# Patient Record
Sex: Female | Born: 1950 | Race: White | Hispanic: No | Marital: Married | Smoking: Former smoker
Health system: Southern US, Community
[De-identification: ages and names within clinical notes are randomized; demographics above are authoritative.]

## PROBLEM LIST (undated history)

## (undated) DIAGNOSIS — I10 Essential (primary) hypertension: Secondary | ICD-10-CM

## (undated) DIAGNOSIS — I456 Pre-excitation syndrome: Secondary | ICD-10-CM

## (undated) DIAGNOSIS — K649 Unspecified hemorrhoids: Secondary | ICD-10-CM

## (undated) DIAGNOSIS — K5900 Constipation, unspecified: Secondary | ICD-10-CM

## (undated) HISTORY — DX: Pre-excitation syndrome: I45.6

## (undated) HISTORY — DX: Essential (primary) hypertension: I10

## (undated) HISTORY — PX: COLONOSCOPY: SHX174

---

## 1992-08-04 HISTORY — PX: OTHER SURGICAL HISTORY: SHX169

## 2001-08-27 ENCOUNTER — Encounter: Admission: RE | Admit: 2001-08-27 | Discharge: 2001-09-09 | Payer: Self-pay | Admitting: Internal Medicine

## 2003-05-08 ENCOUNTER — Encounter: Payer: Self-pay | Admitting: Internal Medicine

## 2004-10-23 ENCOUNTER — Ambulatory Visit: Payer: Self-pay | Admitting: Internal Medicine

## 2004-11-18 ENCOUNTER — Ambulatory Visit: Payer: Self-pay | Admitting: Internal Medicine

## 2004-12-31 ENCOUNTER — Ambulatory Visit: Payer: Self-pay | Admitting: Gastroenterology

## 2005-01-21 ENCOUNTER — Ambulatory Visit: Payer: Self-pay | Admitting: Gastroenterology

## 2005-01-21 ENCOUNTER — Encounter: Payer: Self-pay | Admitting: Internal Medicine

## 2005-11-17 ENCOUNTER — Ambulatory Visit: Payer: Self-pay | Admitting: Internal Medicine

## 2005-11-24 ENCOUNTER — Ambulatory Visit: Payer: Self-pay | Admitting: Internal Medicine

## 2006-01-26 ENCOUNTER — Ambulatory Visit: Payer: Self-pay | Admitting: Internal Medicine

## 2006-08-14 ENCOUNTER — Ambulatory Visit: Payer: Self-pay | Admitting: Internal Medicine

## 2006-08-14 LAB — CONVERTED CEMR LAB
ALT: 33 units/L (ref 0–40)
AST: 28 units/L (ref 0–37)
Albumin: 4.1 g/dL (ref 3.5–5.2)
Alkaline Phosphatase: 78 units/L (ref 39–117)
BUN: 11 mg/dL (ref 6–23)
Basophils Absolute: 0 10*3/uL (ref 0.0–0.1)
Basophils Relative: 0.5 % (ref 0.0–1.0)
CO2: 30 meq/L (ref 19–32)
Calcium: 9.6 mg/dL (ref 8.4–10.5)
Chloride: 107 meq/L (ref 96–112)
Chol/HDL Ratio, serum: 4
Cholesterol: 230 mg/dL (ref 0–200)
Creatinine, Ser: 0.7 mg/dL (ref 0.4–1.2)
Eosinophil percent: 2.5 % (ref 0.0–5.0)
GFR calc non Af Amer: 92 mL/min
Glomerular Filtration Rate, Af Am: 112 mL/min/{1.73_m2}
Glucose, Bld: 96 mg/dL (ref 70–99)
HCT: 47 % — ABNORMAL HIGH (ref 36.0–46.0)
HDL: 56.9 mg/dL (ref >39.0)
Hemoglobin: 14.8 g/dL (ref 12.0–15.0)
LDL DIRECT: 143.6 mg/dL
Lymphocytes Relative: 42.8 % (ref 12.0–46.0)
MCHC: 31.4 g/dL (ref 30.0–36.0)
MCV: 89.2 fL (ref 78.0–100.0)
Monocytes Absolute: 0.3 10*3/uL (ref 0.2–0.7)
Monocytes Relative: 9 % (ref 3.0–11.0)
Neutro Abs: 1.7 10*3/uL (ref 1.4–7.7)
Neutrophils Relative %: 45.2 % (ref 43.0–77.0)
Platelets: 254 10*3/uL (ref 150–400)
Potassium: 3.8 meq/L (ref 3.5–5.1)
RBC: 5.27 M/uL — ABNORMAL HIGH (ref 3.87–5.11)
RDW: 12 % (ref 11.5–14.6)
Sodium: 143 meq/L (ref 135–145)
TSH: 1.1 microintl units/mL (ref 0.35–5.50)
Total Bilirubin: 1 mg/dL (ref 0.3–1.2)
Total Protein: 6.8 g/dL (ref 6.0–8.3)
Triglyceride fasting, serum: 68 mg/dL (ref 0–149)
VLDL: 14 mg/dL (ref 0–40)
WBC: 3.6 10*3/uL — ABNORMAL LOW (ref 4.5–10.5)

## 2006-08-21 ENCOUNTER — Ambulatory Visit: Payer: Self-pay | Admitting: Internal Medicine

## 2006-10-05 ENCOUNTER — Ambulatory Visit: Payer: Self-pay | Admitting: Internal Medicine

## 2007-01-15 ENCOUNTER — Encounter: Payer: Self-pay | Admitting: Internal Medicine

## 2007-01-22 ENCOUNTER — Ambulatory Visit: Payer: Self-pay | Admitting: Internal Medicine

## 2007-01-28 ENCOUNTER — Ambulatory Visit: Payer: Self-pay | Admitting: Internal Medicine

## 2007-01-28 LAB — CONVERTED CEMR LAB
BUN: 10 mg/dL (ref 6–23)
CO2: 31 meq/L (ref 19–32)
Calcium: 9.9 mg/dL (ref 8.4–10.5)
Chloride: 102 meq/L (ref 96–112)
Creatinine, Ser: 0.6 mg/dL (ref 0.4–1.2)
GFR calc Af Amer: 133 mL/min
GFR calc non Af Amer: 110 mL/min
Glucose, Bld: 76 mg/dL (ref 70–99)
Potassium: 3.9 meq/L (ref 3.5–5.1)
Sodium: 141 meq/L (ref 135–145)

## 2007-02-01 DIAGNOSIS — I10 Essential (primary) hypertension: Secondary | ICD-10-CM

## 2007-02-03 DIAGNOSIS — I456 Pre-excitation syndrome: Secondary | ICD-10-CM | POA: Insufficient documentation

## 2007-04-19 ENCOUNTER — Ambulatory Visit: Payer: Self-pay | Admitting: Internal Medicine

## 2007-04-26 ENCOUNTER — Ambulatory Visit: Payer: Self-pay | Admitting: Internal Medicine

## 2007-04-26 LAB — CONVERTED CEMR LAB
Bilirubin Urine: NEGATIVE
Blood in Urine, dipstick: NEGATIVE
Glucose, Urine, Semiquant: NEGATIVE
Ketones, urine, test strip: NEGATIVE
Nitrite: NEGATIVE
Protein, U semiquant: NEGATIVE
Specific Gravity, Urine: 1.015
Urobilinogen, UA: 0.2
WBC Urine, dipstick: NEGATIVE
pH: 7

## 2007-04-29 ENCOUNTER — Ambulatory Visit: Payer: Self-pay | Admitting: Cardiology

## 2007-05-05 ENCOUNTER — Telehealth: Payer: Self-pay | Admitting: Internal Medicine

## 2007-05-06 ENCOUNTER — Telehealth: Payer: Self-pay | Admitting: Internal Medicine

## 2007-05-17 ENCOUNTER — Telehealth: Payer: Self-pay | Admitting: Internal Medicine

## 2007-05-17 ENCOUNTER — Encounter: Admission: RE | Admit: 2007-05-17 | Discharge: 2007-05-17 | Payer: Self-pay | Admitting: Internal Medicine

## 2007-05-20 ENCOUNTER — Telehealth: Payer: Self-pay | Admitting: Internal Medicine

## 2007-05-24 ENCOUNTER — Telehealth: Payer: Self-pay | Admitting: Internal Medicine

## 2007-05-31 ENCOUNTER — Ambulatory Visit: Payer: Self-pay | Admitting: Internal Medicine

## 2007-06-04 ENCOUNTER — Encounter: Payer: Self-pay | Admitting: Internal Medicine

## 2007-06-07 ENCOUNTER — Encounter: Admission: RE | Admit: 2007-06-07 | Discharge: 2007-06-07 | Payer: Self-pay | Admitting: Internal Medicine

## 2007-06-09 ENCOUNTER — Telehealth: Payer: Self-pay | Admitting: Internal Medicine

## 2007-06-14 ENCOUNTER — Telehealth: Payer: Self-pay | Admitting: Internal Medicine

## 2007-10-20 ENCOUNTER — Ambulatory Visit: Payer: Self-pay | Admitting: Internal Medicine

## 2007-10-20 LAB — CONVERTED CEMR LAB
ALT: 19 units/L (ref 0–35)
AST: 22 units/L (ref 0–37)
Albumin: 4.3 g/dL (ref 3.5–5.2)
BUN: 13 mg/dL (ref 6–23)
Basophils Absolute: 0 10*3/uL (ref 0.0–0.1)
Bilirubin Urine: NEGATIVE
Chloride: 101 meq/L (ref 96–112)
Creatinine, Ser: 0.7 mg/dL (ref 0.4–1.2)
Direct LDL: 142.4 mg/dL
Eosinophils Absolute: 0.1 10*3/uL (ref 0.0–0.6)
GFR calc non Af Amer: 92 mL/min
Glucose, Bld: 91 mg/dL (ref 70–99)
HCT: 43.3 % (ref 36.0–46.0)
Hemoglobin: 14.5 g/dL (ref 12.0–15.0)
Ketones, urine, test strip: NEGATIVE
MCV: 88.3 fL (ref 78.0–100.0)
Monocytes Relative: 8.6 % (ref 3.0–11.0)
Nitrite: NEGATIVE
Potassium: 4.3 meq/L (ref 3.5–5.1)
Protein, U semiquant: NEGATIVE
RBC: 4.9 M/uL (ref 3.87–5.11)
Specific Gravity, Urine: 1.01
WBC Urine, dipstick: NEGATIVE
WBC: 3.4 10*3/uL — ABNORMAL LOW (ref 4.5–10.5)

## 2007-10-27 ENCOUNTER — Ambulatory Visit: Payer: Self-pay | Admitting: Internal Medicine

## 2008-02-10 ENCOUNTER — Encounter: Payer: Self-pay | Admitting: Internal Medicine

## 2008-04-17 ENCOUNTER — Telehealth: Payer: Self-pay | Admitting: Internal Medicine

## 2008-07-26 ENCOUNTER — Ambulatory Visit: Payer: Self-pay | Admitting: Internal Medicine

## 2008-07-26 LAB — CONVERTED CEMR LAB
Protein, U semiquant: NEGATIVE
Urobilinogen, UA: 0.2
pH: 7

## 2008-07-27 ENCOUNTER — Encounter: Payer: Self-pay | Admitting: Internal Medicine

## 2008-08-04 LAB — HM PAP SMEAR

## 2008-10-20 ENCOUNTER — Ambulatory Visit: Payer: Self-pay | Admitting: Internal Medicine

## 2008-10-20 LAB — CONVERTED CEMR LAB
Alkaline Phosphatase: 63 units/L (ref 39–117)
Basophils Absolute: 0 10*3/uL (ref 0.0–0.1)
Bilirubin, Direct: 0 mg/dL (ref 0.0–0.3)
Calcium: 9.4 mg/dL (ref 8.4–10.5)
Cholesterol: 233 mg/dL — ABNORMAL HIGH (ref 0–200)
Direct LDL: 160 mg/dL
Glucose, Bld: 90 mg/dL (ref 70–99)
Glucose, Urine, Semiquant: NEGATIVE
HCT: 42.6 % (ref 36.0–46.0)
Hemoglobin: 14.9 g/dL (ref 12.0–15.0)
Ketones, urine, test strip: NEGATIVE
Lymphocytes Relative: 45.4 % (ref 12.0–46.0)
Lymphs Abs: 1.6 10*3/uL (ref 0.7–4.0)
MCV: 86.4 fL (ref 78.0–100.0)
Monocytes Absolute: 0.3 10*3/uL (ref 0.1–1.0)
Neutro Abs: 1.6 10*3/uL (ref 1.4–7.7)
Neutrophils Relative %: 44.6 % (ref 43.0–77.0)
Nitrite: NEGATIVE
Platelets: 183 10*3/uL (ref 150.0–400.0)
Potassium: 3.6 meq/L (ref 3.5–5.1)
RBC: 4.93 M/uL (ref 3.87–5.11)
TSH: 0.94 microintl units/mL (ref 0.35–5.50)
Total Bilirubin: 1 mg/dL (ref 0.3–1.2)
Total CHOL/HDL Ratio: 4
Total Protein: 6.9 g/dL (ref 6.0–8.3)
Triglycerides: 73 mg/dL (ref 0.0–149.0)

## 2008-10-23 ENCOUNTER — Ambulatory Visit: Payer: Self-pay | Admitting: Internal Medicine

## 2009-01-11 ENCOUNTER — Telehealth: Payer: Self-pay | Admitting: Internal Medicine

## 2009-02-15 ENCOUNTER — Encounter: Payer: Self-pay | Admitting: Internal Medicine

## 2009-03-22 ENCOUNTER — Ambulatory Visit: Payer: Self-pay | Admitting: Internal Medicine

## 2009-08-04 LAB — HM MAMMOGRAPHY: HM Mammogram: NORMAL

## 2009-10-09 ENCOUNTER — Ambulatory Visit: Payer: Self-pay | Admitting: Internal Medicine

## 2009-10-09 LAB — CONVERTED CEMR LAB
AST: 25 units/L (ref 0–37)
BUN: 11 mg/dL (ref 6–23)
Basophils Relative: 1.4 % (ref 0.0–3.0)
Bilirubin Urine: NEGATIVE
Calcium: 9.8 mg/dL (ref 8.4–10.5)
Cholesterol: 232 mg/dL — ABNORMAL HIGH (ref 0–200)
Direct LDL: 157 mg/dL
Eosinophils Absolute: 0.1 10*3/uL (ref 0.0–0.7)
Eosinophils Relative: 2 % (ref 0.0–5.0)
Glucose, Bld: 89 mg/dL (ref 70–99)
HDL: 62 mg/dL (ref >39.00)
Ketones, urine, test strip: NEGATIVE
Lymphocytes Relative: 40.1 % (ref 12.0–46.0)
MCHC: 32.6 g/dL (ref 30.0–36.0)
Monocytes Absolute: 0.3 10*3/uL (ref 0.1–1.0)
Neutro Abs: 1.7 10*3/uL (ref 1.4–7.7)
Neutrophils Relative %: 49.4 % (ref 43.0–77.0)
Platelets: 198 10*3/uL (ref 150.0–400.0)
Potassium: 4.2 meq/L (ref 3.5–5.1)
Total Bilirubin: 0.7 mg/dL (ref 0.3–1.2)
Triglycerides: 110 mg/dL (ref 0.0–149.0)
VLDL: 22 mg/dL (ref 0.0–40.0)
pH: 7

## 2009-10-24 ENCOUNTER — Ambulatory Visit: Payer: Self-pay | Admitting: Internal Medicine

## 2010-01-03 ENCOUNTER — Telehealth: Payer: Self-pay | Admitting: Internal Medicine

## 2010-01-03 ENCOUNTER — Ambulatory Visit: Payer: Self-pay | Admitting: Internal Medicine

## 2010-01-04 ENCOUNTER — Telehealth: Payer: Self-pay | Admitting: Internal Medicine

## 2010-01-24 ENCOUNTER — Ambulatory Visit: Payer: Self-pay | Admitting: Internal Medicine

## 2010-02-18 ENCOUNTER — Encounter: Payer: Self-pay | Admitting: Internal Medicine

## 2010-03-06 ENCOUNTER — Encounter (INDEPENDENT_AMBULATORY_CARE_PROVIDER_SITE_OTHER): Payer: Self-pay | Admitting: *Deleted

## 2010-04-04 ENCOUNTER — Ambulatory Visit: Payer: Self-pay | Admitting: Internal Medicine

## 2010-04-04 DIAGNOSIS — M549 Dorsalgia, unspecified: Secondary | ICD-10-CM | POA: Insufficient documentation

## 2010-04-04 LAB — CONVERTED CEMR LAB
Blood in Urine, dipstick: NEGATIVE
Glucose, Urine, Semiquant: NEGATIVE
Urobilinogen, UA: 0.2
WBC Urine, dipstick: NEGATIVE
pH: 7

## 2010-04-11 ENCOUNTER — Ambulatory Visit: Payer: Self-pay | Admitting: Internal Medicine

## 2010-04-12 ENCOUNTER — Encounter: Payer: Self-pay | Admitting: Internal Medicine

## 2010-05-01 ENCOUNTER — Ambulatory Visit: Payer: Self-pay | Admitting: Internal Medicine

## 2010-05-02 ENCOUNTER — Encounter: Payer: Self-pay | Admitting: Internal Medicine

## 2010-05-14 ENCOUNTER — Encounter: Payer: Self-pay | Admitting: Internal Medicine

## 2010-05-30 ENCOUNTER — Encounter: Payer: Self-pay | Admitting: Internal Medicine

## 2010-06-18 ENCOUNTER — Ambulatory Visit: Payer: Self-pay | Admitting: Internal Medicine

## 2010-08-20 ENCOUNTER — Ambulatory Visit
Admission: RE | Admit: 2010-08-20 | Discharge: 2010-08-20 | Payer: Self-pay | Source: Home / Self Care | Attending: Family Medicine | Admitting: Family Medicine

## 2010-08-21 ENCOUNTER — Ambulatory Visit
Admission: RE | Admit: 2010-08-21 | Discharge: 2010-08-21 | Payer: Self-pay | Source: Home / Self Care | Attending: Family Medicine | Admitting: Family Medicine

## 2010-08-22 ENCOUNTER — Telehealth: Payer: Self-pay | Admitting: Internal Medicine

## 2010-09-01 LAB — CONVERTED CEMR LAB
Pap Smear: NORMAL
Pap Smear: NORMAL

## 2010-09-03 NOTE — Progress Notes (Signed)
Summary: cancel heart monitor  Phone Note Call from Patient Call back at Home Phone [phone]   Summary of Call: Has figured out that she is having panic attacks.  She wants to cancel heart monitor.  Major family issues - husband, dad, son.  Doesn't want strong med, will try to let go of it, exercise, eat right.  Your advice?    Initial call taken by: Rudy Jew, RN,  January 04, 2010 9:05 AM  Follow-up for Phone Call        if sxs are not bothering her, ok to cancel Follow-up by: Birdie Sons MD,  January 04, 2010 10:12 AM  Additional Follow-up for Phone Call Additional follow up Details #1::        She will call cardiology & cancel appointment. Additional Follow-up by: Rudy Jew, RN,  January 04, 2010 10:23 AM

## 2010-09-03 NOTE — Assessment & Plan Note (Signed)
Summary: ?uti/cjr   Vital Signs:  Patient profile:   60 year old female Temp:     98.2 degrees F oral Pulse rate:   72 / minute Pulse rhythm:   regular Resp:     12 per minute BP sitting:   148 / 94  (left arm) Cuff size:   regular  Vitals Entered By: Gladis Riffle, RN (April 04, 2010 9:16 AM) CC: c/o mid back pain that has been better last couple days, lack of energy, and urinary frequency Is Patient Diabetic? No   CC:  c/o mid back pain that has been better last couple days, lack of energy, and and urinary frequency.  History of Present Illness: concerned with uti--- started with back pain---mid low back (duration 2 weeks, intermittent) pain was mild to moderate---she thought MSK 2 days ago she felt poorly, wondered about uti. 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 Once Daily 2)  Lisinopril 5 Mg Tabs (Lisinopril) .... Once Daily 3)  Oscal 500/200 D-3 500-200 Mg-Unit Tabs (Calcium-Vitamin D) .... Two Times A Day 4)  Flax Seed Oil 1000 Mg  Caps (Flaxseed (Linseed)) .... 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   Head:  normocephalic and atraumatic.   Msk:  No deformity or scoliosis noted of thoracic or lumbar spine.   slr negative  Neurologic:  lower ext strength normal dtrs normal---bilateral LLE   Impression & Recommendations:  Problem # 1:  BACK PAIN (ICD-724.5) suspect MSk pain doubt uti but will check culture Orders: T-Culture, Urine ([id number])  Complete Medication List: 1)  Fish Oil 1000 Mg Caps (Omega-3 fatty acids) .... Once daily 2)  Lisinopril 5 Mg Tabs (Lisinopril) .... Once daily 3)  Oscal 500/200 D-3 500-200 Mg-unit Tabs (Calcium-vitamin d) .... Two times a day 4)  Flax Seed Oil 1000 Mg Caps (Flaxseed (linseed)) .... Once daily--takes 1200mg   Other Orders: UA Dipstick w/o Micro (automated)  (81003)  Laboratory Results   Urine Tests    Routine Urinalysis   Color: yellow Appearance: Clear Glucose: negative   (Normal Range: Negative) Bilirubin: negative   (Normal Range: Negative) Ketone: negative   (Normal Range: Negative) Spec. Gravity: 1.010   (Normal Range: 1.003-1.035) Blood: negative   (Normal Range: Negative) pH: 7.0   (Normal Range: 5.0-8.0) Protein: negative   (Normal Range: Negative) Urobilinogen: 0.2   (Normal Range: 0-1) Nitrite: negative   (Normal Range: Negative) Leukocyte Esterace: negative   (Normal Range: Negative)    Comments: Rita Ohara  April 04, 2010 10:13 AM

## 2010-09-03 NOTE — Assessment & Plan Note (Signed)
Summary: cpx/cjr   Vital Signs:  Patient profile:   60 year old female Height:      67.25 inches Weight:      156 pounds BMI:     24.34 Temp:     98.6 degrees F oral Pulse rate:   66 / minute BP sitting:   140 / 94  (left arm) Cuff size:   regular  Vitals Entered By: Kern Reap CMA Duncan Dull) (October 24, 2009 9:05 AM) CC: yearly physical Is Patient Diabetic? No Pain Assessment Patient in pain? no        CC:  yearly physical.  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 Take 1 Once A Day 2)  Lisinopril 5 Mg Tabs (Lisinopril) .... Once Daily 3)  Oscal 500/200 D-3 500-200 Mg-Unit Tabs (Calcium-Vitamin D) .... Two Times A Day 4)  Flax Seed Oil 1000 Mg  Caps (Flaxseed (Linseed)) .... 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   Head:  normocephalic and atraumatic.   Eyes:  pupils equal and pupils round.   Ears:  R ear normal and L ear normal.   Neck:  supple and full ROM.   Chest Wall:  No deformities, masses, or tenderness noted. Lungs:  normal respiratory effort  and no intercostal retractions.   Heart:  normal rate and regular rhythm.   Abdomen:  soft and non-tender.   Msk:  No deformity or scoliosis noted of thoracic or lumbar spine.   Pulses:  R radial normal and L radial normal.   Neurologic:  cranial nerves II-XII intact and gait normal.   Skin:  turgor normal and color normal.   Cervical Nodes:  no anterior cervical adenopathy and no posterior cervical adenopathy.   Psych:  memory intact for recent and remote and good eye contact.     Impression & Recommendations:  Problem # 1:  PREVENTIVE HEALTH CARE (ICD-V70.0) health maint UTD continue to be active  Problem # 2:  WOLFF (WOLFE)-PARKINSON-WHITE (WPW) SYNDROME (ICD-426.7) asymptomatic  Problem # 3:  HYPERTENSION (ICD-401.9) home bps 120/60s Her updated medication list for this problem includes:    Lisinopril 5 Mg Tabs (Lisinopril) ..... Once daily  Orders: EKG w/ Interpretation (93000)  BP today: 140/94 Prior BP: 142/88 (03/22/2009)  Prior 10 Yr Risk Heart Disease: Not enough information (03/22/2009)  Labs Reviewed: K+: 4.2 (10/09/2009) Creat: : 0.7 (10/09/2009)   Chol: 232 (10/09/2009)   HDL: 62.00 (10/09/2009)   LDL: DEL (10/20/2007)   TG: 110.0 (10/09/2009)  Complete Medication List: 1)  Fish Oil 500 Mg Caps (Omega-3 fatty acids) .... Take 1 once a day 2)  Lisinopril 5 Mg Tabs (Lisinopril) .... Once daily 3)  Oscal 500/200 D-3 500-200 Mg-unit Tabs (Calcium-vitamin d) .... Two times a  day 4)  Flax Seed Oil 1000 Mg Caps (Flaxseed (linseed)) .... Once daily--takes 1200mg    Immunization History:  Influenza Immunization History:    Influenza:  historical (06/04/2009)    Preventive Care Screening  Colonoscopy:    Next Due:  02/2015  Mammogram:    Date:  08/04/2009    Next Due:  08/2011    Results:  normal   Pap Smear:    Date:  08/04/2009    Next Due:  08/2012    Results:  normal

## 2010-09-03 NOTE — Assessment & Plan Note (Signed)
Summary: ? heart palpitations--ok per Ellen//ccm   Vital Signs:  Patient profile:   60 year old female Weight:      156 pounds Temp:     98.3 degrees F oral Pulse rate:   76 / minute Pulse rhythm:   regular Resp:     12 per minute BP sitting:   132 / 80  (left arm) Cuff size:   regular  Vitals Entered By: Gladis Riffle, RN (January 03, 2010 11:19 AM) CC: husband with brain tumor in April 2011, other problems with him so stressed--now feels nausea, sweaty and tachycardia on 12/29/09--tachycardic in AM x 1 min but lasting longer lately Is Patient Diabetic? No Comments today weird sensations and feeling of heart acting weird   CC:  husband with brain tumor in April 2011, other problems with him so stressed--now feels nausea, and sweaty and tachycardia on 12/29/09--tachycardic in AM x 1 min but lasting longer lately.  History of Present Illness: pt with known WPW by EKG.  Over the past few months she has noted increased frequency and duration of palpitations. No CP, SOB, PND, orhtopnea.   Preventive Screening-Counseling & Management  Alcohol-Tobacco     Smoking Status: never     Year Quit: 1980  Current Medications (verified): 1)  Fish Oil 1000 Mg Caps (Omega-3 Fatty Acids) .... Once Daily 2)  Lisinopril 5 Mg Tabs (Lisinopril) .... Once Daily 3)  Oscal 500/200 D-3 500-200 Mg-Unit Tabs (Calcium-Vitamin D) .... Two Times A Day 4)  Flax Seed Oil 1000 Mg  Caps (Flaxseed (Linseed)) .... Once Daily--Takes 1200mg   Allergies: 1)  Bactrim (Sulfamethoxazole-Trimethoprim)  Physical Exam  General:  alert and well-developed.   Head:  normocephalic and atraumatic.   Neck:  supple and full ROM.   Chest Wall:  No deformities, masses, or tenderness noted. Lungs:  normal respiratory effort and no intercostal retractions.   Heart:  normal rate and regular rhythm.   Abdomen:  soft and non-tender.     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 Once daily 2)  Lisinopril 5 Mg Tabs (Lisinopril) .... Once daily 3)  Oscal 500/200 D-3 500-200 Mg-unit Tabs (Calcium-vitamin d) .... Two times a day 4)  Flax Seed Oil 1000 Mg Caps (Flaxseed (linseed)) .... Once daily--takes 1200mg 

## 2010-09-03 NOTE — Progress Notes (Signed)
Summary: needs ov today  Phone Note Call from Patient Call back at Home Phone [phone]   Caller: Patient--live call Summary of Call: concerned about  her heart. Wants to be worked in to see Dr Cato Mulligan today. Please advise. Pt has a heart condition called WPW, which causes a rapid heartbeat. Started on last Saturday. Today, it hasn't stopped beating fast. Initial call taken by: Warnell Forester,  January 03, 2010 8:44 AM  Follow-up for Phone Call        per Alvino Chapel. can see Dr Cato Mulligan today @ 11:30 am Pt is aware. Follow-up by: Warnell Forester,  January 03, 2010 9:00 AM

## 2010-09-03 NOTE — Letter (Signed)
Summary: Colonoscopy Letter  [facility]  [address] [place], [place] [address]   Phone: [phone]  Fax: [phone]      March 06, 2010 MRN: [id number]   Christine Livingston [address] [place], [place]  [address]   Dear Ms. Christine Livingston,   According to your medical record, it is time for you to schedule a Colonoscopy. The American Cancer Society recommends this procedure as a method to detect early colon cancer. Patients with a family history of colon cancer, or a personal history of colon polyps or inflammatory bowel disease are at increased risk.  This letter has been generated based on the recommendations made at the time of your procedure. If you feel that in your particular situation this may no longer apply, please contact our office.  Please call our office at [phone] to schedule this appointment or to update your records at your earliest convenience.  Thank you for cooperating with [place] to provide you with the very best care possible.   Sincerely,   Barbette Hair. Arlyce Dice, M.D.  [facility] [phone]

## 2010-09-03 NOTE — Assessment & Plan Note (Signed)
Summary: COUGH, BACK PAIN // RS   Vital Signs:  Patient profile:   60 year old female Weight:      158 pounds BMI:     24.65 Temp:     98.5 degrees F oral Pulse rate:   76 / minute Pulse rhythm:   regular Resp:     12 per minute BP sitting:   130 / 78  (left arm) Cuff size:   regular  Vitals Entered By: Gladis Riffle, RN (January 24, 2010 9:39 AM) CC: c/o dry cough, has worsened and moved deeper in chest--also back pain with cough Is Patient Diabetic? No   CC:  c/o dry cough and has worsened and moved deeper in chest--also back pain with cough.  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 Once Daily 2)  Lisinopril 5 Mg Tabs (Lisinopril) .... Once Daily 3)  Oscal 500/200 D-3 500-200 Mg-Unit Tabs (Calcium-Vitamin D) .... Two Times A Day 4)  Flax Seed Oil 1000 Mg  Caps (Flaxseed (Linseed)) .... 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   Head:  normocephalic and atraumatic.   Eyes:  pupils equal and pupils round.   Ears:  R ear normal and L ear normal.   Nose:  no external deformity and no external erythema.   Neck:  supple and full ROM.   Breasts:  No mass, nodules, thickening, tenderness, bulging, retraction, inflamation, nipple discharge or skin changes noted.   Lungs:  Normal respiratory effort, chest expands symmetrically. Lungs are clear to auscultation, no crackles or wheezes. Heart:  normal rate and regular rhythm.   Abdomen:  Bowel sounds positive,abdomen soft and non-tender without masses, organomegaly or hernias noted. Msk:  No deformity or scoliosis noted of thoracic or lumbar spine.   Neurologic:  cranial nerves II-XII intact and gait normal.     Impression & Recommendations:  Problem # 1:  COUGH (ICD-786.2) suspect URI/allergic mucinex dm two times a day call if sxs persist note ACE-I  Complete Medication List: 1)  Fish Oil 1000 Mg Caps (Omega-3 fatty acids) .... Once daily 2)  Lisinopril 5 Mg Tabs (Lisinopril) .... Once daily 3)  Oscal 500/200 D-3 500-200 Mg-unit Tabs (Calcium-vitamin d) .... Two times a day 4)  Flax Seed Oil 1000 Mg Caps (Flaxseed (linseed)) .... Once daily--takes 1200mg  5)  Hydrocodone-homatropine 5-1.5 Mg/25ml Syrp (Hydrocodone-homatropine) .[name].. 1 tsp three times a day as needed coug Prescriptions: HYDROCODONE-HOMATROPINE 5-1.5 MG/5ML SYRP (HYDROCODONE-HOMATROPINE) 1 tsp three times a day as needed coug  #240 cc x 0  Entered and Authorized by:   Birdie Sons MD   Signed by:   Birdie Sons MD on 01/24/2010   Method used:   Print then Give to Patient   RxID:   [id number] LISINOPRIL 5 MG TABS (LISINOPRIL) once daily  #30 x 11   Entered by:   Gladis Riffle, RN   Authorized by:   Birdie Sons MD   Signed by:   Gladis Riffle, RN on 01/24/2010   Method used:   Electronically to        [facility]   [phone]* (retail)       [address]       [place], [place]  [address]       Ph: [phone] or [phone]       Fax: [phone]   RxID:   [id number]

## 2010-09-03 NOTE — Letter (Signed)
Summary: Annual Exam/[facility]  Annual Exam/[facility]   Imported By: Maryln Gottron 05/27/2010 10:05:19  _____________________________________________________________________  External Attachment:    Type:   Image     Comment:   External Document

## 2010-09-03 NOTE — Miscellaneous (Signed)
Summary: Flu Shot/[facility]  Flu Shot/[facility]   Imported By: Maryln Gottron 07/03/2010 12:58:40  _____________________________________________________________________  External Attachment:    Type:   Image     Comment:   External Document

## 2010-09-05 NOTE — Assessment & Plan Note (Signed)
Summary: periodic back pain that is severe at times/cjr   Vital Signs:  Patient profile:   60 year old female Weight:      158 pounds Temp:     98.2 degrees F oral BP sitting:   140 / 90  (left arm) Cuff size:   regular  Vitals Entered By: Duard Brady LPN (August 21, 2010 9:40 AM) CC: back pain worse  Is Patient Diabetic? No   History of Present Illness: Refer to note yesterday.  Worsening back pain L lower thoracic UA unremarkable.  Took cyclobenzaprine and did sleep better last night Tried heat again with no relief.  Pain L side and sore to touch and worse with movement. No rash  No fever.  No pleuritic pain.  Somewhat similar pain in past  with no clear trigger.  Allergies: 1)  Bactrim (Sulfamethoxazole-Trimethoprim)  Past History:  Past Medical History: Last updated: 02/03/2007 Hypertension WPW PMH reviewed for relevance  Review of Systems  The patient denies anorexia, fever, weight loss, chest pain, syncope, dyspnea on exertion, peripheral edema, prolonged cough, hemoptysis, abdominal pain, hematuria, and suspicious skin lesions.    Physical Exam  General:  Well-developed,well-nourished,in no acute distress; alert,appropriate and cooperative throughout examination Neck:  No deformities, masses, or tenderness noted. Lungs:  Normal respiratory effort, chest expands symmetrically. Lungs are clear to auscultation, no crackles or wheezes. Heart:  normal rate and regular rhythm.   Msk:  tender L lower thoracic muscles. no visible edema or ecchymoses.   Impression & Recommendations:  Problem # 1:  BACK PAIN (ICD-724.5) suspect muscular.  Add Naproxen and f/u Dr Cato Mulligan if no better 1-2 weeks. Her updated medication list for this problem includes:    Cyclobenzaprine Hcl 5 Mg Tabs (Cyclobenzaprine hcl) ..... One by mouth q 8 hours as needed back pain    Naproxen 500 Mg Tabs (Naproxen) ..... One by mouth two times a day with food as needed  Complete  Medication List: 1)  Fish Oil 1000 Mg Caps (Omega-3 fatty acids) .... Once daily 2)  Lisinopril 5 Mg Tabs (Lisinopril) .... Once daily 3)  Oscal 500/200 D-3 500-200 Mg-unit Tabs (Calcium-vitamin d) .... Two times a day 4)  Flax Seed Oil 1000 Mg Caps (Flaxseed (linseed)) .... Once daily--takes 1200mg  5)  Cyclobenzaprine Hcl 5 Mg Tabs (Cyclobenzaprine hcl) .... One by mouth q 8 hours as needed back pain 6)  Naproxen 500 Mg Tabs (Naproxen) .... One by mouth two times a day with food as needed  Patient Instructions: 1)  Follow up with Dr Cato Mulligan if pain no better next couple of weeks. Prescriptions: NAPROXEN 500 MG TABS (NAPROXEN) one by mouth two times a day with food as needed  #30 x 0   Entered and Authorized by:   Evelena Peat MD   Signed by:   Evelena Peat MD on 08/21/2010   Method used:   Electronically to        [facility]  [phone]* (retail)       [address]       [place], [place]  [address]       Ph: [phone] or [phone]       Fax: [phone]   RxID:   [id number]    Orders Added: 1)  Est. Patient Level III [36644]

## 2010-09-05 NOTE — Assessment & Plan Note (Signed)
Summary: UTI? // RS   Vital Signs:  Patient profile:   60 year old female Temp:     98.0 degrees F oral BP sitting:   160 / 100  (left arm) Cuff size:   regular  Vitals Entered By: Sid Falcon LPN (August 20, 2010 9:45 AM)  Serial Vital Signs/Assessments:  Time      Position  BP       Pulse  Resp  Temp     By                     164/90                         Evelena Peat MD   History of Present Illness: Last week felt fatigued.  L mid flank pain. Achy quality.  She though muscular.  She had been lifting some boxes.  Heat with ?mild relief.  though possible UTI and started cranberry juice and Ibuprofen and pain improved.    Pain some better today.  Not aggravated by movement.  No dysuria.  No fever.  No hx of kidney stones.  Hypertension treated with low dose lisninopril .  Compliant with therapy.  Hypertension History:      She denies headache, chest pain, palpitations, dyspnea with exertion, orthopnea, peripheral edema, visual symptoms, neurologic problems, and syncope.  She notes no problems with any antihypertensive medication side effects.        Positive major cardiovascular risk factors include female age 35 years old or older and hypertension.  Negative major cardiovascular risk factors include non-tobacco-user status.     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    Physical Exam  General:  Well-developed,well-nourished,in no acute distress; alert,appropriate and cooperative throughout examination Head:  Normocephalic and atraumatic without obvious abnormalities. No apparent alopecia or balding. Eyes:  pupils equal, pupils round, and pupils reactive to light.   Mouth:  Oral mucosa and oropharynx without lesions or exudates.  Teeth in good repair. Neck:  No deformities, masses, or tenderness noted. Lungs:  Normal respiratory effort, chest expands symmetrically. Lungs are clear to auscultation, no crackles or wheezes. Heart:  Normal rate and regular rhythm. S1 and S2 normal without gallop, murmur, click, rub or other extra sounds. Abdomen:  soft, non-tender, normal bowel sounds, no distention, and no masses.   Msk:  No deformity or scoliosis noted of thoracic or lumbar spine.  no back tenderness at this time. Extremities:  No clubbing, cyanosis, edema, or deformity noted with normal full range of motion of all joints.   Skin:  no rashes.   Cervical Nodes:  No lymphadenopathy noted   Impression & Recommendations:  Problem # 1:  BACK PAIN (ICD-724.5) suspect MSK.  UA normal.  Low dose muscle relaxer and heat and short term NSAID. Orders: UA Dipstick w/o Micro (manual) (16109)  Her updated medication list for this problem  includes:    Cyclobenzaprine Hcl 5 Mg Tabs (Cyclobenzaprine hcl) ..... One by mouth q 8 hours as needed back pain  Problem # 2:  HYPERTENSION (ICD-401.9) Assessment: Deteriorated pt will monitor at home.  Follow up wiht Dr Cato Mulligan in March and sooner if remains up above goal. Her updated medication list for this problem includes:    Lisinopril 5 Mg Tabs (Lisinopril) ..... Once daily  Her updated medication list for this problem includes:     Lisinopril 5 Mg Tabs (Lisinopril) ..... Once daily  Complete Medication List: 1)  Fish Oil 1000 Mg Caps (Omega-3 fatty acids) .... Once daily 2)  Lisinopril 5 Mg Tabs (Lisinopril) .... Once daily 3)  Oscal 500/200 D-3 500-200 Mg-unit Tabs (Calcium-vitamin d) .... Two times a day 4)  Flax Seed Oil 1000 Mg Caps (Flaxseed (linseed)) .... Once daily--takes 1200mg  5)  Cipro 250 Mg Tabs (Ciprofloxacin hcl) .... One by mouth two times a day x 5 days 6)  Cyclobenzaprine Hcl 5 Mg Tabs (Cyclobenzaprine hcl) .... One by mouth q 8 hours as needed back pain  Hypertension Assessment/Plan:      The patient's hypertensive risk group is category B: At least one risk factor (excluding diabetes) with no target organ damage.  Today's blood pressure is 160/100.    Patient Instructions: 1)  Check your  Blood Pressure regularly . If it is above: 140/90  you should make an appointment. 2)  Be sure to keep appt with Dr Cato Mulligan in March and sooner if BP is elevating before then. Prescriptions: CYCLOBENZAPRINE HCL 5 MG TABS (CYCLOBENZAPRINE HCL) one by mouth q 8 hours as needed back pain  #20 x 0   Entered and Authorized by:   Evelena Peat MD   Signed by:   Evelena Peat MD on 08/20/2010   Method used:   Electronically to        [facility]  [phone]* (retail)       [address]       [place], [place]  [address]       Ph: [phone] or [phone]       Fax: [phone]   RxID:   [id number]    Orders Added: 1)  UA Dipstick w/o Micro (manual) [81002] 2)  Est. Patient Level IV [36644]  Appended Document: UTI? // RS     Clinical Lists Changes  Observations: Added new observation of COMMENTS: Sid Falcon LPN  August 20, 2010 10:36 AM  (08/20/2010 10:35) Added new observation of UA COLOR: yellow (08/20/2010 10:35) Added new observation of APPEARANCE U: Clear (08/20/2010 10:35) Added new observation of SPEC GR URIN: <1.005 (08/20/2010 10:35) Added new  observation of PH URINE: 8.0  (08/20/2010 10:35) Added new observation of WBC DIPSTK U: negative  (08/20/2010 10:35) Added new observation of NITRITE URN: negative  (08/20/2010 10:35) Added new observation of UROBILINOGEN: 0.2  (08/20/2010 10:35) Added new observation of PROTEIN, URN: negative  (08/20/2010 10:35) Added new observation of BLOOD UR DIP: negative  (08/20/2010 10:35) Added new observation of KETONES URN: negative  (08/20/2010 10:35) Added new observation of BILIRUBIN UR: negative  (08/20/2010 10:35) Added new observation of GLUCOSE, URN: negative  (08/20/2010 10:35)      Laboratory Results   Urine Tests    Routine Urinalysis   Color: yellow Appearance: Clear Glucose: negative   (Normal Range: Negative) Bilirubin: negative   (Normal Range: Negative) Ketone: negative   (Normal Range: Negative) Spec. Gravity: <1.005   (  Normal Range: 1.003-1.035) Blood: negative   (Normal Range: Negative) pH: 8.0   (Normal Range: 5.0-8.0) Protein: negative   (Normal Range: Negative) Urobilinogen: 0.2   (Normal Range: 0-1) Nitrite: negative   (Normal Range: Negative) Leukocyte Esterace: negative   (Normal Range: Negative)    Comments: Sid Falcon LPN  August 20, 2010 10:36 AM

## 2010-09-05 NOTE — Progress Notes (Signed)
Summary: Pt still having back pain. Req phone call from Dr. Cato Mulligan  Phone Note Call from Patient Call back at Home Phone [phone]   Caller: Patient Summary of Call: Pt is still having  having back pain and has been in for ovs this week to see Dr. Caryl Never, but pt is req that Dr. Cato Mulligan call her asap re: this back pain.  Initial call taken by: Lucy Antigua,  August 22, 2010 11:21 AM  Follow-up for Phone Call        Lt flank pain seen Dr Caryl Never twice, pain med is helping, urine was clear. No pain today.  Does she need to f/u with you?  She is worried if she stops the pain med then then the pain will come back  Follow-up by: Alfred Levins, CMA,  August 23, 2010 4:59 PM  Additional Follow-up for Phone Call Additional follow up Details #1::         I think is reasonable to discontinue the pain medications. If her pain recurs she may need to see a spine specialist or have more specialized imaging. Additional Follow-up by: Birdie Sons MD,  August 26, 2010 4:35 PM    Additional Follow-up for Phone Call Additional follow up Details #2::    pt stopped taking pain med yesterday and she is not currently having any pain.  She said she thinks the Naproxen was causing the sores on her tongue and mouth.  She has quit taking it and will call if she needs referral Follow-up by: Alfred Levins, CMA,  August 27, 2010 8:01 AM

## 2010-10-22 ENCOUNTER — Other Ambulatory Visit (INDEPENDENT_AMBULATORY_CARE_PROVIDER_SITE_OTHER): Payer: BC Managed Care – PPO | Admitting: Internal Medicine

## 2010-10-22 DIAGNOSIS — E785 Hyperlipidemia, unspecified: Secondary | ICD-10-CM

## 2010-10-22 DIAGNOSIS — Z0000 Encounter for general adult medical examination without abnormal findings: Secondary | ICD-10-CM

## 2010-10-22 LAB — CBC WITH DIFFERENTIAL/PLATELET
Eosinophils Relative: 1.9 % (ref 0.0–5.0)
Hemoglobin: 14.5 g/dL (ref 12.0–15.0)
MCHC: 34.4 g/dL (ref 30.0–36.0)
MCV: 87.8 fl (ref 78.0–100.0)
Monocytes Absolute: 0.3 10*3/uL (ref 0.1–1.0)
Monocytes Relative: 8 % (ref 3.0–12.0)
RBC: 4.8 Mil/uL (ref 3.87–5.11)
RDW: 13.1 % (ref 11.5–14.6)
WBC: 3.9 10*3/uL — ABNORMAL LOW (ref 4.5–10.5)

## 2010-10-22 LAB — BASIC METABOLIC PANEL
BUN: 13 mg/dL (ref 6–23)
CO2: 31 mEq/L (ref 19–32)
Calcium: 9.3 mg/dL (ref 8.4–10.5)
Creatinine, Ser: 0.7 mg/dL (ref 0.4–1.2)
GFR: 93.94 mL/min (ref >60.00)
Potassium: 4.3 mEq/L (ref 3.5–5.1)

## 2010-10-22 LAB — POCT URINALYSIS DIPSTICK
Bilirubin, UA: NEGATIVE
Nitrite, UA: NEGATIVE
Protein, UA: NEGATIVE
Urobilinogen, UA: 0.2
pH, UA: 7

## 2010-10-22 LAB — HEPATIC FUNCTION PANEL
Albumin: 4.4 g/dL (ref 3.5–5.2)
Alkaline Phosphatase: 64 U/L (ref 39–117)
Total Bilirubin: 0.9 mg/dL (ref 0.3–1.2)
Total Protein: 6.8 g/dL (ref 6.0–8.3)

## 2010-10-22 LAB — LDL CHOLESTEROL, DIRECT: Direct LDL: 164.9 mg/dL

## 2010-10-22 LAB — LIPID PANEL
Cholesterol: 244 mg/dL — ABNORMAL HIGH (ref 0–200)
HDL: 56.1 mg/dL (ref >39.00)

## 2010-10-22 LAB — TSH: TSH: 1.37 u[IU]/mL (ref 0.35–5.50)

## 2010-10-28 ENCOUNTER — Encounter: Payer: Self-pay | Admitting: Internal Medicine

## 2010-10-29 ENCOUNTER — Encounter: Payer: Self-pay | Admitting: Internal Medicine

## 2010-10-29 ENCOUNTER — Ambulatory Visit (INDEPENDENT_AMBULATORY_CARE_PROVIDER_SITE_OTHER)
Admission: RE | Admit: 2010-10-29 | Discharge: 2010-10-29 | Disposition: A | Discharge status: Home / Self Care | Payer: BC Managed Care – PPO | Source: Ambulatory Visit | Attending: Internal Medicine | Admitting: Internal Medicine

## 2010-10-29 ENCOUNTER — Ambulatory Visit (INDEPENDENT_AMBULATORY_CARE_PROVIDER_SITE_OTHER): Payer: BC Managed Care – PPO | Admitting: Internal Medicine

## 2010-10-29 VITALS — BP 156/102 | HR 88 | Temp 97.8°F | Ht 67.0 in | Wt 157.0 lb

## 2010-10-29 DIAGNOSIS — M549 Dorsalgia, unspecified: Secondary | ICD-10-CM

## 2010-10-29 DIAGNOSIS — Z0000 Encounter for general adult medical examination without abnormal findings: Secondary | ICD-10-CM

## 2010-10-29 NOTE — Progress Notes (Signed)
  Subjective:    Patient ID: Christine Livingston, female    DOB: 12-31-1950, 60 y.o.   MRN: [id number]  HPI  cpx Home Bps are normal---120s/70s  Additional complaints: Past Medical History  Diagnosis Date  . Hypertension   . WPW (Wolff-Parkinson-White syndrome)    Past Surgical History  Procedure Date  . Laparoscopic drainage of ovarian cyst     reports that she quit smoking about 30 years ago. She does not have any smokeless tobacco history on file. She reports that she does not drink alcohol. Her drug history not on file. family history includes Breast cancer in her paternal aunt; Diabetes in her sister; Heart disease in her mother; Heart failure in her mother; and Hypertension in her father and sister. Allergies  Allergen Reactions  . Sulfamethoxazole W/Trimethoprim     REACTION: leg cramps   Review of Systems  patient denies chest pain, shortness of breath, orthopnea. Denies lower extremity edema, abdominal pain, change in appetite, change in bowel movements. Patient denies rashes, musculoskeletal complaints. No other specific complaints in a complete review of systems.      Objective:   Physical Exam  Well-developed well-nourished female in no acute distress. HEENT exam atraumatic, normocephalic, extraocular muscles are intact. Neck is supple. No jugular venous distention no thyromegaly. Chest clear to auscultation without increased work of breathing. Cardiac exam S1 and S2 are regular. Abdominal exam active bowel sounds, soft, nontender. Extremities no edema. Neurologic exam she is alert without any motor sensory deficits. Gait is normal.        Assessment & Plan:  Well visit Health maint UTD No intervention necessary---reminded to exercise regularly

## 2010-11-04 ENCOUNTER — Other Ambulatory Visit: Payer: Self-pay | Admitting: Internal Medicine

## 2010-11-04 DIAGNOSIS — M545 Low back pain: Secondary | ICD-10-CM

## 2010-11-12 ENCOUNTER — Ambulatory Visit: Payer: BC Managed Care – PPO | Attending: Internal Medicine

## 2010-11-12 DIAGNOSIS — M545 Low back pain, unspecified: Secondary | ICD-10-CM | POA: Insufficient documentation

## 2010-11-12 DIAGNOSIS — IMO0001 Reserved for inherently not codable concepts without codable children: Secondary | ICD-10-CM | POA: Insufficient documentation

## 2010-11-12 DIAGNOSIS — M2569 Stiffness of other specified joint, not elsewhere classified: Secondary | ICD-10-CM | POA: Insufficient documentation

## 2010-11-12 DIAGNOSIS — R5381 Other malaise: Secondary | ICD-10-CM | POA: Insufficient documentation

## 2010-11-13 ENCOUNTER — Ambulatory Visit: Payer: BC Managed Care – PPO | Admitting: Physical Therapy

## 2010-11-19 ENCOUNTER — Encounter: Payer: BC Managed Care – PPO | Admitting: Physical Therapy

## 2010-11-22 ENCOUNTER — Encounter: Payer: BC Managed Care – PPO | Admitting: Physical Therapy

## 2010-11-26 ENCOUNTER — Encounter: Payer: BC Managed Care – PPO | Admitting: Physical Therapy

## 2010-12-03 ENCOUNTER — Encounter: Payer: BC Managed Care – PPO | Admitting: Physical Therapy

## 2010-12-05 ENCOUNTER — Encounter: Payer: BC Managed Care – PPO | Admitting: Physical Therapy

## 2011-01-20 ENCOUNTER — Other Ambulatory Visit: Payer: Self-pay | Admitting: Internal Medicine

## 2011-01-21 ENCOUNTER — Other Ambulatory Visit: Payer: Self-pay | Admitting: *Deleted

## 2011-02-25 ENCOUNTER — Encounter: Payer: Self-pay | Admitting: Internal Medicine

## 2011-03-17 ENCOUNTER — Telehealth: Payer: Self-pay | Admitting: *Deleted

## 2011-03-17 NOTE — Telephone Encounter (Signed)
LINE BUSY X 2 

## 2011-03-24 NOTE — Telephone Encounter (Signed)
Patient will call back to schedule after she checks with her insurance company.

## 2011-08-05 LAB — HM PAP SMEAR

## 2011-11-04 ENCOUNTER — Telehealth: Payer: Self-pay | Admitting: Internal Medicine

## 2011-11-04 NOTE — Telephone Encounter (Signed)
Pt walked in about 1245 pm wanting to know some information about getting an injection. Please call her at home ASAP. She also has some questions about being around sick grandchildren that are on their way to the hospital now. [phone].

## 2011-11-04 NOTE — Telephone Encounter (Signed)
pts grandchild has whooping cough and she needs to help to take care of the other one down in [facility].  Pt was wanting to know if she needed the tdap.  Told pt she had tdap in 2010 and she should be ok

## 2011-11-12 ENCOUNTER — Other Ambulatory Visit (INDEPENDENT_AMBULATORY_CARE_PROVIDER_SITE_OTHER): Payer: BC Managed Care – PPO

## 2011-11-12 DIAGNOSIS — Z0000 Encounter for general adult medical examination without abnormal findings: Secondary | ICD-10-CM

## 2011-11-12 LAB — CBC WITH DIFFERENTIAL/PLATELET
Basophils Relative: 0.5 % (ref 0.0–3.0)
Eosinophils Relative: 1.4 % (ref 0.0–5.0)
HCT: 41.7 % (ref 36.0–46.0)
Hemoglobin: 14.1 g/dL (ref 12.0–15.0)
Lymphs Abs: 1.5 10*3/uL (ref 0.7–4.0)
MCV: 87.2 fl (ref 78.0–100.0)
Monocytes Absolute: 0.3 10*3/uL (ref 0.1–1.0)
Monocytes Relative: 6.6 % (ref 3.0–12.0)
Neutro Abs: 3.3 10*3/uL (ref 1.4–7.7)
RBC: 4.79 Mil/uL (ref 3.87–5.11)
WBC: 5.3 10*3/uL (ref 4.5–10.5)

## 2011-11-12 LAB — HEPATIC FUNCTION PANEL
ALT: 33 U/L (ref 0–35)
AST: 27 U/L (ref 0–37)
Albumin: 4.2 g/dL (ref 3.5–5.2)
Total Bilirubin: 0.6 mg/dL (ref 0.3–1.2)
Total Protein: 6.7 g/dL (ref 6.0–8.3)

## 2011-11-12 LAB — TSH: TSH: 1.07 u[IU]/mL (ref 0.35–5.50)

## 2011-11-12 LAB — POCT URINALYSIS DIPSTICK
Bilirubin, UA: NEGATIVE
Glucose, UA: NEGATIVE
Nitrite, UA: NEGATIVE
Spec Grav, UA: 1.01
Urobilinogen, UA: 0.2

## 2011-11-12 LAB — BASIC METABOLIC PANEL: GFR: 87.63 mL/min (ref >60.00)

## 2011-11-12 LAB — LDL CHOLESTEROL, DIRECT: Direct LDL: 156.5 mg/dL

## 2011-11-12 LAB — LIPID PANEL: VLDL: 17.2 mg/dL (ref 0.0–40.0)

## 2011-11-19 ENCOUNTER — Ambulatory Visit (INDEPENDENT_AMBULATORY_CARE_PROVIDER_SITE_OTHER): Payer: BC Managed Care – PPO | Admitting: Internal Medicine

## 2011-11-19 ENCOUNTER — Encounter: Payer: Self-pay | Admitting: Internal Medicine

## 2011-11-19 VITALS — BP 132/74 | HR 84 | Temp 98.6°F | Ht 68.0 in | Wt 164.0 lb

## 2011-11-19 DIAGNOSIS — Z0000 Encounter for general adult medical examination without abnormal findings: Secondary | ICD-10-CM

## 2011-11-19 DIAGNOSIS — Z2911 Encounter for prophylactic immunotherapy for respiratory syncytial virus (RSV): Secondary | ICD-10-CM

## 2011-11-19 MED ORDER — LISINOPRIL 5 MG PO TABS: 5.0000 mg | ORAL_TABLET | Freq: Every day | ORAL | Status: DC

## 2011-11-19 NOTE — Progress Notes (Signed)
Patient ID: Christine Livingston, female   DOB: 01/28/1951, 61 y.o.   MRN: [id number] cpx  Past Medical History  Diagnosis Date  . Hypertension   . WPW (Wolff-Parkinson-White syndrome)     History   Social History  . Marital Status: Married    Spouse Name: N/A    Number of Children: N/A  . Years of Education: N/A   Occupational History  . Not on file.   Social History Main Topics  . Smoking status: Former Smoker    Quit date: 10/28/1980  . Smokeless tobacco: Not on file  . Alcohol Use: No  . Drug Use: Not on file  . Sexually Active: Not on file   Other Topics Concern  . Not on file   Social History Narrative  . No narrative on file    Past Surgical History  Procedure Date  . Laparoscopic drainage of ovarian cyst     Family History  Problem Relation Age of Onset  . Heart disease Mother   . Heart failure Mother   . Hypertension Father   . Breast cancer Paternal Aunt   . Diabetes Sister   . Hypertension Sister     Allergies  Allergen Reactions  . Sulfamethoxazole W/Trimethoprim     REACTION: leg cramps    Current Outpatient Prescriptions on File Prior to Visit  Medication Sig Dispense Refill  . calcium-vitamin D (OSCAL WITH D) 500-200 MG-UNIT per tablet Take 1 tablet by mouth daily.        . fish oil-omega-3 fatty acids 1000 MG capsule Take 2 g by mouth daily.        . Flaxseed, Linseed, (FLAX SEED OIL) 1000 MG CAPS Take 1,000 mg by mouth daily.        [name] lisinopril (PRINIVIL,ZESTRIL) 5 MG tablet TAKE ONE TABLET BY MOUTH EVERY DAY  30 tablet  5  . Multiple Vitamin (MULTIVITAMIN) tablet Take 1 tablet by mouth daily.           patient denies chest pain, shortness of breath, orthopnea. Denies lower extremity edema, abdominal pain, change in appetite, change in bowel movements. Patient denies rashes, musculoskeletal complaints. No other specific complaints in a complete review of systems.   BP 132/74  Pulse 84  Temp(Src) 98.6 F (37 C) (Oral)  Ht 5\' 8"  (1.727 m)   Wt 164 lb (74.39 kg)  BMI 24.94 kg/m2  Well-developed well-nourished female in no acute distress. HEENT exam atraumatic, normocephalic, extraocular muscles are intact. Neck is supple. No jugular venous distention no thyromegaly. Chest clear to auscultation without increased work of breathing. Cardiac exam S1 and S2 are regular, 2/6 SEM (short). Abdominal exam active bowel sounds, soft, nontender. Extremities no edema. Neurologic exam she is alert without any motor sensory deficits. Gait is normal.   A/P--well visit, health maint UTD.

## 2012-01-22 ENCOUNTER — Encounter: Payer: Self-pay | Admitting: Gastroenterology

## 2012-03-04 ENCOUNTER — Encounter: Payer: Self-pay | Admitting: Internal Medicine

## 2012-03-11 ENCOUNTER — Encounter: Payer: Self-pay | Admitting: Internal Medicine

## 2012-03-22 LAB — HM COLONOSCOPY

## 2012-08-04 LAB — HM MAMMOGRAPHY: HM Mammogram: NORMAL

## 2012-10-05 ENCOUNTER — Encounter: Payer: Self-pay | Admitting: Internal Medicine

## 2013-01-18 ENCOUNTER — Other Ambulatory Visit (INDEPENDENT_AMBULATORY_CARE_PROVIDER_SITE_OTHER): Payer: BC Managed Care – PPO

## 2013-01-18 DIAGNOSIS — Z0000 Encounter for general adult medical examination without abnormal findings: Secondary | ICD-10-CM

## 2013-01-18 LAB — BASIC METABOLIC PANEL
Calcium: 9.4 mg/dL (ref 8.4–10.5)
Chloride: 103 mEq/L (ref 96–112)
Creatinine, Ser: 0.7 mg/dL (ref 0.4–1.2)
Sodium: 138 mEq/L (ref 135–145)

## 2013-01-18 LAB — CBC WITH DIFFERENTIAL/PLATELET
Basophils Relative: 1 % (ref 0.0–3.0)
Eosinophils Relative: 2.1 % (ref 0.0–5.0)
Lymphocytes Relative: 45 % (ref 12.0–46.0)
Neutrophils Relative %: 43.1 % (ref 43.0–77.0)
RBC: 4.91 Mil/uL (ref 3.87–5.11)
WBC: 4.1 10*3/uL — ABNORMAL LOW (ref 4.5–10.5)

## 2013-01-18 LAB — POCT URINALYSIS DIPSTICK
Ketones, UA: NEGATIVE
Protein, UA: NEGATIVE
Spec Grav, UA: 1.01
pH, UA: 7

## 2013-01-18 LAB — LIPID PANEL
Cholesterol: 234 mg/dL — ABNORMAL HIGH (ref 0–200)
Total CHOL/HDL Ratio: 4
VLDL: 15.8 mg/dL (ref 0.0–40.0)

## 2013-01-18 LAB — HEPATIC FUNCTION PANEL
ALT: 19 U/L (ref 0–35)
AST: 20 U/L (ref 0–37)
Alkaline Phosphatase: 60 U/L (ref 39–117)
Bilirubin, Direct: 0.1 mg/dL (ref 0.0–0.3)
Total Protein: 7.1 g/dL (ref 6.0–8.3)

## 2013-01-24 ENCOUNTER — Encounter: Payer: Self-pay | Admitting: Internal Medicine

## 2013-01-24 ENCOUNTER — Ambulatory Visit (INDEPENDENT_AMBULATORY_CARE_PROVIDER_SITE_OTHER): Payer: BC Managed Care – PPO | Admitting: Internal Medicine

## 2013-01-24 VITALS — BP 142/88 | HR 76 | Temp 98.4°F | Ht 67.5 in | Wt 154.0 lb

## 2013-01-24 DIAGNOSIS — Z0000 Encounter for general adult medical examination without abnormal findings: Secondary | ICD-10-CM

## 2013-01-24 MED ORDER — LISINOPRIL 5 MG PO TABS: 5.0000 mg | ORAL_TABLET | Freq: Every day | ORAL | Status: DC

## 2013-01-24 NOTE — Progress Notes (Signed)
Patient ID: Christine Livingston, female   DOB: 1951/03/07, 62 y.o.   MRN: [id number] cpx  Past Medical History  Diagnosis Date  . Hypertension   . WPW (Wolff-Parkinson-White syndrome)     History   Social History  . Marital Status: Married    Spouse Name: N/A    Number of Children: N/A  . Years of Education: N/A   Occupational History  . Not on file.   Social History Main Topics  . Smoking status: Former Smoker    Quit date: 10/28/1980  . Smokeless tobacco: Not on file  . Alcohol Use: No  . Drug Use: Not on file  . Sexually Active: Not on file   Other Topics Concern  . Not on file   Social History Narrative  . No narrative on file    Past Surgical History  Procedure Laterality Date  . Laparoscopic drainage of ovarian cyst      Family History  Problem Relation Age of Onset  . Heart disease Mother   . Heart failure Mother   . Hypertension Father   . Breast cancer Paternal Aunt   . Diabetes Sister   . Hypertension Sister     Allergies  Allergen Reactions  . Sulfamethoxazole W-Trimethoprim     REACTION: leg cramps    Current Outpatient Prescriptions on File Prior to Visit  Medication Sig Dispense Refill  . fish oil-omega-3 fatty acids 1000 MG capsule Take 1 g by mouth daily.       . Flaxseed, Linseed, (FLAX SEED OIL) 1000 MG CAPS Take 1,000 mg by mouth daily.        [name] lisinopril (PRINIVIL,ZESTRIL) 5 MG tablet Take 1 tablet (5 mg total) by mouth daily.  90 tablet  3  . Multiple Vitamin (MULTIVITAMIN) tablet Take 1 tablet by mouth daily.         No current facility-administered medications on file prior to visit.     patient denies chest pain, shortness of breath, orthopnea. Denies lower extremity edema, abdominal pain, change in appetite, change in bowel movements. Patient denies rashes, musculoskeletal complaints. No other specific complaints in a complete review of systems.   BP 142/88  Pulse 76  Temp(Src) 98.4 F (36.9 C) (Oral)  Ht 5' 7.5" (1.715 m)  Wt  154 lb (69.854 kg)  BMI 23.75 kg/m2   Well-developed well-nourished female in no acute distress. HEENT exam atraumatic, normocephalic, extraocular muscles are intact. Neck is supple. No jugular venous distention no thyromegaly. Chest clear to auscultation without increased work of breathing. Cardiac exam S1 and S2 are regular. Abdominal exam active bowel sounds, soft, nontender. Extremities no edema. Neurologic exam she is alert without any motor sensory deficits. Gait is normal.   Well visit- health maint UTD

## 2013-06-09 ENCOUNTER — Other Ambulatory Visit: Payer: Self-pay

## 2014-01-11 ENCOUNTER — Other Ambulatory Visit: Payer: Self-pay | Admitting: Internal Medicine

## 2014-01-17 ENCOUNTER — Other Ambulatory Visit (INDEPENDENT_AMBULATORY_CARE_PROVIDER_SITE_OTHER): Payer: BC Managed Care – PPO

## 2014-01-17 DIAGNOSIS — Z0000 Encounter for general adult medical examination without abnormal findings: Secondary | ICD-10-CM

## 2014-01-17 LAB — CBC WITH DIFFERENTIAL/PLATELET
Basophils Absolute: 0 10*3/uL (ref 0.0–0.1)
Basophils Relative: 0.8 % (ref 0.0–3.0)
EOS PCT: 2.4 % (ref 0.0–5.0)
Eosinophils Absolute: 0.1 10*3/uL (ref 0.0–0.7)
HEMATOCRIT: 42.1 % (ref 36.0–46.0)
HEMOGLOBIN: 13.9 g/dL (ref 12.0–15.0)
LYMPHS ABS: 1.8 10*3/uL (ref 0.7–4.0)
Lymphocytes Relative: 51.7 % — ABNORMAL HIGH (ref 12.0–46.0)
MCHC: 33 g/dL (ref 30.0–36.0)
MCV: 88.1 fl (ref 78.0–100.0)
MONO ABS: 0.3 10*3/uL (ref 0.1–1.0)
MONOS PCT: 8.5 % (ref 3.0–12.0)
NEUTROS ABS: 1.3 10*3/uL — AB (ref 1.4–7.7)
Neutrophils Relative %: 36.6 % — ABNORMAL LOW (ref 43.0–77.0)
Platelets: 200 10*3/uL (ref 150.0–400.0)
RBC: 4.78 Mil/uL (ref 3.87–5.11)
RDW: 13.2 % (ref 11.5–15.5)
WBC: 3.4 10*3/uL — AB (ref 4.0–10.5)

## 2014-01-17 LAB — LIPID PANEL
CHOL/HDL RATIO: 5
Cholesterol: 234 mg/dL — ABNORMAL HIGH (ref 0–200)
HDL: 51.7 mg/dL (ref >39.00)
LDL Cholesterol: 164 mg/dL — ABNORMAL HIGH (ref 0–99)
NONHDL: 182.3
Triglycerides: 92 mg/dL (ref 0.0–149.0)
VLDL: 18.4 mg/dL (ref 0.0–40.0)

## 2014-01-17 LAB — HEPATIC FUNCTION PANEL
ALBUMIN: 4.3 g/dL (ref 3.5–5.2)
ALT: 21 U/L (ref 0–35)
AST: 22 U/L (ref 0–37)
Alkaline Phosphatase: 67 U/L (ref 39–117)
Bilirubin, Direct: 0.1 mg/dL (ref 0.0–0.3)
TOTAL PROTEIN: 6.8 g/dL (ref 6.0–8.3)
Total Bilirubin: 0.6 mg/dL (ref 0.2–1.2)

## 2014-01-17 LAB — BASIC METABOLIC PANEL
BUN: 12 mg/dL (ref 6–23)
CHLORIDE: 104 meq/L (ref 96–112)
CO2: 28 mEq/L (ref 19–32)
Calcium: 9.3 mg/dL (ref 8.4–10.5)
Creatinine, Ser: 0.7 mg/dL (ref 0.4–1.2)
GFR: 85.63 mL/min (ref >60.00)
GLUCOSE: 83 mg/dL (ref 70–99)
POTASSIUM: 3.8 meq/L (ref 3.5–5.1)
SODIUM: 140 meq/L (ref 135–145)

## 2014-01-17 LAB — POCT URINALYSIS DIPSTICK
Bilirubin, UA: NEGATIVE
Glucose, UA: NEGATIVE
Ketones, UA: NEGATIVE
Leukocytes, UA: NEGATIVE
NITRITE UA: NEGATIVE
PH UA: 7.5
PROTEIN UA: NEGATIVE
RBC UA: NEGATIVE
Spec Grav, UA: 1.015
UROBILINOGEN UA: 0.2

## 2014-01-17 LAB — TSH: TSH: 1.45 u[IU]/mL (ref 0.35–4.50)

## 2014-01-18 ENCOUNTER — Ambulatory Visit: Payer: BC Managed Care – PPO | Admitting: Internal Medicine

## 2014-01-25 ENCOUNTER — Encounter: Payer: Self-pay | Admitting: Internal Medicine

## 2014-01-25 ENCOUNTER — Ambulatory Visit (INDEPENDENT_AMBULATORY_CARE_PROVIDER_SITE_OTHER): Payer: BC Managed Care – PPO | Admitting: Internal Medicine

## 2014-01-25 VITALS — BP 138/84 | HR 72 | Temp 98.9°F | Ht 67.5 in | Wt 158.0 lb

## 2014-01-25 DIAGNOSIS — I456 Pre-excitation syndrome: Secondary | ICD-10-CM

## 2014-01-25 DIAGNOSIS — Z0000 Encounter for general adult medical examination without abnormal findings: Secondary | ICD-10-CM

## 2014-01-25 NOTE — Assessment & Plan Note (Signed)
No sx's 

## 2014-01-25 NOTE — Progress Notes (Signed)
cpx Father now 63 years old  Past Medical History  Diagnosis Date  . Hypertension   . WPW (Wolff-Parkinson-White syndrome)     History   Social History  . Marital Status: Married    Spouse Name: N/A    Number of Children: N/A  . Years of Education: N/A   Occupational History  . Not on file.   Social History Main Topics  . Smoking status: Former Smoker    Quit date: 10/28/1980  . Smokeless tobacco: Not on file  . Alcohol Use: No  . Drug Use: Not on file  . Sexual Activity: Not on file   Other Topics Concern  . Not on file   Social History Narrative  . No narrative on file    Past Surgical History  Procedure Laterality Date  . Laparoscopic drainage of ovarian cyst      Family History  Problem Relation Age of Onset  . Heart disease Mother   . Heart failure Mother   . Hypertension Father   . Breast cancer Paternal Aunt   . Diabetes Sister   . Hypertension Sister     Allergies  Allergen Reactions  . Sulfamethoxazole-Trimethoprim     REACTION: leg cramps    Current Outpatient Prescriptions on File Prior to Visit  Medication Sig Dispense Refill  . cholecalciferol (VITAMIN D) 1000 UNITS tablet Take 1,000 Units by mouth daily.      . fish oil-omega-3 fatty acids 1000 MG capsule Take 1 g by mouth daily.       . Flaxseed, Linseed, (FLAX SEED OIL) 1000 MG CAPS Take 1,000 mg by mouth daily.        [name] lisinopril (PRINIVIL,ZESTRIL) 5 MG tablet TAKE ONE TABLET BY MOUTH ONCE DAILY  90 tablet  0  . Multiple Vitamin (MULTIVITAMIN) tablet Take 1 tablet by mouth daily.         No current facility-administered medications on file prior to visit.     patient denies chest pain, shortness of breath, orthopnea. Denies lower extremity edema, abdominal pain, change in appetite, change in bowel movements. Patient denies rashes, musculoskeletal complaints. No other specific complaints in a complete review of systems.   BP 152/88  Pulse 72  Temp(Src) 98.9 F (37.2 C) (Oral)   Ht 5' 7.5" (1.715 m)  Wt 158 lb (71.668 kg)  BMI 24.37 kg/m2  Well-developed well-nourished female in no acute distress. HEENT exam atraumatic, normocephalic, extraocular muscles are intact. Neck is supple. No jugular venous distention no thyromegaly. Chest clear to auscultation without increased work of breathing. Cardiac exam S1 and S2 are regular. Abdominal exam active bowel sounds, soft, nontender. Extremities no edema. Neurologic exam she is alert without any motor sensory deficits. Gait is normal.  Well Visit- health maint UTD Home bps- none

## 2014-01-25 NOTE — Progress Notes (Signed)
Pre visit review using our clinic review tool, if applicable. No additional management support is needed unless otherwise documented below in the visit note. 

## 2014-03-01 ENCOUNTER — Ambulatory Visit (INDEPENDENT_AMBULATORY_CARE_PROVIDER_SITE_OTHER): Payer: BC Managed Care – PPO | Admitting: Emergency Medicine

## 2014-03-01 ENCOUNTER — Telehealth: Payer: Self-pay | Admitting: Internal Medicine

## 2014-03-01 VITALS — BP 140/84 | HR 74 | Temp 97.6°F | Resp 20 | Ht 68.25 in | Wt 160.4 lb

## 2014-03-01 DIAGNOSIS — N3000 Acute cystitis without hematuria: Secondary | ICD-10-CM

## 2014-03-01 DIAGNOSIS — R350 Frequency of micturition: Secondary | ICD-10-CM

## 2014-03-01 DIAGNOSIS — IMO0001 Reserved for inherently not codable concepts without codable children: Secondary | ICD-10-CM

## 2014-03-01 LAB — POCT UA - MICROSCOPIC ONLY
CRYSTALS, UR, HPF, POC: NEGATIVE
Casts, Ur, LPF, POC: NEGATIVE
Mucus, UA: NEGATIVE
Yeast, UA: NEGATIVE

## 2014-03-01 LAB — POCT URINALYSIS DIPSTICK
Bilirubin, UA: NEGATIVE
GLUCOSE UA: NEGATIVE
Ketones, UA: NEGATIVE
NITRITE UA: NEGATIVE
Protein, UA: NEGATIVE
RBC UA: NEGATIVE
Spec Grav, UA: <=1.005
UROBILINOGEN UA: 0.2
pH, UA: 7

## 2014-03-01 MED ORDER — PHENAZOPYRIDINE HCL 200 MG PO TABS: 200.0000 mg | ORAL_TABLET | Freq: Three times a day (TID) | ORAL | Status: DC | PRN

## 2014-03-01 MED ORDER — CIPROFLOXACIN HCL 500 MG PO TABS: 500.0000 mg | ORAL_TABLET | Freq: Two times a day (BID) | ORAL | Status: DC

## 2014-03-01 NOTE — Progress Notes (Signed)
[facility] [address], [place] [address] [phone]  Date:  03/01/2014   Name:  Christine Livingston   DOB:  09-Sep-1950   MRN:  [id number]  PCP:  Chancy Hurter, MD    Chief Complaint: Back Pain   History of Present Illness:  Christine Livingston is a 63 y.o. very pleasant female patient who presents with the following:  No history of injury.  Has right CVA pain that is "jabbing".  Waxes and wanes.  Worse at night.  No fever or chills.  No blood in urine.  Has frequency and urgency but no dysuria.  No GYN symptoms.   No nausea or vomiting.  No stool change.  No improvement with over the counter medications or other home remedies. Denies other complaint or health concern today.   Patient Active Problem List   Diagnosis Date Noted  . WOLFF (WOLFE)-PARKINSON-WHITE (WPW) SYNDROME 02/03/2007  . HYPERTENSION 02/01/2007    Past Medical History  Diagnosis Date  . Hypertension   . WPW (Wolff-Parkinson-White syndrome)     Past Surgical History  Procedure Laterality Date  . Laparoscopic drainage of ovarian cyst      History  Substance Use Topics  . Smoking status: Former Smoker    Quit date: 10/28/1980  . Smokeless tobacco: Not on file  . Alcohol Use: No    Family History  Problem Relation Age of Onset  . Heart disease Mother   . Heart failure Mother   . Hypertension Father   . Breast cancer Paternal Aunt   . Diabetes Sister   . Hypertension Sister     Allergies  Allergen Reactions  . Sulfamethoxazole-Trimethoprim     REACTION: leg cramps    Medication list has been reviewed and updated.  Current Outpatient Prescriptions on File Prior to Visit  Medication Sig Dispense Refill  . cholecalciferol (VITAMIN D) 1000 UNITS tablet Take 1,000 Units by mouth daily.      [name] lisinopril (PRINIVIL,ZESTRIL) 5 MG tablet TAKE ONE TABLET BY MOUTH ONCE DAILY  90 tablet  0  . Multiple Vitamin (MULTIVITAMIN) tablet Take 1 tablet by mouth daily.         No current  facility-administered medications on file prior to visit.    Review of Systems:  As per HPI, otherwise negative.    Physical Examination: Filed Vitals:   03/01/14 1815  BP: 140/84  Pulse: 74  Temp: 97.6 F (36.4 C)  Resp: 20   Filed Vitals:   03/01/14 1815  Height: 5' 8.25" (1.734 m)  Weight: 160 lb 6.4 oz (72.757 kg)   Body mass index is 24.2 kg/(m^2). Ideal Body Weight: Weight in (lb) to have BMI = 25: 165.3  GEN: WDWN, NAD, Non-toxic, A & O x 3 HEENT: Atraumatic, Normocephalic. Neck supple. No masses, No LAD. Ears and Nose: No external deformity. CV: RRR, No M/G/R. No JVD. No thrill. No extra heart sounds. PULM: CTA B, no wheezes, crackles, rhonchi. No retractions. No resp. distress. No accessory muscle use. ABD: S, NT, ND, +BS. No rebound. No HSM. EXTR: No c/c/e NEURO Normal gait.  PSYCH: Normally interactive. Conversant. Not depressed or anxious appearing.  Calm demeanor.     Assessment and Plan: Cystitis Septra  Signed,  Ellison Carwin, MD   Results for orders placed in visit on 03/01/14  POCT URINALYSIS DIPSTICK      Result Value Ref Range   Color, UA light yellow     Clarity, UA clear  Glucose, UA neg     Bilirubin, UA neg     Ketones, UA neg     Spec Grav, UA <=1.005     Blood, UA neg     pH, UA 7.0     Protein, UA neg     Urobilinogen, UA 0.2     Nitrite, UA neg     Leukocytes, UA Trace    POCT UA - MICROSCOPIC ONLY      Result Value Ref Range   WBC, Ur, HPF, POC 1-3     RBC, urine, microscopic 0-1     Bacteria, U Microscopic trace     Mucus, UA neg     Epithelial cells, urine per micros 1-2     Crystals, Ur, HPF, POC neg     Casts, Ur, LPF, POC neg     Yeast, UA neg

## 2014-03-01 NOTE — Telephone Encounter (Signed)
Called and spoke with pt and advised appt could be made on tomorrow with PA.   Pt states that she has not had sleep in the last 2 nights and needs to be seen today.  Advised that pt be seen at [facility] or [facility].  Pt verbalized understanding.

## 2014-03-01 NOTE — Patient Instructions (Signed)

## 2014-03-01 NOTE — Telephone Encounter (Signed)
Patient Information:  Caller Name: Corisa  Phone: [phone]  Patient: Christine Livingston, Christine Livingston  Gender: Female  DOB: 09/16/1950  Age: 63 Years  PCP: Phoebe Sharps (Adults only, leaving end of July 2015)  Office Follow Up:  Does the office need to follow up with this patient?: Yes  Instructions For The Office: Please call. Pt lives 10 minutes from the office.  RN Note:  Pt reports an intermittent dull ache to a stabbing pain in her R flank. At present she ranks the pain a 2 on a 1-10 at present. a 6-7 when the pain hits. Voiding clear urine.  Symptoms  Reason For Call & Symptoms: Mid R back pain  Reviewed Health History In EMR: Yes  Reviewed Medications In EMR: Yes  Reviewed Allergies In EMR: Yes  Reviewed Surgeries / Procedures: Yes  Date of Onset of Symptoms: 02/22/2014  Treatments Tried: Advil 400mg   Treatments Tried Worked: No  Guideline(s) Used:  Flank Pain  Disposition Per Guideline:   See Today in Office  Reason For Disposition Reached:   Moderate pain (e.g., interferes with normal activities or awakens from sleep)  Advice Given: Increase fluid intake, Advil 400mg  q 6 hours.   Patient Will Follow Care Advice:  YES

## 2014-03-12 ENCOUNTER — Ambulatory Visit (INDEPENDENT_AMBULATORY_CARE_PROVIDER_SITE_OTHER): Payer: BC Managed Care – PPO | Admitting: Emergency Medicine

## 2014-03-12 VITALS — BP 122/78 | HR 85 | Temp 98.0°F | Resp 18 | Ht 69.0 in | Wt 161.0 lb

## 2014-03-12 DIAGNOSIS — R6884 Jaw pain: Secondary | ICD-10-CM

## 2014-03-12 DIAGNOSIS — M546 Pain in thoracic spine: Secondary | ICD-10-CM

## 2014-03-12 DIAGNOSIS — K1120 Sialoadenitis, unspecified: Secondary | ICD-10-CM

## 2014-03-12 LAB — POCT URINALYSIS DIPSTICK
BILIRUBIN UA: NEGATIVE
Glucose, UA: NEGATIVE
KETONES UA: NEGATIVE
Leukocytes, UA: NEGATIVE
Nitrite, UA: NEGATIVE
PH UA: 6.5
Protein, UA: NEGATIVE
SPEC GRAV UA: 1.01
Urobilinogen, UA: 0.2

## 2014-03-12 LAB — POCT CBC
Granulocyte percent: 65.7 %G (ref 37–80)
HEMATOCRIT: 48.8 % — AB (ref 37.7–47.9)
HEMOGLOBIN: 15.2 g/dL (ref 12.2–16.2)
LYMPH, POC: 2 (ref 0.6–3.4)
MCH: 27.8 pg (ref 27–31.2)
MCHC: 31.2 g/dL — AB (ref 31.8–35.4)
MCV: 88.9 fL (ref 80–97)
MID (cbc): 0.5 (ref 0–0.9)
MPV: 8.1 fL (ref 0–99.8)
POC Granulocyte: 4.9 (ref 2–6.9)
POC LYMPH PERCENT: 27.4 %L (ref 10–50)
POC MID %: 6.9 % (ref 0–12)
Platelet Count, POC: 211 10*3/uL (ref 142–424)
RBC: 5.49 M/uL — AB (ref 4.04–5.48)
RDW, POC: 13.1 %
WBC: 7.4 10*3/uL (ref 4.6–10.2)

## 2014-03-12 LAB — POCT UA - MICROSCOPIC ONLY
Casts, Ur, LPF, POC: NEGATIVE
Crystals, Ur, HPF, POC: NEGATIVE
Mucus, UA: NEGATIVE
Yeast, UA: NEGATIVE

## 2014-03-12 LAB — COMPLETE METABOLIC PANEL WITH GFR
ALT: 30 U/L (ref 0–35)
AST: 27 U/L (ref 0–37)
Albumin: 4.7 g/dL (ref 3.5–5.2)
Alkaline Phosphatase: 82 U/L (ref 39–117)
BILIRUBIN TOTAL: 1.1 mg/dL (ref 0.2–1.2)
BUN: 12 mg/dL (ref 6–23)
CO2: 28 meq/L (ref 19–32)
CREATININE: 0.72 mg/dL (ref 0.50–1.10)
Calcium: 9.6 mg/dL (ref 8.4–10.5)
Chloride: 100 mEq/L (ref 96–112)
GLUCOSE: 102 mg/dL — AB (ref 70–99)
Potassium: 3.7 mEq/L (ref 3.5–5.3)
Sodium: 138 mEq/L (ref 135–145)
Total Protein: 7.1 g/dL (ref 6.0–8.3)

## 2014-03-12 LAB — URIC ACID: URIC ACID, SERUM: 3.9 mg/dL (ref 2.4–7.0)

## 2014-03-12 LAB — POCT SEDIMENTATION RATE: POCT SED RATE: 10 mm/h (ref 0–22)

## 2014-03-12 MED ORDER — AMOXICILLIN-POT CLAVULANATE 875-125 MG PO TABS: 1.0000 | ORAL_TABLET | Freq: Two times a day (BID) | ORAL | Status: DC

## 2014-03-12 NOTE — Progress Notes (Signed)
Subjective:    Patient ID: Christine Livingston, female    DOB: Mar 04, 1951, 63 y.o.   MRN: [id number]  HPI  63 year old caucasian female presents to clinic with swollen right sided jaw/cheek area- since Friday morning - 2 days ago.   Pt complains of severe back pain.  Pt denies any further joint pain.  Pt states it is painful to chew and has not been hungry.     Review of Systems     Objective:   Physical Exam Right sided jaw swelling Swelling and pus discharge from right parotid gland area. The neck is otherwise supple the chest is clear heart regular rate no murmurs abdomen soft nontender neurologically the patient is alert and cooperative.[name] Results for orders placed in visit on 03/12/14  POCT CBC      Result Value Ref Range   WBC 7.4  4.6 - 10.2 K/uL   Lymph, poc 2.0  0.6 - 3.4   POC LYMPH PERCENT 27.4  10 - 50 %L   MID (cbc) 0.5  0 - 0.9   POC MID % 6.9  0 - 12 %M   POC Granulocyte 4.9  2 - 6.9   Granulocyte percent 65.7  37 - 80 %G   RBC 5.49 (*) 4.04 - 5.48 M/uL   Hemoglobin 15.2  12.2 - 16.2 g/dL   HCT, POC 48.8 (*) 37.7 - 47.9 %   MCV 88.9  80 - 97 fL   MCH, POC 27.8  27 - 31.2 pg   MCHC 31.2 (*) 31.8 - 35.4 g/dL   RDW, POC 13.1     Platelet Count, POC 211  142 - 424 K/uL   MPV 8.1  0 - 99.8 fL  POCT UA - MICROSCOPIC ONLY      Result Value Ref Range   WBC, Ur, HPF, POC 0-1     RBC, urine, microscopic 2-3     Bacteria, U Microscopic trace     Mucus, UA neg     Epithelial cells, urine per micros 0-1     Crystals, Ur, HPF, POC neg     Casts, Ur, LPF, POC neg     Yeast, UA neg    POCT URINALYSIS DIPSTICK      Result Value Ref Range   Color, UA yellow     Clarity, UA clear     Glucose, UA neg     Bilirubin, UA neg     Ketones, UA neg     Spec Grav, UA 1.010     Blood, UA trace     pH, UA 6.5     Protein, UA neg     Urobilinogen, UA 0.2     Nitrite, UA neg     Leukocytes, UA Negative     EKG shows a normal sinus rhythm. This tracing did not show signs of  WPW Meds ordered this encounter  Medications  . amoxicillin-clavulanate (AUGMENTIN) 875-125 MG per tablet    Sig: Take 1 tablet by mouth 2 (two) times daily.    Dispense:  20 tablet    Refill:  0      Assessment & Plan:  Patient presents just not feeling well over the past 10 days. She has had back pains treated for urinary tract infection and has just not felt well. She went to the dentist because of some jaw pain but x-rays at that time were normal. She has an obvious right suparative parotiditis. We'll treat with antibiotics and make  ENT referral.

## 2014-03-12 NOTE — Patient Instructions (Signed)
Salivary Gland Infection A salivary gland infection can be caused by a virus, bacteria from the mouth, or a stone. Mumps and other viruses may settle in one or more of the saliva glands. This will result in swelling, pain, and difficulty eating. Bacteria may cause a more severe infection in a salivary gland. A salivary stone blocking the flow of saliva can make this worse. These infections may be related to other medical problems. Some of these are dehydration, recent surgery, poor nutrition, and some medications. TREATMENT  Treatment of a salivary gland infection depends on the cause. Mumps and other virus infections do not require antibiotics. If bacteria cause the infection, then antibiotics are needed to get rid of the infection. If there is a salivary stone blocking the duct, minor surgery to remove the stone may be needed.  HOME CARE INSTRUCTIONS   Get plenty of rest, increase your fluids, and use warm compresses on the swollen area for 15 to 20 minutes 4 times per day or as often as feels good to you.  Suck on hard candy or chew sugarless gum to promote saliva production.  Only take over-the-counter or prescription medicines for pain, discomfort, or fever as directed by your caregiver. SEEK IMMEDIATE MEDICAL CARE IF:   You have increased swelling or pain or pain not relieved with medications.  You develop chills or a fever.  Any of your problems are getting worse rather than better. Document Released: 08/28/2004 Document Revised: 10/13/2011 Document Reviewed: 07/21/2005 [facility] Patient Information 2015 [place], [place]. This information is not intended to replace advice given to you by your health care provider. Make sure you discuss any questions you have with your health care provider.

## 2014-03-13 ENCOUNTER — Encounter: Payer: Self-pay | Admitting: Internal Medicine

## 2014-03-13 LAB — URINE CULTURE
COLONY COUNT: NO GROWTH
Organism ID, Bacteria: NO GROWTH

## 2014-03-14 LAB — WOUND CULTURE

## 2014-03-15 ENCOUNTER — Encounter: Payer: Self-pay | Admitting: Family Medicine

## 2014-03-22 ENCOUNTER — Telehealth: Payer: Self-pay | Admitting: Internal Medicine

## 2014-03-22 NOTE — Telephone Encounter (Signed)
Pt would like to know if dr. Raliegh Ip would accept her as a transfer pt from [facility].

## 2014-03-23 NOTE — Telephone Encounter (Signed)
lft msg for pt to call and schedule

## 2014-03-23 NOTE — Telephone Encounter (Signed)
ok 

## 2014-03-31 ENCOUNTER — Encounter: Payer: Self-pay | Admitting: Internal Medicine

## 2014-04-05 ENCOUNTER — Encounter: Payer: Self-pay | Admitting: Internal Medicine

## 2014-04-05 ENCOUNTER — Ambulatory Visit (INDEPENDENT_AMBULATORY_CARE_PROVIDER_SITE_OTHER): Payer: BC Managed Care – PPO | Admitting: Internal Medicine

## 2014-04-05 VITALS — BP 110/76 | HR 87 | Temp 98.2°F | Resp 18 | Ht 69.0 in | Wt 155.0 lb

## 2014-04-05 DIAGNOSIS — I10 Essential (primary) hypertension: Secondary | ICD-10-CM

## 2014-04-05 DIAGNOSIS — R197 Diarrhea, unspecified: Secondary | ICD-10-CM

## 2014-04-05 MED ORDER — METRONIDAZOLE 500 MG PO TABS: 500.0000 mg | ORAL_TABLET | Freq: Three times a day (TID) | ORAL | Status: DC

## 2014-04-05 NOTE — Progress Notes (Signed)
Subjective:    Patient ID: Christine Livingston, female    DOB: 08-06-1950, 63 y.o.   MRN: [id number]  HPI 63 year old patient who has treated her recently for parotiditis.  She was initially treated with amoxicillin and then referred to ENT where she completed 10 days of clindamycin.  5 days ago.  She had the onset of diarrhea.  No fever, but last time she felt she may have had some diaphoresis.  No bowel pain vomiting.  She has been on a clear liquid diet.  Past Medical History  Diagnosis Date  . Hypertension   . WPW (Wolff-Parkinson-White syndrome)     History   Social History  . Marital Status: Married    Spouse Name: N/A    Number of Children: N/A  . Years of Education: N/A   Occupational History  . Not on file.   Social History Main Topics  . Smoking status: Former Smoker    Quit date: 10/28/1980  . Smokeless tobacco: Not on file  . Alcohol Use: No  . Drug Use: Not on file  . Sexual Activity: Not on file   Other Topics Concern  . Not on file   Social History Narrative  . No narrative on file    Past Surgical History  Procedure Laterality Date  . Laparoscopic drainage of ovarian cyst      Family History  Problem Relation Age of Onset  . Heart disease Mother   . Heart failure Mother   . Hypertension Father   . Breast cancer Paternal Aunt   . Diabetes Sister   . Hypertension Sister     Allergies  Allergen Reactions  . Sulfamethoxazole-Trimethoprim     REACTION: leg cramps    Current Outpatient Prescriptions on File Prior to Visit  Medication Sig Dispense Refill  . cholecalciferol (VITAMIN D) 1000 UNITS tablet Take 1,000 Units by mouth daily.      [name] lisinopril (PRINIVIL,ZESTRIL) 5 MG tablet TAKE ONE TABLET BY MOUTH ONCE DAILY  90 tablet  0  . Multiple Vitamin (MULTIVITAMIN) tablet Take 1 tablet by mouth daily.         No current facility-administered medications on file prior to visit.    BP 110/76  Pulse 87  Temp(Src) 98.2 F (36.8 C) (Oral)  Resp  18  Ht 5\' 9"  (1.753 m)  Wt 155 lb (70.308 kg)  BMI 22.88 kg/m2  SpO2 98%      Review of Systems  Constitutional: Positive for fatigue.  HENT: Negative for congestion, dental problem, hearing loss, rhinorrhea, sinus pressure, sore throat and tinnitus.   Eyes: Negative for pain, discharge and visual disturbance.  Respiratory: Negative for cough and shortness of breath.   Cardiovascular: Negative for chest pain, palpitations and leg swelling.  Gastrointestinal: Positive for diarrhea. Negative for nausea, vomiting, abdominal pain, constipation, blood in stool and abdominal distention.  Genitourinary: Negative for dysuria, urgency, frequency, hematuria, flank pain, vaginal bleeding, vaginal discharge, difficulty urinating, vaginal pain and pelvic pain.  Musculoskeletal: Negative for arthralgias, gait problem and joint swelling.  Skin: Negative for rash.  Neurological: Negative for dizziness, syncope, speech difficulty, weakness, numbness and headaches.  Hematological: Negative for adenopathy.  Psychiatric/Behavioral: Negative for behavioral problems, dysphoric mood and agitation. The patient is not nervous/anxious.        Objective:   Physical Exam  Constitutional: She is oriented to person, place, and time. She appears well-developed and well-nourished.  HENT:  Head: Normocephalic.  Right Ear: External ear normal.  Left Ear:  External ear normal.  Mouth/Throat: Oropharynx is clear and moist.  Eyes: Conjunctivae and EOM are normal. Pupils are equal, round, and reactive to light.  Neck: Normal range of motion. Neck supple. No thyromegaly present.  Cardiovascular: Normal rate, regular rhythm, normal heart sounds and intact distal pulses.   Pulmonary/Chest: Effort normal and breath sounds normal.  Abdominal: Soft. Bowel sounds are normal. She exhibits no distension and no mass. There is no tenderness. There is no rebound and no guarding.  Musculoskeletal: Normal range of motion.    Lymphadenopathy:    She has no cervical adenopathy.  Neurological: She is alert and oriented to person, place, and time.  Skin: Skin is warm and dry. No rash noted.  Psychiatric: She has a normal mood and affect. Her behavior is normal.          Assessment & Plan:   Antibiotic associated diarrhea.  Rule out C. difficile.  Colitis.  We'll check a stool sample for C. difficile.  Toxin.  Empirically place on metronidazole until stool sample results known

## 2014-04-05 NOTE — Patient Instructions (Addendum)
Call or return to clinic prn if these symptoms worsen or fail to improve as anticipated.  Take your antibiotic as prescribed until ALL of it is gone, but stop if you develop a rash, swelling, or any side effects of the medication.  Contact our office as soon as possible if  there are side effects of the medication.Diarrhea Diarrhea is frequent loose and watery bowel movements. It can cause you to feel weak and dehydrated. Dehydration can cause you to become tired and thirsty, have a dry mouth, and have decreased urination that often is dark yellow. Diarrhea is a sign of another problem, most often an infection that will not last long. In most cases, diarrhea typically lasts 2-3 days. However, it can last longer if it is a sign of something more serious. It is important to treat your diarrhea as directed by your caregiver to lessen or prevent future episodes of diarrhea. CAUSES  Some common causes include:  Gastrointestinal infections caused by viruses, bacteria, or parasites.  Food poisoning or food allergies.  Certain medicines, such as antibiotics, chemotherapy, and laxatives.  Artificial sweeteners and fructose.  Digestive disorders. HOME CARE INSTRUCTIONS  Ensure adequate fluid intake (hydration): Have 1 cup (8 oz) of fluid for each diarrhea episode. Avoid fluids that contain simple sugars or sports drinks, fruit juices, whole milk products, and sodas. Your urine should be clear or pale yellow if you are drinking enough fluids. Hydrate with an oral rehydration solution that you can purchase at pharmacies, retail stores, and online. You can prepare an oral rehydration solution at home by mixing the following ingredients together:   - tsp table salt.   tsp baking soda.   tsp salt substitute containing potassium chloride.  1  tablespoons sugar.  1 L (34 oz) of water.  Certain foods and beverages may increase the speed at which food moves through the gastrointestinal (GI) tract. These  foods and beverages should be avoided and include:  Caffeinated and alcoholic beverages.  High-fiber foods, such as raw fruits and vegetables, nuts, seeds, and whole grain breads and cereals.  Foods and beverages sweetened with sugar alcohols, such as xylitol, sorbitol, and mannitol.  Some foods may be well tolerated and may help thicken stool including:  Starchy foods, such as rice, toast, pasta, low-sugar cereal, oatmeal, grits, baked potatoes, crackers, and bagels.  Bananas.  Applesauce.  Add probiotic-rich foods to help increase healthy bacteria in the GI tract, such as yogurt and fermented milk products.  Wash your hands well after each diarrhea episode.  Only take over-the-counter or prescription medicines as directed by your caregiver.  Take a warm bath to relieve any burning or pain from frequent diarrhea episodes. SEEK IMMEDIATE MEDICAL CARE IF:   You are unable to keep fluids down.  You have persistent vomiting.  You have blood in your stool, or your stools are black and tarry.  You do not urinate in 6-8 hours, or there is only a small amount of very dark urine.  You have abdominal pain that increases or localizes.  You have weakness, dizziness, confusion, or light-headedness.  You have a severe headache.  Your diarrhea gets worse or does not get better.  You have a fever or persistent symptoms for more than 2-3 days.  You have a fever and your symptoms suddenly get worse. MAKE SURE YOU:   Understand these instructions.  Will watch your condition.  Will get help right away if you are not doing well or get worse. Document Released:  07/11/2002 Document Revised: 12/05/2013 Document Reviewed: 03/28/2012 [facility] Patient Information 2015 [place], [place]. This information is not intended to replace advice given to you by your health care provider. Make sure you discuss any questions you have with your health care provider.

## 2014-04-05 NOTE — Progress Notes (Signed)
Pre visit review using our clinic review tool, if applicable. No additional management support is needed unless otherwise documented below in the visit note. 

## 2014-04-07 ENCOUNTER — Telehealth: Payer: Self-pay | Admitting: *Deleted

## 2014-04-07 LAB — CLOSTRIDIUM DIFFICILE BY PCR: Toxigenic C. Difficile by PCR: DETECTED — CR

## 2014-04-07 NOTE — Telephone Encounter (Signed)
Spoke to Dr.K, is was already aware of crital lab result and pt was treated with antibiotics.

## 2014-04-07 NOTE — Telephone Encounter (Signed)
Received call from [facility] with [facility], critical lab result on pt. Positive for C-Diff. Dr. Raliegh Ip, please advise.

## 2014-04-12 ENCOUNTER — Other Ambulatory Visit: Payer: Self-pay | Admitting: Internal Medicine

## 2014-04-12 NOTE — Telephone Encounter (Signed)
Patient Information:  Caller Name: Kylin  Phone: [phone]  Patient: Christine Livingston, Christine Livingston  Gender: Female  DOB: [birth year]  Age: 63 Years  PCP: Phoebe Sharps (Adults only, leaving end of July 2015)  Office Follow Up:  Does the office need to follow up with this patient?: Yes  Instructions For The Office: Patient asking if she should continue the Metronidazole over the weekend so symptoms will  not come back.  Planning to go out of town on Friday 9/11. Please review and contact patient at  [phone] (may leave message if phone not answered).   Symptoms  Reason For Call & Symptoms: Seen in [facility] 7/29, given antibiotic for UTI.  Seen in [facility] again 8/9 and had another antibiotic for jaw infection.  Saw ENT  and this antibitoic was changed to another one.  Started diarrhea on 8/28 and noting info on antibiotic that side effect could be diarrhea.  Had abd pain occassionally.  Seen in office, started Metronidazole to treat the diarrhea that has gone from 2-3 runny stools per day to now one soft stool in the mornings and occassionally a soft stool at HS. Feeling juch better but finishes Metronidazole on Sat 9/12.  Will be at [facility] over weekend/leaves Friday 9/11 and is asking if she should continue the medication through the weekend so diarrhea will not come back.  Reviewed Health History In EMR: Yes  Reviewed Medications In EMR: Yes  Reviewed Allergies In EMR: Yes  Reviewed Surgeries / Procedures: Yes  Date of Onset of Symptoms: 03/31/2014  Treatments Tried: antibiotic  Treatments Tried Worked: Yes  Guideline(s) Used:  Diarrhea  Disposition Per Guideline:   Callback by PCP Today  Reason For Disposition Reached:   Recent antibiotic therapy (i.e., within last 2 months)  Advice Given:  Fluids:  Drink more fluids, at least 8-10 glasses (8 oz or 240 ml) daily.  Supplement this with saltine crackers or soups to make certain that you are getting sufficient fluid and salt to meet your body's  needs.  Avoid caffeinated beverages (Reason: caffeine is mildly dehydrating).  Call Back If:  You become worse.  Patient Will Follow Care Advice:  YES

## 2014-04-12 NOTE — Telephone Encounter (Signed)
Spoke to pt, told her Dr. Raliegh Ip said she does not need anymore antibiotics, finish prescription at present. Also told pt Rx sent to pharmacy. Pt verbalized understanding.

## 2014-04-12 NOTE — Telephone Encounter (Signed)
#  [phone]  [id number] 

## 2014-08-16 ENCOUNTER — Encounter: Payer: Self-pay | Admitting: Family Medicine

## 2014-08-16 ENCOUNTER — Ambulatory Visit (INDEPENDENT_AMBULATORY_CARE_PROVIDER_SITE_OTHER): Payer: BC Managed Care – PPO | Admitting: Family Medicine

## 2014-08-16 VITALS — BP 146/88 | HR 116 | Temp 101.4°F | Ht 69.0 in | Wt 162.0 lb

## 2014-08-16 DIAGNOSIS — J209 Acute bronchitis, unspecified: Secondary | ICD-10-CM

## 2014-08-16 MED ORDER — AZITHROMYCIN 250 MG PO TABS: ORAL_TABLET | ORAL | Status: DC

## 2014-08-16 MED ORDER — HYDROCODONE-HOMATROPINE 5-1.5 MG/5ML PO SYRP: 5.0000 mL | ORAL_SOLUTION | ORAL | Status: DC | PRN

## 2014-08-16 NOTE — Progress Notes (Signed)
   Subjective:    Patient ID: Christine Livingston, female    DOB: 11-Aug-1950, 64 y.o.   MRN: [id number]  HPI Here for 4 days of body aches, fever, stuffy head, and coughing up green sputum. Drinking fluids and taking Mucinex DM.    Review of Systems  Constitutional: Positive for fever.  HENT: Positive for congestion and postnasal drip.   Eyes: Negative.   Respiratory: Positive for cough and chest tightness.        Objective:   Physical Exam  Constitutional: She appears well-developed and well-nourished.  HENT:  Right Ear: External ear normal.  Left Ear: External ear normal.  Nose: Nose normal.  Mouth/Throat: Oropharynx is clear and moist.  Eyes: Conjunctivae are normal.  Pulmonary/Chest: No respiratory distress. She has no wheezes. She has no rales.  Scattered rhonchi   Lymphadenopathy:    She has no cervical adenopathy.          Assessment & Plan:  Recheck prn

## 2014-08-16 NOTE — Progress Notes (Signed)
Pre visit review using our clinic review tool, if applicable. No additional management support is needed unless otherwise documented below in the visit note. 

## 2014-08-23 ENCOUNTER — Encounter: Payer: Self-pay | Admitting: Family Medicine

## 2014-08-23 ENCOUNTER — Ambulatory Visit (INDEPENDENT_AMBULATORY_CARE_PROVIDER_SITE_OTHER)
Admission: RE | Admit: 2014-08-23 | Discharge: 2014-08-23 | Disposition: A | Discharge status: Home / Self Care | Payer: BC Managed Care – PPO | Source: Ambulatory Visit | Attending: Family Medicine | Admitting: Family Medicine

## 2014-08-23 ENCOUNTER — Ambulatory Visit (INDEPENDENT_AMBULATORY_CARE_PROVIDER_SITE_OTHER): Payer: BC Managed Care – PPO | Admitting: Family Medicine

## 2014-08-23 VITALS — BP 148/87 | HR 93 | Temp 99.9°F | Ht 69.0 in | Wt 160.0 lb

## 2014-08-23 DIAGNOSIS — J209 Acute bronchitis, unspecified: Secondary | ICD-10-CM

## 2014-08-23 MED ORDER — LEVOFLOXACIN 500 MG PO TABS: 500.0000 mg | ORAL_TABLET | Freq: Every day | ORAL | Status: AC

## 2014-08-23 NOTE — Progress Notes (Signed)
Pre visit review using our clinic review tool, if applicable. No additional management support is needed unless otherwise documented below in the visit note. 

## 2014-08-23 NOTE — Progress Notes (Signed)
   Subjective:    Patient ID: Christine Livingston, female    DOB: [birth year], 64 y.o.   MRN: [id number]  HPI Here for continued cough and fever. She was seen here one week ago and was given a Zpack. She seemed to get better for a few days but now she is coughing again and the fever is back.    Review of Systems  Constitutional: Positive for fever and fatigue.  HENT: Positive for congestion. Negative for postnasal drip and sinus pressure.   Eyes: Negative.   Respiratory: Positive for cough and chest tightness. Negative for shortness of breath and wheezing.   Cardiovascular: Negative.        Objective:   Physical Exam  Constitutional: She appears well-developed and well-nourished.  HENT:  Right Ear: External ear normal.  Left Ear: External ear normal.  Nose: Nose normal.  Mouth/Throat: Oropharynx is clear and moist.  Eyes: Conjunctivae are normal.  Pulmonary/Chest: Effort normal and breath sounds normal. No respiratory distress. She has no wheezes. She has no rales.  Lymphadenopathy:    She has no cervical adenopathy.          Assessment & Plan:  With the recurrence of fever and coughing I am concerned about the possibility of pneumonia. Treat with Levaquin and get a CXR today.

## 2014-09-25 LAB — HM MAMMOGRAPHY

## 2014-09-28 ENCOUNTER — Encounter: Payer: Self-pay | Admitting: Internal Medicine

## 2014-10-04 ENCOUNTER — Encounter: Payer: Self-pay | Admitting: Internal Medicine

## 2014-10-11 ENCOUNTER — Telehealth: Payer: Self-pay | Admitting: Internal Medicine

## 2014-10-11 ENCOUNTER — Other Ambulatory Visit: Payer: Self-pay | Admitting: Internal Medicine

## 2014-10-11 NOTE — Telephone Encounter (Signed)
Pt request refill lisinopril (PRINIVIL,ZESTRIL) 5 MG tablet  [facility]  Going out of town in the am and needs in the morning (thurs) if you can.

## 2014-10-12 MED ORDER — LISINOPRIL 5 MG PO TABS: 5.0000 mg | ORAL_TABLET | Freq: Every day | ORAL | Status: DC

## 2014-10-12 NOTE — Telephone Encounter (Signed)
Left message Rx was sent to pharmacy.

## 2014-10-13 ENCOUNTER — Encounter: Payer: Self-pay | Admitting: Internal Medicine

## 2014-10-19 ENCOUNTER — Encounter: Payer: Self-pay | Admitting: Internal Medicine

## 2015-01-23 ENCOUNTER — Other Ambulatory Visit: Payer: BC Managed Care – PPO

## 2015-01-24 ENCOUNTER — Other Ambulatory Visit (INDEPENDENT_AMBULATORY_CARE_PROVIDER_SITE_OTHER): Payer: BC Managed Care – PPO

## 2015-01-24 DIAGNOSIS — Z0000 Encounter for general adult medical examination without abnormal findings: Secondary | ICD-10-CM

## 2015-01-24 LAB — POCT URINALYSIS DIPSTICK
BILIRUBIN UA: NEGATIVE
GLUCOSE UA: NEGATIVE
KETONES UA: NEGATIVE
Leukocytes, UA: NEGATIVE
Nitrite, UA: NEGATIVE
PROTEIN UA: NEGATIVE
Spec Grav, UA: <=1.005
Urobilinogen, UA: 0.2
pH, UA: 6

## 2015-01-24 LAB — HEPATIC FUNCTION PANEL
ALK PHOS: 69 U/L (ref 39–117)
ALT: 20 U/L (ref 0–35)
AST: 21 U/L (ref 0–37)
Albumin: 4.2 g/dL (ref 3.5–5.2)
BILIRUBIN TOTAL: 0.6 mg/dL (ref 0.2–1.2)
Bilirubin, Direct: 0.1 mg/dL (ref 0.0–0.3)
Total Protein: 6.9 g/dL (ref 6.0–8.3)

## 2015-01-24 LAB — CBC WITH DIFFERENTIAL/PLATELET
BASOS ABS: 0 10*3/uL (ref 0.0–0.1)
Basophils Relative: 0.7 % (ref 0.0–3.0)
EOS PCT: 1.6 % (ref 0.0–5.0)
Eosinophils Absolute: 0.1 10*3/uL (ref 0.0–0.7)
HEMATOCRIT: 42.9 % (ref 36.0–46.0)
Hemoglobin: 14.4 g/dL (ref 12.0–15.0)
Lymphocytes Relative: 46.1 % — ABNORMAL HIGH (ref 12.0–46.0)
Lymphs Abs: 2 10*3/uL (ref 0.7–4.0)
MCHC: 33.6 g/dL (ref 30.0–36.0)
MCV: 87 fl (ref 78.0–100.0)
Monocytes Absolute: 0.3 10*3/uL (ref 0.1–1.0)
Monocytes Relative: 7.6 % (ref 3.0–12.0)
Neutro Abs: 1.9 10*3/uL (ref 1.4–7.7)
Neutrophils Relative %: 44 % (ref 43.0–77.0)
Platelets: 213 10*3/uL (ref 150.0–400.0)
RBC: 4.93 Mil/uL (ref 3.87–5.11)
RDW: 13 % (ref 11.5–15.5)
WBC: 4.3 10*3/uL (ref 4.0–10.5)

## 2015-01-24 LAB — LIPID PANEL
Cholesterol: 221 mg/dL — ABNORMAL HIGH (ref 0–200)
HDL: 50.3 mg/dL (ref >39.00)
LDL CALC: 149 mg/dL — AB (ref 0–99)
NONHDL: 170.7
Total CHOL/HDL Ratio: 4
Triglycerides: 108 mg/dL (ref 0.0–149.0)
VLDL: 21.6 mg/dL (ref 0.0–40.0)

## 2015-01-24 LAB — BASIC METABOLIC PANEL
BUN: 17 mg/dL (ref 6–23)
CO2: 28 meq/L (ref 19–32)
Calcium: 9.3 mg/dL (ref 8.4–10.5)
Chloride: 101 mEq/L (ref 96–112)
Creatinine, Ser: 0.75 mg/dL (ref 0.40–1.20)
GFR: 82.73 mL/min (ref >60.00)
Glucose, Bld: 90 mg/dL (ref 70–99)
POTASSIUM: 4.2 meq/L (ref 3.5–5.1)
SODIUM: 135 meq/L (ref 135–145)

## 2015-01-24 LAB — TSH: TSH: 1.51 u[IU]/mL (ref 0.35–4.50)

## 2015-01-29 LAB — HM MAMMOGRAPHY

## 2015-01-30 ENCOUNTER — Ambulatory Visit (INDEPENDENT_AMBULATORY_CARE_PROVIDER_SITE_OTHER): Payer: BC Managed Care – PPO | Admitting: Internal Medicine

## 2015-01-30 ENCOUNTER — Encounter: Payer: Self-pay | Admitting: Internal Medicine

## 2015-01-30 VITALS — BP 140/90 | HR 82 | Temp 98.0°F | Resp 20 | Ht 67.0 in | Wt 165.0 lb

## 2015-01-30 DIAGNOSIS — I456 Pre-excitation syndrome: Secondary | ICD-10-CM

## 2015-01-30 DIAGNOSIS — Z860100 Personal history of colon polyps, unspecified: Secondary | ICD-10-CM

## 2015-01-30 DIAGNOSIS — Z0000 Encounter for general adult medical examination without abnormal findings: Secondary | ICD-10-CM

## 2015-01-30 DIAGNOSIS — I10 Essential (primary) hypertension: Secondary | ICD-10-CM

## 2015-01-30 MED ORDER — LISINOPRIL 10 MG PO TABS: 10.0000 mg | ORAL_TABLET | Freq: Every day | ORAL | Status: DC

## 2015-01-30 NOTE — Patient Instructions (Signed)
Limit your sodium (Salt) intake    It is important that you exercise regularly, at least 20 minutes 3 to 4 times per week.  If you develop chest pain or shortness of breath seek  medical attention.  Please check your blood pressure on a regular basis.  If it is consistently greater than 150/90, please make an office appointment.  Health Maintenance Adopting a healthy lifestyle and getting preventive care can go a long way to promote health and wellness. Talk with your health care provider about what schedule of regular examinations is right for you. This is a good chance for you to check in with your provider about disease prevention and staying healthy. In between checkups, there are plenty of things you can do on your own. Experts have done a lot of research about which lifestyle changes and preventive measures are most likely to keep you healthy. Ask your health care provider for more information. WEIGHT AND DIET  Eat a healthy diet  Be sure to include plenty of vegetables, fruits, low-fat dairy products, and lean protein.  Do not eat a lot of foods high in solid fats, added sugars, or salt.  Get regular exercise. This is one of the most important things you can do for your health.  Most adults should exercise for at least 150 minutes each week. The exercise should increase your heart rate and make you sweat (moderate-intensity exercise).  Most adults should also do strengthening exercises at least twice a week. This is in addition to the moderate-intensity exercise.  Maintain a healthy weight  Body mass index (BMI) is a measurement that can be used to identify possible weight problems. It estimates body fat based on height and weight. Your health care provider can help determine your BMI and help you achieve or maintain a healthy weight.  For females 20 years of age and older:   A BMI below 18.5 is considered underweight.  A BMI of 18.5 to 24.9 is normal.  A BMI of 25 to 29.9 is  considered overweight.  A BMI of 30 and above is considered obese.  Watch levels of cholesterol and blood lipids  You should start having your blood tested for lipids and cholesterol at 64 years of age, then have this test every 5 years.  You may need to have your cholesterol levels checked more often if:  Your lipid or cholesterol levels are high.  You are older than 64 years of age.  You are at high risk for heart disease.  CANCER SCREENING   Lung Cancer  Lung cancer screening is recommended for adults 55-80 years old who are at high risk for lung cancer because of a history of smoking.  A yearly low-dose CT scan of the lungs is recommended for people who:  Currently smoke.  Have quit within the past 15 years.  Have at least a 30-pack-year history of smoking. A pack year is smoking an average of one pack of cigarettes a day for 1 year.  Yearly screening should continue until it has been 15 years since you quit.  Yearly screening should stop if you develop a health problem that would prevent you from having lung cancer treatment.  Breast Cancer  Practice breast self-awareness. This means understanding how your breasts normally appear and feel.  It also means doing regular breast self-exams. Let your health care provider know about any changes, no matter how small.  If you are in your 20s or 30s, you should have a   clinical breast exam (CBE) by a health care provider every 1-3 years as part of a regular health exam.  If you are 40 or older, have a CBE every year. Also consider having a breast X-ray (mammogram) every year.  If you have a family history of breast cancer, talk to your health care provider about genetic screening.  If you are at high risk for breast cancer, talk to your health care provider about having an MRI and a mammogram every year.  Breast cancer gene (BRCA) assessment is recommended for women who have family members with BRCA-related cancers.  BRCA-related cancers include:  Breast.  Ovarian.  Tubal.  Peritoneal cancers.  Results of the assessment will determine the need for genetic counseling and BRCA1 and BRCA2 testing. Cervical Cancer Routine pelvic examinations to screen for cervical cancer are no longer recommended for nonpregnant women who are considered low risk for cancer of the pelvic organs (ovaries, uterus, and vagina) and who do not have symptoms. A pelvic examination may be necessary if you have symptoms including those associated with pelvic infections. Ask your health care provider if a screening pelvic exam is right for you.   The Pap test is the screening test for cervical cancer for women who are considered at risk.  If you had a hysterectomy for a problem that was not cancer or a condition that could lead to cancer, then you no longer need Pap tests.  If you are older than 65 years, and you have had normal Pap tests for the past 10 years, you no longer need to have Pap tests.  If you have had past treatment for cervical cancer or a condition that could lead to cancer, you need Pap tests and screening for cancer for at least 20 years after your treatment.  If you no longer get a Pap test, assess your risk factors if they change (such as having a new sexual partner). This can affect whether you should start being screened again.  Some women have medical problems that increase their chance of getting cervical cancer. If this is the case for you, your health care provider may recommend more frequent screening and Pap tests.  The human papillomavirus (HPV) test is another test that may be used for cervical cancer screening. The HPV test looks for the virus that can cause cell changes in the cervix. The cells collected during the Pap test can be tested for HPV.  The HPV test can be used to screen women 30 years of age and older. Getting tested for HPV can extend the interval between normal Pap tests from three to  five years.  An HPV test also should be used to screen women of any age who have unclear Pap test results.  After 64 years of age, women should have HPV testing as often as Pap tests.  Colorectal Cancer  This type of cancer can be detected and often prevented.  Routine colorectal cancer screening usually begins at 64 years of age and continues through 64 years of age.  Your health care provider may recommend screening at an earlier age if you have risk factors for colon cancer.  Your health care provider may also recommend using home test kits to check for hidden blood in the stool.  A small camera at the end of a tube can be used to examine your colon directly (sigmoidoscopy or colonoscopy). This is done to check for the earliest forms of colorectal cancer.  Routine screening usually begins at   age 5.  Direct examination of the colon should be repeated every 5-10 years through 64 years of age. However, you may need to be screened more often if early forms of precancerous polyps or small growths are found. Skin Cancer  Check your skin from head to toe regularly.  Tell your health care provider about any new moles or changes in moles, especially if there is a change in a mole's shape or color.  Also tell your health care provider if you have a mole that is larger than the size of a pencil eraser.  Always use sunscreen. Apply sunscreen liberally and repeatedly throughout the day.  Protect yourself by wearing long sleeves, pants, a wide-brimmed hat, and sunglasses whenever you are outside. HEART DISEASE, DIABETES, AND HIGH BLOOD PRESSURE   Have your blood pressure checked at least every 1-2 years. High blood pressure causes heart disease and increases the risk of stroke.  If you are between 70 years and 61 years old, ask your health care provider if you should take aspirin to prevent strokes.  Have regular diabetes screenings. This involves taking a blood sample to check your  fasting blood sugar level.  If you are at a normal weight and have a low risk for diabetes, have this test once every three years after 64 years of age.  If you are overweight and have a high risk for diabetes, consider being tested at a younger age or more often. PREVENTING INFECTION  Hepatitis B  If you have a higher risk for hepatitis B, you should be screened for this virus. You are considered at high risk for hepatitis B if:  You were born in a country where hepatitis B is common. Ask your health care provider which countries are considered high risk.  Your parents were born in a high-risk country, and you have not been immunized against hepatitis B (hepatitis B vaccine).  You have HIV or AIDS.  You use needles to inject street drugs.  You live with someone who has hepatitis B.  You have had sex with someone who has hepatitis B.  You get hemodialysis treatment.  You take certain medicines for conditions, including cancer, organ transplantation, and autoimmune conditions. Hepatitis C  Blood testing is recommended for:  Everyone born from [birth year] through 1965.  Anyone with known risk factors for hepatitis C. Sexually transmitted infections (STIs)  You should be screened for sexually transmitted infections (STIs) including gonorrhea and chlamydia if:  You are sexually active and are younger than 64 years of age.  You are older than 64 years of age and your health care provider tells you that you are at risk for this type of infection.  Your sexual activity has changed since you were last screened and you are at an increased risk for chlamydia or gonorrhea. Ask your health care provider if you are at risk.  If you do not have HIV, but are at risk, it may be recommended that you take a prescription medicine daily to prevent HIV infection. This is called pre-exposure prophylaxis (PrEP). You are considered at risk if:  You are sexually active and do not regularly use condoms or  know the HIV status of your partner(s).  You take drugs by injection.  You are sexually active with a partner who has HIV. Talk with your health care provider about whether you are at high risk of being infected with HIV. If you choose to begin PrEP, you should first be tested for HIV. You  should then be tested every 3 months for as long as you are taking PrEP.  PREGNANCY   If you are premenopausal and you may become pregnant, ask your health care provider about preconception counseling.  If you may become pregnant, take 400 to 800 micrograms (mcg) of folic acid every day.  If you want to prevent pregnancy, talk to your health care provider about birth control (contraception). OSTEOPOROSIS AND MENOPAUSE   Osteoporosis is a disease in which the bones lose minerals and strength with aging. This can result in serious bone fractures. Your risk for osteoporosis can be identified using a bone density scan.  If you are 73 years of age or older, or if you are at risk for osteoporosis and fractures, ask your health care provider if you should be screened.  Ask your health care provider whether you should take a calcium or vitamin D supplement to lower your risk for osteoporosis.  Menopause may have certain physical symptoms and risks.  Hormone replacement therapy may reduce some of these symptoms and risks. Talk to your health care provider about whether hormone replacement therapy is right for you.  HOME CARE INSTRUCTIONS   Schedule regular health, dental, and eye exams.  Stay current with your immunizations.   Do not use any tobacco products including cigarettes, chewing tobacco, or electronic cigarettes.  If you are pregnant, do not drink alcohol.  If you are breastfeeding, limit how much and how often you drink alcohol.  Limit alcohol intake to no more than 1 drink per day for nonpregnant women. One drink equals 12 ounces of beer, 5 ounces of wine, or 1 ounces of hard liquor.  Do  not use street drugs.  Do not share needles.  Ask your health care provider for help if you need support or information about quitting drugs.  Tell your health care provider if you often feel depressed.  Tell your health care provider if you have ever been abused or do not feel safe at home. Document Released: 02/03/2011 Document Revised: 12/05/2013 Document Reviewed: 06/22/2013 [facility] Patient Information 2015 [place], [place]. This information is not intended to replace advice given to you by your health care provider. Make sure you discuss any questions you have with your health care provider.

## 2015-01-30 NOTE — Progress Notes (Signed)
Pre visit review using our clinic review tool, if applicable. No additional management support is needed unless otherwise documented below in the visit note. 

## 2015-01-30 NOTE — Progress Notes (Signed)
Subjective:    Patient ID: Christine Livingston, female    DOB: 10-04-1950, 64 y.o.   MRN: [id number]  HPI 64 year old patient who is seen today for a preventive health examination  Medical problems include essential hypertension, history of WPW.  She had colonoscopy in 2013, which revealed colonic polyps.  She is scheduled for follow-up sigmoidoscopy later this year Doing well without concerns or complaints  Past Medical History  Diagnosis Date  . Hypertension   . WPW (Wolff-Parkinson-White syndrome)     History   Social History  . Marital Status: Married    Spouse Name: N/A  . Number of Children: N/A  . Years of Education: N/A   Occupational History  . Not on file.   Social History Main Topics  . Smoking status: Former Smoker    Quit date: 10/28/1980  . Smokeless tobacco: Never Used  . Alcohol Use: 0.0 oz/week    0 Standard drinks or equivalent per week     Comment: occ  . Drug Use: Not on file  . Sexual Activity: Not on file   Other Topics Concern  . Not on file   Social History Narrative    Past Surgical History  Procedure Laterality Date  . Laparoscopic drainage of ovarian cyst      Family History  Problem Relation Age of Onset  . Heart disease Mother   . Heart failure Mother   . Hypertension Father   . Breast cancer Paternal Aunt   . Diabetes Sister   . Hypertension Sister     Allergies  Allergen Reactions  . Sulfamethoxazole-Trimethoprim     REACTION: leg cramps    Current Outpatient Prescriptions on File Prior to Visit  Medication Sig Dispense Refill  . cholecalciferol (VITAMIN D) 1000 UNITS tablet Take 1,000 Units by mouth daily.    [name] lisinopril (PRINIVIL,ZESTRIL) 5 MG tablet Take 1 tablet (5 mg total) by mouth daily. 90 tablet 1  . Multiple Vitamin (MULTIVITAMIN) tablet Take 1 tablet by mouth daily.       No current facility-administered medications on file prior to visit.    BP 140/90 mmHg  Pulse 82  Temp(Src) 98 F (36.7 C) (Oral)   Resp 20  Ht 5\' 7"  (1.702 m)  Wt 165 lb (74.844 kg)  BMI 25.84 kg/m2  SpO2 99%      Review of Systems  Constitutional: Negative for fever, appetite change, fatigue and unexpected weight change.  HENT: Negative for congestion, dental problem, ear pain, hearing loss, mouth sores, nosebleeds, sinus pressure, sore throat, tinnitus, trouble swallowing and voice change.   Eyes: Negative for photophobia, pain, redness and visual disturbance.  Respiratory: Negative for cough, chest tightness and shortness of breath.   Cardiovascular: Negative for chest pain, palpitations and leg swelling.  Gastrointestinal: Negative for nausea, vomiting, abdominal pain, diarrhea, constipation, blood in stool, abdominal distention and rectal pain.  Genitourinary: Negative for dysuria, urgency, frequency, hematuria, flank pain, vaginal bleeding, vaginal discharge, difficulty urinating, genital sores, vaginal pain, menstrual problem and pelvic pain.  Musculoskeletal: Negative for back pain, arthralgias and neck stiffness.  Skin: Negative for rash.  Neurological: Negative for dizziness, syncope, speech difficulty, weakness, light-headedness, numbness and headaches.  Hematological: Negative for adenopathy. Does not bruise/bleed easily.  Psychiatric/Behavioral: Negative for suicidal ideas, behavioral problems, self-injury, dysphoric mood and agitation. The patient is not nervous/anxious.        Objective:   Physical Exam  Constitutional: She is oriented to person, place, and time. She appears well-developed and  well-nourished.  Blood pressure 140/86  HENT:  Head: Normocephalic and atraumatic.  Right Ear: External ear normal.  Left Ear: External ear normal.  Mouth/Throat: Oropharynx is clear and moist.  Eyes: Conjunctivae and EOM are normal.  Neck: Normal range of motion. Neck supple. No JVD present. No thyromegaly present.  Cardiovascular: Normal rate, regular rhythm, normal heart sounds and intact distal  pulses.   No murmur heard. Pulmonary/Chest: Effort normal and breath sounds normal. She has no wheezes. She has no rales.  Abdominal: Soft. Bowel sounds are normal. She exhibits no distension and no mass. There is no tenderness. There is no rebound and no guarding.  Genitourinary: Vagina normal.  Musculoskeletal: Normal range of motion. She exhibits no edema or tenderness.  Neurological: She is alert and oriented to person, place, and time. She has normal reflexes. No cranial nerve deficit. She exhibits normal muscle tone. Coordination normal.  Skin: Skin is warm and dry. No rash noted.  Psychiatric: She has a normal mood and affect. Her behavior is normal.          Assessment & Plan:   Preventive health examination Hypertension.  Blood pressure borderline high today.  Home blood pressure monitoring.  Encouraged.  She will call if blood pressure readings are consistently greater than 140 over 90 History colonic polyps.  Follow-up sigmoidoscopy.  Encouraged  Patient has had a recent OB/GYN with Pap earlier this year Recheck one year

## 2015-02-01 ENCOUNTER — Encounter: Payer: Self-pay | Admitting: Internal Medicine

## 2015-02-12 ENCOUNTER — Encounter: Payer: Self-pay | Admitting: Internal Medicine

## 2015-03-07 ENCOUNTER — Telehealth: Payer: Self-pay | Admitting: Internal Medicine

## 2015-03-07 MED ORDER — LISINOPRIL 10 MG PO TABS: 10.0000 mg | ORAL_TABLET | Freq: Every day | ORAL | Status: DC

## 2015-03-07 NOTE — Telephone Encounter (Signed)
Pt needs refill on lisinopril 10 mg # 90 sent to [facility]  [address]

## 2015-03-07 NOTE — Telephone Encounter (Signed)
Spoke to pt, told her Rx was sent on 6/28 when she was in. Pt said pharmacy does not have it. Told pt okay will send again. Pt verbalized understanding. Rxsent.

## 2015-03-20 ENCOUNTER — Ambulatory Visit (INDEPENDENT_AMBULATORY_CARE_PROVIDER_SITE_OTHER): Payer: BC Managed Care – PPO | Admitting: Internal Medicine

## 2015-03-20 ENCOUNTER — Encounter: Payer: Self-pay | Admitting: Internal Medicine

## 2015-03-20 VITALS — BP 130/90 | HR 77 | Temp 98.6°F | Resp 20 | Ht 67.0 in | Wt 169.0 lb

## 2015-03-20 DIAGNOSIS — R350 Frequency of micturition: Secondary | ICD-10-CM

## 2015-03-20 DIAGNOSIS — I10 Essential (primary) hypertension: Secondary | ICD-10-CM | POA: Diagnosis not present

## 2015-03-20 LAB — POCT URINALYSIS DIPSTICK
Bilirubin, UA: NEGATIVE
Blood, UA: NEGATIVE
GLUCOSE UA: NEGATIVE
KETONES UA: NEGATIVE
Leukocytes, UA: NEGATIVE
Nitrite, UA: NEGATIVE
PROTEIN UA: NEGATIVE
SPEC GRAV UA: 1.01
Urobilinogen, UA: 0.2
pH, UA: 7

## 2015-03-20 NOTE — Patient Instructions (Signed)
Report any new or worsening symptoms  Limit your sodium (Salt) intake  Please check your blood pressure on a regular basis.  If it is consistently greater than 150/90, please make an office appointment.  Return in 6 months for follow-up

## 2015-03-20 NOTE — Progress Notes (Signed)
Pre visit review using our clinic review tool, if applicable. No additional management support is needed unless otherwise documented below in the visit note. 

## 2015-03-20 NOTE — Progress Notes (Signed)
Subjective:    Patient ID: Christine Livingston, female    DOB: [birth year], 64 y.o.   MRN: [id number]  HPI 64 year old patient who presents with a chief complaint of urinary frequency without dysuria.  She was a bit concerned about a bladder infection.  For the past 2 days she has had some fatigue and last night she felt feverish.  She has treated hypertension which has been stable.  No other concerns or complaints.  No excessive caffeine load.  Past Medical History  Diagnosis Date  . Hypertension   . WPW (Wolff-Parkinson-White syndrome)     Social History   Social History  . Marital Status: Married    Spouse Name: N/A  . Number of Children: N/A  . Years of Education: N/A   Occupational History  . Not on file.   Social History Main Topics  . Smoking status: Former Smoker    Quit date: 10/28/1980  . Smokeless tobacco: Never Used  . Alcohol Use: 0.0 oz/week    0 Standard drinks or equivalent per week     Comment: occ  . Drug Use: Not on file  . Sexual Activity: Not on file   Other Topics Concern  . Not on file   Social History Narrative    Past Surgical History  Procedure Laterality Date  . Laparoscopic drainage of ovarian cyst      Family History  Problem Relation Age of Onset  . Heart disease Mother   . Heart failure Mother   . Hypertension Father   . Breast cancer Paternal Aunt   . Diabetes Sister   . Hypertension Sister     Allergies  Allergen Reactions  . Sulfamethoxazole-Trimethoprim     REACTION: leg cramps    Current Outpatient Prescriptions on File Prior to Visit  Medication Sig Dispense Refill  . cholecalciferol (VITAMIN D) 1000 UNITS tablet Take 1,000 Units by mouth daily.    . Lactobacillus Rhamnosus, GG, (CULTURELLE) CAPS Take 1 capsule by mouth daily.    [name] lisinopril (PRINIVIL,ZESTRIL) 10 MG tablet Take 1 tablet (10 mg total) by mouth daily. 90 tablet 3  . Multiple Vitamin (MULTIVITAMIN) tablet Take 1 tablet by mouth daily.       No current  facility-administered medications on file prior to visit.    BP 130/90 mmHg  Pulse 77  Temp(Src) 98.6 F (37 C) (Oral)  Resp 20  Ht 5\' 7"  (1.702 m)  Wt 169 lb (76.658 kg)  BMI 26.46 kg/m2  SpO2 98%      Review of Systems  Constitutional: Negative.   HENT: Negative for congestion, dental problem, hearing loss, rhinorrhea, sinus pressure, sore throat and tinnitus.   Eyes: Negative for pain, discharge and visual disturbance.  Respiratory: Negative for cough and shortness of breath.   Cardiovascular: Negative for chest pain, palpitations and leg swelling.  Gastrointestinal: Negative for nausea, vomiting, abdominal pain, diarrhea, constipation, blood in stool and abdominal distention.  Endocrine: Positive for polyuria.  Genitourinary: Negative for dysuria, urgency, frequency, hematuria, flank pain, vaginal bleeding, vaginal discharge, difficulty urinating, vaginal pain and pelvic pain.  Musculoskeletal: Negative for joint swelling, arthralgias and gait problem.  Skin: Negative for rash.  Neurological: Negative for dizziness, syncope, speech difficulty, weakness, numbness and headaches.  Hematological: Negative for adenopathy.  Psychiatric/Behavioral: Negative for behavioral problems, dysphoric mood and agitation. The patient is not nervous/anxious.        Objective:   Physical Exam  Constitutional: She is oriented to person, place, and time. She appears  well-developed and well-nourished.  Blood pressure 122/78  HENT:  Head: Normocephalic.  Right Ear: External ear normal.  Left Ear: External ear normal.  Mouth/Throat: Oropharynx is clear and moist.  Eyes: Conjunctivae and EOM are normal. Pupils are equal, round, and reactive to light.  Neck: Normal range of motion. Neck supple. No thyromegaly present.  Cardiovascular: Normal rate, regular rhythm, normal heart sounds and intact distal pulses.   Pulmonary/Chest: Effort normal and breath sounds normal.  Abdominal: Soft. Bowel  sounds are normal. She exhibits no distension and no mass. There is no tenderness. There is no rebound and no guarding.  Musculoskeletal: Normal range of motion.  Lymphadenopathy:    She has no cervical adenopathy.  Neurological: She is alert and oriented to person, place, and time.  Skin: Skin is warm and dry. No rash noted.  Psychiatric: She has a normal mood and affect. Her behavior is normal.          Assessment & Plan:   Urinary frequency.  Urinalysis normal Fatigue.  Probable viral syndrome  Will treat symptomatically. Patient will report any new or worsening symptoms  Hypertension, well-controlled.  Repeat blood pressure 122/78

## 2015-03-30 LAB — HM COLONOSCOPY

## 2015-03-31 ENCOUNTER — Ambulatory Visit (INDEPENDENT_AMBULATORY_CARE_PROVIDER_SITE_OTHER): Payer: BC Managed Care – PPO | Admitting: Physician Assistant

## 2015-03-31 VITALS — BP 116/80 | HR 87 | Temp 98.4°F | Ht 67.25 in | Wt 165.0 lb

## 2015-03-31 DIAGNOSIS — R350 Frequency of micturition: Secondary | ICD-10-CM | POA: Diagnosis not present

## 2015-03-31 DIAGNOSIS — M545 Low back pain, unspecified: Secondary | ICD-10-CM

## 2015-03-31 LAB — POCT CBC
Granulocyte percent: 59.5 %G (ref 37–80)
HCT, POC: 46.5 % (ref 37.7–47.9)
Hemoglobin: 14.4 g/dL (ref 12.2–16.2)
Lymph, poc: 1.7 (ref 0.6–3.4)
MCH: 26.5 pg — AB (ref 27–31.2)
MCHC: 31.1 g/dL — AB (ref 31.8–35.4)
MCV: 85.1 fL (ref 80–97)
MID (cbc): 0.4 (ref 0–0.9)
MPV: 7.7 fL (ref 0–99.8)
PLATELET COUNT, POC: 233 10*3/uL (ref 142–424)
POC GRANULOCYTE: 3 (ref 2–6.9)
POC LYMPH PERCENT: 33.1 %L (ref 10–50)
POC MID %: 7.4 %M (ref 0–12)
RBC: 5.46 M/uL (ref 4.04–5.48)
RDW, POC: 13.1 %
WBC: 5 10*3/uL (ref 4.6–10.2)

## 2015-03-31 LAB — POCT URINALYSIS DIPSTICK
Bilirubin, UA: NEGATIVE
Glucose, UA: NEGATIVE
Ketones, UA: NEGATIVE
Leukocytes, UA: NEGATIVE
Nitrite, UA: NEGATIVE
PROTEIN UA: NEGATIVE
UROBILINOGEN UA: 0.2
pH, UA: 5.5

## 2015-03-31 LAB — POCT UA - MICROSCOPIC ONLY
CASTS, UR, LPF, POC: NEGATIVE
Crystals, Ur, HPF, POC: NEGATIVE
Mucus, UA: NEGATIVE
YEAST UA: NEGATIVE

## 2015-03-31 MED ORDER — CYCLOBENZAPRINE HCL 5 MG PO TABS: 5.0000 mg | ORAL_TABLET | Freq: Three times a day (TID) | ORAL | Status: DC | PRN

## 2015-03-31 MED ORDER — MELOXICAM 7.5 MG PO TABS: 7.5000 mg | ORAL_TABLET | Freq: Every day | ORAL | Status: DC

## 2015-03-31 NOTE — Progress Notes (Signed)
Subjective:    Patient ID: Christine Livingston, female    DOB: 30-Jan-1951, 64 y.o.   MRN: [id number]  Chief Complaint  Patient presents with  . Back Pain    right sided pulsating back pain x's 1 day   Medications, allergies, past medical history, surgical history, family history, social history and problem list reviewed and updated.  HPI  64 yof presents with right sided low back pain.   Sx started yest morning upon waking. Intermittent right sided low back pain, sharp, rated 6/10 when comes on. Resolves on own after 1 min. Episodes approx every hour. No aggravating factors. The day prior she had been doing bowel prep for colonoscopy she had late yest morning. Colonoscopy went well per pt.   No new activities to make her think she strained it. Denies dysuria. Maybe some mild increased freq. Denies hematuria, fevers, chills, abd pain.   Had normal ua 11 days ago for dysuria.   Review of Systems See HPI     Objective:   Physical Exam  Constitutional: She is oriented to person, place, and time. She appears well-developed and well-nourished.  Non-toxic appearance. She does not have a sickly appearance. She does not appear ill. No distress.  BP 116/80 mmHg  Pulse 87  Temp(Src) 98.4 F (36.9 C) (Oral)  Ht 5' 7.25" (1.708 m)  Wt 165 lb (74.844 kg)  BMI 25.66 kg/m2  SpO2 98%   Abdominal: Soft. Normal appearance and bowel sounds are normal. There is no tenderness. There is no CVA tenderness.  Musculoskeletal:       Lumbar back: She exhibits tenderness. She exhibits no bony tenderness.       Back:  Mild right low back ttp.   Neurological: She is alert and oriented to person, place, and time.  Negative SLR bilaterally.    Results for orders placed or performed in visit on 03/31/15  POCT CBC  Result Value Ref Range   WBC 5.0 4.6 - 10.2 K/uL   Lymph, poc 1.7 0.6 - 3.4   POC LYMPH PERCENT 33.1 10 - 50 %L   MID (cbc) 0.4 0 - 0.9   POC MID % 7.4 0 - 12 %M   POC Granulocyte 3.0 2 - 6.9     Granulocyte percent 59.5 37 - 80 %G   RBC 5.46 4.04 - 5.48 M/uL   Hemoglobin 14.4 12.2 - 16.2 g/dL   HCT, POC 46.5 37.7 - 47.9 %   MCV 85.1 80 - 97 fL   MCH, POC 26.5 (A) 27 - 31.2 pg   MCHC 31.1 (A) 31.8 - 35.4 g/dL   RDW, POC 13.1 %   Platelet Count, POC 233 142 - 424 K/uL   MPV 7.7 0 - 99.8 fL  POCT urinalysis dipstick  Result Value Ref Range   Color, UA yellow    Clarity, UA clear    Glucose, UA neg    Bilirubin, UA neg    Ketones, UA neg    Spec Grav, UA <=1.005    Blood, UA trace    pH, UA 5.5    Protein, UA neg    Urobilinogen, UA 0.2    Nitrite, UA neg    Leukocytes, UA Negative Negative  POCT UA - Microscopic Only  Result Value Ref Range   WBC, Ur, HPF, POC 0-1    RBC, urine, microscopic 0-2    Bacteria, U Microscopic trace    Mucus, UA neg    Epithelial cells, urine per micros  1-3    Crystals, Ur, HPF, POC neg    Casts, Ur, LPF, POC neg    Yeast, UA neg       Assessment & Plan:   Right-sided low back pain without sciatica - Plan: POCT CBC, POCT urinalysis dipstick, POCT UA - Microscopic Only  Frequency of urination - Plan: POCT CBC, POCT urinalysis dipstick, POCT UA - Microscopic Only --cbc, ua normal reassuring, doubtful for pyelo, nephrolithiasis, gi process --suspect muscular strain  --mobic (don't start till 5 days post colonoscopy), flexeril prn, heat, rest --rtc if no improvement one wk or with fevers, chills, hematuria, or worsening pain  Julieta Gutting, PA-C Physician Assistant-Certified [facility]  03/31/2015 9:00 AM

## 2015-03-31 NOTE — Patient Instructions (Signed)
Your urine sample and white blood cell count were normal which is reassuring. Your pain is most likely from a muscle strain in the area. Continuing to apply heat should help. Taking the flexeril every 8 hours as needed for possible spasm in the area could help. Keep in mind this can make you drowsy. Taking the mobic once daily for the next 2-3 weeks may help with inflammation in the area. If you're not better in one week please come back to see [place], sooner if the pain worsens or with fevers, chills, or blood in the urine.

## 2015-04-15 ENCOUNTER — Encounter (HOSPITAL_COMMUNITY): Payer: Self-pay | Admitting: Emergency Medicine

## 2015-04-15 ENCOUNTER — Emergency Department (HOSPITAL_COMMUNITY)
Admission: EM | Admit: 2015-04-15 | Discharge: 2015-04-15 | Disposition: A | Discharge status: Home / Self Care | Payer: BC Managed Care – PPO | Attending: Emergency Medicine | Admitting: Emergency Medicine

## 2015-04-15 DIAGNOSIS — Z791 Long term (current) use of non-steroidal anti-inflammatories (NSAID): Secondary | ICD-10-CM | POA: Insufficient documentation

## 2015-04-15 DIAGNOSIS — R002 Palpitations: Secondary | ICD-10-CM

## 2015-04-15 DIAGNOSIS — I471 Supraventricular tachycardia: Secondary | ICD-10-CM

## 2015-04-15 DIAGNOSIS — Z87891 Personal history of nicotine dependence: Secondary | ICD-10-CM | POA: Diagnosis not present

## 2015-04-15 DIAGNOSIS — I10 Essential (primary) hypertension: Secondary | ICD-10-CM | POA: Insufficient documentation

## 2015-04-15 DIAGNOSIS — Z8679 Personal history of other diseases of the circulatory system: Secondary | ICD-10-CM

## 2015-04-15 DIAGNOSIS — R000 Tachycardia, unspecified: Secondary | ICD-10-CM | POA: Diagnosis present

## 2015-04-15 DIAGNOSIS — Z79899 Other long term (current) drug therapy: Secondary | ICD-10-CM | POA: Diagnosis not present

## 2015-04-15 LAB — BASIC METABOLIC PANEL
Anion gap: 8 (ref 5–15)
BUN: 11 mg/dL (ref 6–20)
CHLORIDE: 106 mmol/L (ref 101–111)
CO2: 27 mmol/L (ref 22–32)
CREATININE: 0.76 mg/dL (ref 0.44–1.00)
Calcium: 9.4 mg/dL (ref 8.9–10.3)
GFR calc non Af Amer: >60 mL/min (ref >60)
Glucose, Bld: 105 mg/dL — ABNORMAL HIGH (ref 65–99)
POTASSIUM: 3.6 mmol/L (ref 3.5–5.1)
Sodium: 141 mmol/L (ref 135–145)

## 2015-04-15 LAB — CBC WITH DIFFERENTIAL/PLATELET
Basophils Absolute: 0 10*3/uL (ref 0.0–0.1)
Basophils Relative: 1 % (ref 0–1)
EOS ABS: 0.1 10*3/uL (ref 0.0–0.7)
Eosinophils Relative: 2 % (ref 0–5)
HEMATOCRIT: 40.8 % (ref 36.0–46.0)
HEMOGLOBIN: 13.6 g/dL (ref 12.0–15.0)
LYMPHS ABS: 1.4 10*3/uL (ref 0.7–4.0)
LYMPHS PCT: 41 % (ref 12–46)
MCH: 28.8 pg (ref 26.0–34.0)
MCHC: 33.3 g/dL (ref 30.0–36.0)
MCV: 86.4 fL (ref 78.0–100.0)
MONOS PCT: 7 % (ref 3–12)
Monocytes Absolute: 0.2 10*3/uL (ref 0.1–1.0)
NEUTROS ABS: 1.7 10*3/uL (ref 1.7–7.7)
NEUTROS PCT: 49 % (ref 43–77)
Platelets: 195 10*3/uL (ref 150–400)
RBC: 4.72 MIL/uL (ref 3.87–5.11)
RDW: 12.3 % (ref 11.5–15.5)
WBC: 3.3 10*3/uL — AB (ref 4.0–10.5)

## 2015-04-15 LAB — I-STAT TROPONIN, ED: Troponin i, poc: 0 ng/mL (ref 0.00–0.08)

## 2015-04-15 LAB — MAGNESIUM: MAGNESIUM: 1.8 mg/dL (ref 1.7–2.4)

## 2015-04-15 NOTE — Discharge Instructions (Signed)
Supraventricular Tachycardia °Supraventricular tachycardia (SVT) is an abnormal heart rhythm (arrhythmia) that causes the heart to beat very fast (tachycardia). This kind of fast heartbeat originates in the upper chambers of the heart (atria). SVT can cause the heart to beat greater than 100 beats per minute. SVT can have a rapid burst of heartbeats. This can start and stop suddenly without warning and is called nonsustained. SVT can also be sustained, in which the heart beats at a continuous fast rate.  °CAUSES  °There can be different causes of SVT. Some of these include: °· Heart valve problems such as mitral valve prolapse. °· An enlarged heart (hypertrophic cardiomyopathy). °· Congenital heart problems. °· Heart inflammation (pericarditis). °· Hyperthyroidism. °· Low potassium or magnesium levels. °· Caffeine. °· Drug use such as cocaine, methamphetamines, or stimulants. °· Some over-the-counter medicines such as: °¨ Decongestants. °¨ Diet medicines. °¨ Herbal medicines. °SYMPTOMS  °Symptoms of SVT can vary. Symptoms depend on whether the SVT is sustained or nonsustained. You may experience: °· No symptoms (asymptomatic). °· An awareness of your heart beating rapidly (palpitations). °· Shortness of breath. °· Chest pain or pressure. °If your blood pressure drops because of the SVT, you may experience: °· Fainting or near fainting. °· Weakness. °· Dizziness. °DIAGNOSIS  °Different tests can be performed to diagnose SVT, such as: °· An electrocardiogram (EKG). This is a painless test that records the electrical activity of your heart. °· Holter monitor. This is a 24 hour recording of your heart rhythm. You will be given a diary. Write down all symptoms that you have and what you were doing at the time you experienced symptoms. °· Arrhythmia monitor. This is a small device that your wear for several weeks. It records the heart rhythm when you have symptoms. °· Echocardiogram. This is an imaging test to help detect  abnormal heart structure such as congenital abnormalities, heart valve problems, or heart enlargement. °· Stress test. This test can help determine if the SVT is related to exercise. °· Electrophysiology study (EPS). This is a procedure that evaluates your heart's electrical system and can help your caregiver find the cause of your SVT. °TREATMENT  °Treatment of SVT depends on the symptoms, how often it recurs, and whether there are any underlying heart problems.  °· If symptoms are rare and no other cardiac disease is present, no treatment may be needed. °· Blood work may be done to check potassium, magnesium, and thyroid hormone levels to see if they are abnormal. If these levels are abnormal, treatment to correct the problems will occur. °Medicines °Your caregiver may use oral medicines to treat SVT. These medicines are given for long-term control of SVT. Medicines may be used alone or in combination with other treatments. These medicines work to slow nerve impulses in the heart muscle. These medicines can also be used to treat high blood pressure. Some of these medicines may include: °· Calcium channel blockers. °· Beta blockers. °· Digoxin. °Nonsurgical procedures °Nonsurgical techniques may be used if oral medicines do not work. Some examples include: °· Cardioversion. This technique uses either drugs or an electrical shock to restore a normal heart rhythm. °¨ Cardioversion drugs may be given through an intravenous (IV) line to help "reset" the heart rhythm. °¨ In electrical cardioversion, the caregiver shocks your heart to stop its beat for a split second. This helps to reset the heart to a normal rhythm. °· Ablation. This procedure is done under mild sedation. High frequency radio wave energy is used to   destroy the area of heart tissue responsible for the SVT. °HOME CARE INSTRUCTIONS  °· Do not smoke. °· Only take medicines prescribed by your caregiver. Check with your caregiver before using over-the-counter  medicines. °· Check with your caregiver about how much alcohol and caffeine (coffee, tea, colas, or chocolate) you may have. °· It is very important to keep all follow-up referrals and appointments in order to properly manage this problem. °SEEK IMMEDIATE MEDICAL CARE IF: °· You have dizziness. °· You faint or nearly faint. °· You have shortness of breath. °· You have chest pain or pressure. °· You have sudden nausea or vomiting. °· You have profuse sweating. °· You are concerned about how long your symptoms last. °· You are concerned about the frequency of your SVT episodes. °If you have the above symptoms, call your local emergency services (911 in [place] immediately. Do not drive yourself to the hospital. °MAKE SURE YOU:  °· Understand these instructions. °· Will watch your condition. °· Will get help right away if you are not doing well or get worse. °Document Released: 07/21/2005 Document Revised: 10/13/2011 Document Reviewed: 11/02/2008 °ExitCare® Patient Information ©2015 [employer]. This information is not intended to replace advice given to you by your health care provider. Make sure you discuss any questions you have with your health care provider. ° °

## 2015-04-15 NOTE — ED Notes (Signed)
Pt. Stated, I have [place] white [facility]

## 2015-04-15 NOTE — ED Notes (Signed)
Pt is in stable condition upon d/c and ambulates from ED with staff. 

## 2015-04-15 NOTE — ED Notes (Signed)
Pt. Stated, I got up this morning and my heart was racing.

## 2015-04-15 NOTE — ED Provider Notes (Signed)
CSN: [id number]     Arrival date & time 04/15/15  0708 History   First MD Initiated Contact with Patient 04/15/15 [phone]     Chief Complaint  Patient presents with  . Tachycardia     (Consider location/radiation/quality/duration/timing/severity/associated sxs/prior Treatment) Patient is a 64 y.o. female presenting with palpitations. The history is provided by the patient.  Palpitations Palpitations quality:  Regular Onset quality:  Sudden Duration:  1 hour Timing:  Sporadic Progression:  Resolved Chronicity:  Recurrent Context: not appetite suppressants, not caffeine and not stimulant use   Relieved by:  Nothing Worsened by:  Nothing Ineffective treatments:  None tried Associated symptoms: no chest pain, no cough, no near-syncope, no shortness of breath, no syncope, no vomiting and no weakness   Risk factors comment:  H/o WPW   Past Medical History  Diagnosis Date  . Hypertension   . WPW (Wolff-Parkinson-White syndrome)    Past Surgical History  Procedure Laterality Date  . Laparoscopic drainage of ovarian cyst     Family History  Problem Relation Age of Onset  . Heart disease Mother   . Heart failure Mother   . Hypertension Father   . Breast cancer Paternal Aunt   . Diabetes Sister   . Hypertension Sister    Social History  Substance Use Topics  . Smoking status: Former Smoker    Quit date: 10/28/1980  . Smokeless tobacco: Never Used  . Alcohol Use: 0.0 oz/week    0 Standard drinks or equivalent per week     Comment: occ   OB History    No data available     Review of Systems  Respiratory: Negative for cough and shortness of breath.   Cardiovascular: Positive for palpitations. Negative for chest pain, syncope and near-syncope.  Gastrointestinal: Negative for vomiting.  Neurological: Negative for weakness.  All other systems reviewed and are negative.     Allergies  Sulfamethoxazole-trimethoprim  Home Medications   Prior to Admission medications    Medication Sig Start Date End Date Taking? Authorizing Provider  cholecalciferol (VITAMIN D) 1000 UNITS tablet Take 1,000 Units by mouth daily.    Historical Provider, MD  cyclobenzaprine (FLEXERIL) 5 MG tablet Take 1 tablet (5 mg total) by mouth 3 (three) times daily as needed for muscle spasms. 03/31/15   Todd McVeigh, PA  Lactobacillus Rhamnosus, GG, (CULTURELLE) CAPS Take 1 capsule by mouth daily.    Historical Provider, MD  lisinopril (PRINIVIL,ZESTRIL) 10 MG tablet Take 1 tablet (10 mg total) by mouth daily. 03/07/15   Marletta Lor, MD  meloxicam (MOBIC) 7.5 MG tablet Take 1 tablet (7.5 mg total) by mouth daily. 03/31/15   Araceli Bouche, PA  Multiple Vitamin (MULTIVITAMIN) tablet Take 1 tablet by mouth daily.      Historical Provider, MD   BP 155/89 mmHg  Pulse 84  Temp(Src) 98.5 F (36.9 C) (Oral)  Resp 16  Ht 5' 7.5" (1.715 m)  Wt 164 lb (74.39 kg)  BMI 25.29 kg/m2  SpO2 97% Physical Exam  Constitutional: She is oriented to person, place, and time. She appears well-developed and well-nourished. No distress.  HENT:  Head: Normocephalic.  Eyes: Conjunctivae are normal.  Neck: Neck supple. No tracheal deviation present.  Cardiovascular: Normal rate, regular rhythm and normal heart sounds.   No murmur heard. Pulmonary/Chest: Effort normal. No respiratory distress.  Abdominal: Soft. She exhibits no distension.  Neurological: She is alert and oriented to person, place, and time.  Skin: Skin is warm and dry.  Psychiatric: She has a normal mood and affect.    ED Course  Procedures (including critical care time) Labs Review Labs Reviewed  BASIC METABOLIC PANEL - Abnormal; Notable for the following:    Glucose, Bld 105 (*)    All other components within normal limits  CBC WITH DIFFERENTIAL/PLATELET - Abnormal; Notable for the following:    WBC 3.3 (*)    All other components within normal limits  MAGNESIUM  I-STAT TROPOININ, ED    Imaging Review No results found. I  have personally reviewed and evaluated these images and lab results as part of my medical decision-making.   EKG Interpretation   Date/Time:  Sunday April 15 2015 07:11:31 EDT Ventricular Rate:  84 PR Interval:  148 QRS Duration: 90 QT Interval:  380 QTC Calculation: 449 R Axis:   91 Text Interpretation:  Normal sinus rhythm Normal ECG Confirmed by Marco Raper  MD, Jotham Ahn ([id number]) on 04/15/2015 7:31:50 AM      MDM   Final diagnoses:  History of Wolff-Parkinson-White (WPW) syndrome  Heart palpitations  Paroxysmal SVT (supraventricular tachycardia)    64 year old female presents with intermittent palpitations increasing over the last few weeks. She has a history of WPW. This morning she had a longer than normal episode where she felt her heart palpitations but they did not subside for over an hour causing her to present to the emergency department. By the time she checked in triage her symptoms are completely resolved. She is in sinus rhythm here without signs of preexcitation on her resting EKG. No signs of ischemic or metabolic etiologies for arrhythmia. No Brugada or prolonged QT. Symptoms are likely supraventricular in nature and have responded to vagal stimulation previously. Patient has not had cardiology follow-up for many years, I recommended that she follow up with a cardiologist to discuss abortive medications or other interventions on an outpatient basis. Return precautions and instructions to call 911 discussed with presyncope, syncope, chest pain, shortness of breath or other new or concerning symptoms.  Leo Grosser, MD 04/15/15 [phone]

## 2015-05-08 ENCOUNTER — Ambulatory Visit (INDEPENDENT_AMBULATORY_CARE_PROVIDER_SITE_OTHER): Payer: BC Managed Care – PPO | Admitting: Internal Medicine

## 2015-05-08 ENCOUNTER — Encounter: Payer: Self-pay | Admitting: Internal Medicine

## 2015-05-08 VITALS — BP 124/82 | HR 75 | Ht 68.0 in | Wt 165.8 lb

## 2015-05-08 DIAGNOSIS — I456 Pre-excitation syndrome: Secondary | ICD-10-CM | POA: Diagnosis not present

## 2015-05-08 DIAGNOSIS — I10 Essential (primary) hypertension: Secondary | ICD-10-CM

## 2015-05-08 NOTE — Assessment & Plan Note (Signed)
She no longer has pre-excitation. I discussed the treatment options. She is symptomatic. She is having symptomatic SVT. I have recommended she wear a cardiac monitor. If she has more SVT, then catheter ablation would be strongly considered. She will call us if she would like to proceed with cardiac monitoring.

## 2015-05-08 NOTE — Progress Notes (Signed)
      HPI Christine Livingston is referred today by Dr. Burnice Logan for evaluation of WPW syndrome. She is a pleasant 64 yo woman who I saw 10 years ago. At that time she had ventricular pre-excitation but minimal symptoms. She has developed worsening palpitations. She went to the ED several weeks ago with palpitations and the sensation of heart racing but these stopped just prior to her arrival. She is not on any medical therapy. She has not had syncope. Her ECG demonstrates no ventricular pre-excitation at this point.  Allergies  Allergen Reactions  . Bactrim [Sulfamethoxazole-Trimethoprim]     REACTION: leg cramps & rash     Current Outpatient Prescriptions  Medication Sig Dispense Refill  . cholecalciferol (VITAMIN D) 1000 UNITS tablet Take 1,000 Units by mouth daily.    [name] lisinopril (PRINIVIL,ZESTRIL) 10 MG tablet Take 1 tablet (10 mg total) by mouth daily. 90 tablet 3  . Multiple Vitamin (MULTIVITAMIN) tablet Take 1 tablet by mouth daily.       No current facility-administered medications for this visit.     Past Medical History  Diagnosis Date  . Hypertension   . WPW (Wolff-Parkinson-White syndrome)     ROS:   All systems reviewed and negative except as noted in the HPI.   Past Surgical History  Procedure Laterality Date  . Laparoscopic drainage of ovarian cyst       Family History  Problem Relation Age of Onset  . Heart disease Mother   . Heart failure Mother   . Hypertension Father   . Breast cancer Paternal Aunt   . Diabetes Sister   . Hypertension Sister      Social History   Social History  . Marital Status: Married    Spouse Name: N/A  . Number of Children: N/A  . Years of Education: N/A   Occupational History  . Not on file.   Social History Main Topics  . Smoking status: Former Smoker    Quit date: 10/28/1980  . Smokeless tobacco: Never Used  . Alcohol Use: 0.0 oz/week    0 Standard drinks or equivalent per week     Comment: occ  . Drug Use:  Not on file  . Sexual Activity: Not on file   Other Topics Concern  . Not on file   Social History Narrative     BP 124/82 mmHg  Pulse 75  Ht 5\' 8"  (1.727 m)  Wt 165 lb 12.8 oz (75.206 kg)  BMI 25.22 kg/m2  Physical Exam:  Well appearing 64 yo woman, NAD HEENT: Unremarkable Neck:  6 cm JVD, no thyromegally Lymphatics:  No adenopathy Back:  No CVA tenderness Lungs:  Clear with no wheezes HEART:  Regular rate rhythm, no murmurs, no rubs, no clicks Abd:  soft, positive bowel sounds, no organomegally, no rebound, no guarding Ext:  2 plus pulses, no edema, no cyanosis, no clubbing Skin:  No rashes no nodules Neuro:  CN II through XII intact, motor grossly intact  EKG - NSR  Assess/Plan:

## 2015-05-08 NOTE — Assessment & Plan Note (Signed)
Her blood pressure is well controlled on lisinopril. Will follow.

## 2015-05-08 NOTE — Patient Instructions (Signed)
Medication Instructions:  Your physician recommends that you continue on your current medications as directed. Please refer to the Current Medication list given to you today.   Labwork: None ordered   Testing/Procedures: None ordered   Follow-Up: Your physician recommends that you schedule a follow-up appointment as needed   Any Other Special Instructions Will Be Listed Below (If Applicable).  Call back if you decide to wear a event monitor for your SVT

## 2015-05-26 ENCOUNTER — Encounter: Payer: Self-pay | Admitting: Internal Medicine

## 2015-08-10 ENCOUNTER — Encounter: Payer: Self-pay | Admitting: Family Medicine

## 2015-08-10 ENCOUNTER — Ambulatory Visit (INDEPENDENT_AMBULATORY_CARE_PROVIDER_SITE_OTHER): Payer: BC Managed Care – PPO | Admitting: Family Medicine

## 2015-08-10 VITALS — BP 136/80 | HR 82 | Temp 98.7°F | Ht 68.0 in | Wt 169.4 lb

## 2015-08-10 DIAGNOSIS — M545 Low back pain, unspecified: Secondary | ICD-10-CM

## 2015-08-10 DIAGNOSIS — R350 Frequency of micturition: Secondary | ICD-10-CM | POA: Diagnosis not present

## 2015-08-10 LAB — POCT URINALYSIS DIPSTICK
Bilirubin, UA: NEGATIVE
Glucose, UA: NEGATIVE
KETONES UA: NEGATIVE
LEUKOCYTES UA: NEGATIVE
NITRITE UA: NEGATIVE
PH UA: 6.5
PROTEIN UA: NEGATIVE
Spec Grav, UA: 1.02
Urobilinogen, UA: 0.2

## 2015-08-10 LAB — URINALYSIS, MICROSCOPIC ONLY: RBC / HPF: NONE SEEN (ref 0–?)

## 2015-08-10 MED ORDER — CYCLOBENZAPRINE HCL 5 MG PO TABS: 5.0000 mg | ORAL_TABLET | Freq: Three times a day (TID) | ORAL | Status: DC | PRN

## 2015-08-10 NOTE — Patient Instructions (Addendum)
Schedule follow up with your doctor in 1 month. Low back exercises.  Can use heat, muscle relaxer and aleve or tylenol per instructions as needed for back issues. Do the exercises provided 3 days per week.  We are doing some further tests on the urine. If any worsening urinary symptoms, vomiting, fevers or concerns or persistent symptoms seek care immediately.

## 2015-08-10 NOTE — Progress Notes (Signed)
Pre visit review using our clinic review tool, if applicable. No additional management support is needed unless otherwise documented below in the visit note. 

## 2015-08-10 NOTE — Progress Notes (Signed)
HPI:  Acute visit for:  Dysuria: -started yesterday -reports has had this many times the past and has hx of UTI so wanted to check before the weekend -symptoms: frequency, R low back pain (see below) -denies:fevers, chills, malaise, vag symptoms, NV, hematuria, hx stones  R low back pain: -started several days ago -reports long hx of intermittent flares of R low back pain that has at times turned out to be a UTI -back issues feel like muscle spasm and respond to warm heat and aleve  -no radiation, weakness, numbness, bowel or bladder incontinenece  ROS: See pertinent positives and negatives per HPI.  Past Medical History  Diagnosis Date  . Hypertension   . WPW (Wolff-Parkinson-White syndrome)     Past Surgical History  Procedure Laterality Date  . Laparoscopic drainage of ovarian cyst      Family History  Problem Relation Age of Onset  . Heart disease Mother   . Heart failure Mother   . Hypertension Father   . Breast cancer Paternal Aunt   . Diabetes Sister   . Hypertension Sister     Social History   Social History  . Marital Status: Married    Spouse Name: N/A  . Number of Children: N/A  . Years of Education: N/A   Social History Main Topics  . Smoking status: Former Smoker    Quit date: 10/28/1980  . Smokeless tobacco: Never Used  . Alcohol Use: 0.0 oz/week    0 Standard drinks or equivalent per week     Comment: occ  . Drug Use: None  . Sexual Activity: Not Asked   Other Topics Concern  . None   Social History Narrative     Current outpatient prescriptions:  .  lisinopril (PRINIVIL,ZESTRIL) 10 MG tablet, Take 1 tablet (10 mg total) by mouth daily., Disp: 90 tablet, Rfl: 3 .  Multiple Vitamin (MULTIVITAMIN) tablet, Take 1 tablet by mouth daily.  , Disp: , Rfl:  .  cyclobenzaprine (FLEXERIL) 5 MG tablet, Take 1 tablet (5 mg total) by mouth 3 (three) times daily as needed for muscle spasms., Disp: 30 tablet, Rfl: 1  EXAM:  Filed Vitals:   08/10/15 1356  BP: 136/80  Pulse: 82  Temp: 98.7 F (37.1 C)    Body mass index is 25.76 kg/(m^2).  GENERAL: vitals reviewed and listed above, alert, oriented, appears well hydrated and in no acute distress  HEENT: atraumatic, conjunttiva clear, no obvious abnormalities on inspection of external nose and ears  NECK: no obvious masses on inspection  LUNGS: clear to auscultation bilaterally, no wheezes, rales or rhonchi, good air movement  ABD: soft, NTTP, no CVA TTP  CV: HRRR, no peripheral edema  MS: moves all extremities without noticeable abnormality, normal gait, appears comfortable, some TTP in R upper lumbar paraspinal muscles, normal LE strength, sensation to light touch neg SLRT/neg CLRT  PSYCH: pleasant and cooperative, no obvious depression or anxiety  ASSESSMENT AND PLAN:  Discussed the following assessment and plan:  Urinary frequency - Plan: POC Urinalysis Dipstick -udip with min hgb and o/w normal, given the excessive frequency of this finding on our dips - opted after discussion with pt to get umicro and culture -discussed options over the weekend if symptoms were to worsen suggesting UTI, nephrolithiasis vs other -if blood on micro and no infection recheck in 1 month and consider CT stone study if persistent or worsening symptoms  Right-sided low back pain without sciatica -this seems more likely muscular, in setting of  above discussed other etiologies and prompt follow up further eval if worsening or persistent and pending further studies -in interim opted for flexeril and OTC analgesic and heat which have helped -follow up in 1 month or sooner as needed  -Patient advised to return or notify a doctor immediately if symptoms worsen or persist or new concerns arise.  Patient Instructions  Can use heat, muscle relaxer and aleve or tylenol per instructions as needed for back issues  We are doing some further tests on the urine. If any worsening urinary  symptoms, vomiting, fevers or concerns or persistent symptoms seek care immediately.  Follow up if any persistent symptoms     KIM, HANNAH R.

## 2015-08-12 LAB — URINE CULTURE
COLONY COUNT: NO GROWTH
ORGANISM ID, BACTERIA: NO GROWTH

## 2015-09-27 LAB — HM MAMMOGRAPHY

## 2015-10-04 ENCOUNTER — Encounter: Payer: Self-pay | Admitting: Internal Medicine

## 2015-12-20 ENCOUNTER — Ambulatory Visit (INDEPENDENT_AMBULATORY_CARE_PROVIDER_SITE_OTHER): Payer: BC Managed Care – PPO | Admitting: Family Medicine

## 2015-12-20 ENCOUNTER — Emergency Department (HOSPITAL_COMMUNITY)
Admission: EM | Admit: 2015-12-20 | Discharge: 2015-12-20 | Disposition: A | Discharge status: Home / Self Care | Payer: BC Managed Care – PPO | Attending: Emergency Medicine | Admitting: Emergency Medicine

## 2015-12-20 ENCOUNTER — Encounter: Payer: Self-pay | Admitting: Family Medicine

## 2015-12-20 ENCOUNTER — Emergency Department (HOSPITAL_COMMUNITY): Payer: BC Managed Care – PPO

## 2015-12-20 ENCOUNTER — Encounter (HOSPITAL_COMMUNITY): Payer: Self-pay

## 2015-12-20 VITALS — BP 160/100 | HR 76 | Temp 98.2°F | Resp 20 | Ht 68.0 in | Wt 168.5 lb

## 2015-12-20 DIAGNOSIS — R109 Unspecified abdominal pain: Secondary | ICD-10-CM | POA: Diagnosis not present

## 2015-12-20 DIAGNOSIS — M545 Low back pain, unspecified: Secondary | ICD-10-CM

## 2015-12-20 DIAGNOSIS — Z87891 Personal history of nicotine dependence: Secondary | ICD-10-CM | POA: Insufficient documentation

## 2015-12-20 DIAGNOSIS — Z79899 Other long term (current) drug therapy: Secondary | ICD-10-CM | POA: Diagnosis not present

## 2015-12-20 DIAGNOSIS — R350 Frequency of micturition: Secondary | ICD-10-CM | POA: Diagnosis not present

## 2015-12-20 DIAGNOSIS — I10 Essential (primary) hypertension: Secondary | ICD-10-CM | POA: Insufficient documentation

## 2015-12-20 LAB — URINALYSIS, ROUTINE W REFLEX MICROSCOPIC
BILIRUBIN URINE: NEGATIVE
Glucose, UA: NEGATIVE mg/dL
Ketones, ur: NEGATIVE mg/dL
NITRITE: NEGATIVE
PH: 7 (ref 5.0–8.0)
Protein, ur: NEGATIVE mg/dL
SPECIFIC GRAVITY, URINE: 1.008 (ref 1.005–1.030)

## 2015-12-20 LAB — COMPREHENSIVE METABOLIC PANEL
ALK PHOS: 69 U/L (ref 38–126)
ALT: 23 U/L (ref 14–54)
ANION GAP: 9 (ref 5–15)
AST: 27 U/L (ref 15–41)
Albumin: 4.1 g/dL (ref 3.5–5.0)
BILIRUBIN TOTAL: 0.8 mg/dL (ref 0.3–1.2)
BUN: 11 mg/dL (ref 6–20)
CALCIUM: 9.1 mg/dL (ref 8.9–10.3)
CO2: 26 mmol/L (ref 22–32)
Chloride: 102 mmol/L (ref 101–111)
Creatinine, Ser: 0.69 mg/dL (ref 0.44–1.00)
GFR calc non Af Amer: >60 mL/min (ref >60)
Glucose, Bld: 98 mg/dL (ref 65–99)
Potassium: 3.6 mmol/L (ref 3.5–5.1)
Sodium: 137 mmol/L (ref 135–145)
TOTAL PROTEIN: 6.6 g/dL (ref 6.5–8.1)

## 2015-12-20 LAB — CBC WITH DIFFERENTIAL/PLATELET
Basophils Absolute: 0 10*3/uL (ref 0.0–0.1)
Basophils Relative: 1 %
Eosinophils Absolute: 0 10*3/uL (ref 0.0–0.7)
Eosinophils Relative: 1 %
HEMATOCRIT: 39.6 % (ref 36.0–46.0)
HEMOGLOBIN: 13.7 g/dL (ref 12.0–15.0)
LYMPHS ABS: 1.5 10*3/uL (ref 0.7–4.0)
Lymphocytes Relative: 22 %
MCH: 29.5 pg (ref 26.0–34.0)
MCHC: 34.6 g/dL (ref 30.0–36.0)
MCV: 85.3 fL (ref 78.0–100.0)
MONOS PCT: 6 %
Monocytes Absolute: 0.4 10*3/uL (ref 0.1–1.0)
NEUTROS ABS: 4.7 10*3/uL (ref 1.7–7.7)
NEUTROS PCT: 70 %
Platelets: 200 10*3/uL (ref 150–400)
RBC: 4.64 MIL/uL (ref 3.87–5.11)
RDW: 12.7 % (ref 11.5–15.5)
WBC: 6.6 10*3/uL (ref 4.0–10.5)

## 2015-12-20 LAB — LIPASE, BLOOD: Lipase: 37 U/L (ref 11–51)

## 2015-12-20 LAB — URINE MICROSCOPIC-ADD ON

## 2015-12-20 MED ORDER — IBUPROFEN 800 MG PO TABS
800.0000 mg | ORAL_TABLET | Freq: Once | ORAL | Status: AC
  Administered 2015-12-20: 800 mg via ORAL
  Filled 2015-12-20: qty 1

## 2015-12-20 MED ORDER — ONDANSETRON HCL 4 MG/2ML IJ SOLN
4.0000 mg | Freq: Once | INTRAMUSCULAR | Status: AC
  Administered 2015-12-20: 4 mg via INTRAVENOUS
  Filled 2015-12-20: qty 2

## 2015-12-20 MED ORDER — CEPHALEXIN 500 MG PO CAPS: 500.0000 mg | ORAL_CAPSULE | Freq: Four times a day (QID) | ORAL | Status: DC

## 2015-12-20 MED ORDER — DIAZEPAM 2 MG PO TABS
2.0000 mg | ORAL_TABLET | Freq: Once | ORAL | Status: AC
  Administered 2015-12-20: 2 mg via ORAL
  Filled 2015-12-20: qty 1

## 2015-12-20 MED ORDER — SODIUM CHLORIDE 0.9 % IV BOLUS (SEPSIS)
1000.0000 mL | Freq: Once | INTRAVENOUS | Status: AC
  Administered 2015-12-20: 1000 mL via INTRAVENOUS

## 2015-12-20 MED ORDER — MORPHINE SULFATE (PF) 4 MG/ML IV SOLN
4.0000 mg | Freq: Once | INTRAVENOUS | Status: AC
  Administered 2015-12-20: 4 mg via INTRAVENOUS
  Filled 2015-12-20: qty 1

## 2015-12-20 NOTE — Patient Instructions (Signed)
Please go to ER for further evaluation of back pain and urinary frequency.

## 2015-12-20 NOTE — Progress Notes (Signed)
Pre visit review using our clinic review tool, if applicable. No additional management support is needed unless otherwise documented below in the visit note. 

## 2015-12-20 NOTE — ED Notes (Signed)
Patient here with right flank pain x 2 days. Reports history of same that usually resolves. Denies dysuria. Slight nausea with same.

## 2015-12-20 NOTE — ED Provider Notes (Signed)
CSN: [id number]     Arrival date & time 12/20/15  [id number] History   First MD Initiated Contact with Patient 12/20/15 [phone]     Chief Complaint  Patient presents with  . Flank Pain     (Consider location/radiation/quality/duration/timing/severity/associated sxs/prior Treatment) Patient is a 65 y.o. female presenting with flank pain.  Flank Pain This is a new problem. The current episode started less than 1 hour ago. The problem occurs constantly. The problem has not changed since onset.Pertinent negatives include no chest pain, no abdominal pain, no headaches and no shortness of breath. Nothing aggravates the symptoms. Nothing relieves the symptoms. She has tried nothing for the symptoms. The treatment provided no relief.    65 yo F With a chief complaint of right flank pain. This pain is sharp and stabbing comes and goes. Denies radiation. Denies any worsening or alleviating factors. Denies injury. Denies fevers or chills or urinary symptoms. Has had pain like this in the past was attributed to a possible back spasm. Resolved on its own. Denies any nausea or vomiting.  Past Medical History  Diagnosis Date  . Hypertension   . WPW (Wolff-Parkinson-White syndrome)    Past Surgical History  Procedure Laterality Date  . Laparoscopic drainage of ovarian cyst     Family History  Problem Relation Age of Onset  . Heart disease Mother   . Heart failure Mother   . Hypertension Father   . Breast cancer Paternal Aunt   . Diabetes Sister   . Hypertension Sister    Social History  Substance Use Topics  . Smoking status: Former Smoker    Quit date: 10/28/1980  . Smokeless tobacco: Never Used  . Alcohol Use: 0.0 oz/week    0 Standard drinks or equivalent per week     Comment: occ   OB History    No data available     Review of Systems  Constitutional: Negative for fever and chills.  HENT: Negative for congestion and rhinorrhea.   Eyes: Negative for redness and visual disturbance.   Respiratory: Negative for shortness of breath and wheezing.   Cardiovascular: Negative for chest pain and palpitations.  Gastrointestinal: Negative for nausea, vomiting and abdominal pain.  Genitourinary: Positive for flank pain. Negative for dysuria and urgency.  Musculoskeletal: Negative for myalgias and arthralgias.  Skin: Negative for pallor and wound.  Neurological: Negative for dizziness and headaches.      Allergies  Bactrim  Home Medications   Prior to Admission medications   Medication Sig Start Date End Date Taking? Authorizing Provider  cyclobenzaprine (FLEXERIL) 5 MG tablet Take 1 tablet (5 mg total) by mouth 3 (three) times daily as needed for muscle spasms. 08/10/15  Yes Lucretia Kern, DO  lisinopril (PRINIVIL,ZESTRIL) 10 MG tablet Take 1 tablet (10 mg total) by mouth daily. 03/07/15  Yes Marletta Lor, MD  Multiple Vitamin (MULTIVITAMIN) tablet Take 1 tablet by mouth daily.     Yes Historical Provider, MD  Probiotic Product (PRO-BIOTIC BLEND PO) Take 1 capsule by mouth daily.   Yes Historical Provider, MD  cephALEXin (KEFLEX) 500 MG capsule Take 1 capsule (500 mg total) by mouth 4 (four) times daily. 12/20/15   Deno Etienne, DO   BP 133/76 mmHg  Pulse 57  Temp(Src) 98 F (36.7 C) (Oral)  Resp 18  Ht 5\' 7"  (1.702 m)  Wt 168 lb (76.204 kg)  BMI 26.31 kg/m2  SpO2 98% Physical Exam  Constitutional: She is oriented to person, place, and time.  She appears well-developed and well-nourished. No distress.  HENT:  Head: Normocephalic and atraumatic.  Eyes: EOM are normal. Pupils are equal, round, and reactive to light.  Neck: Normal range of motion. Neck supple.  Cardiovascular: Normal rate and regular rhythm.  Exam reveals no gallop and no friction rub.   No murmur heard. Pulmonary/Chest: Effort normal. She has no wheezes. She has no rales.  Abdominal: Soft. She exhibits no distension. There is no tenderness. There is no rebound and no guarding.  Musculoskeletal: She  exhibits tenderness (mild left flank). She exhibits no edema.  Neurological: She is alert and oriented to person, place, and time.  Skin: Skin is warm and dry. She is not diaphoretic. There is erythema (over the right flank).  Psychiatric: She has a normal mood and affect. Her behavior is normal.  Nursing note and vitals reviewed.   ED Course  Procedures (including critical care time) Labs Review Labs Reviewed  URINALYSIS, ROUTINE W REFLEX MICROSCOPIC (NOT AT [facility]) - Abnormal; Notable for the following:    Hgb urine dipstick SMALL (*)    Leukocytes, UA LARGE (*)    All other components within normal limits  URINE MICROSCOPIC-ADD ON - Abnormal; Notable for the following:    Squamous Epithelial / LPF 0-5 (*)    Bacteria, UA RARE (*)    All other components within normal limits  CBC WITH DIFFERENTIAL/PLATELET  COMPREHENSIVE METABOLIC PANEL  LIPASE, BLOOD    Imaging Review Ct Renal Stone Study  12/20/2015  CLINICAL DATA:  Three-day history of right flank pain EXAM: CT ABDOMEN AND PELVIS WITHOUT CONTRAST TECHNIQUE: Multidetector CT imaging of the abdomen and pelvis was performed following the standard protocol without oral or intravenous contrast material administration. COMPARISON:  April 29, 2007 CT abdomen and pelvis and pelvic ultrasound May 17, 2007 FINDINGS: Lower chest: There is slight posterior bibasilar atelectasis. Lung bases are otherwise clear. Hepatobiliary: No focal liver lesions are identified on this noncontrast enhanced study. Gallbladder wall is not thickened. There is no biliary duct dilatation. Pancreas: No pancreatic mass or inflammatory focus. Spleen: No splenic lesions are evident. A small accessory spleen is noted medial to the spleen. Adrenals/Urinary Tract: Adrenals appear normal bilaterally. Kidneys bilaterally show no mass or hydronephrosis on either side. There is no renal or ureteral calculus on either side. Urinary bladder is midline with wall thickness  within normal limits. Stomach/Bowel: There is no bowel wall or mesenteric thickening. No bowel obstruction. No free air or portal venous air. Vascular/Lymphatic: There are foci of atherosclerotic calcification in the aorta and proximal common iliac arteries. There is no abdominal aortic aneurysm. Major mesenteric vessels show no appreciable calcification. No adenopathy is evident in the abdomen or pelvis. Reproductive: There is again noted a mass in the right adnexal region which appears to arise eccentrically from the rightward aspect of the uterus. This mass measures 4.9 x 4.2 x 4.2 cm and appears slightly smaller compared to the previous study from 2008. 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 No other pelvic mass is appreciable. Neither ovary is convincingly seen on CT. There is no pelvic fluid collection. Other: The appendix appears normal. There is a small ventral hernia containing only fat. There is no abscess or ascites in the abdomen or pelvis. Musculoskeletal: There is degenerative change at L4-5 with disc space narrowing at this level. There are no blastic or lytic bone lesions. There is no intramuscular or abdominal wall lesion. 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 No new pelvic mass. Prominent liver without focal lesion on this noncontrast enhanced study. No renal or ureteral calculus.  No hydronephrosis. Appendix appears normal. No bowel obstruction. No abscess. Proximal sigmoid diverticula present without apparent diverticulitis. Electronically Signed   By: Lowella Grip III M.D.   On: 12/20/2015 11:00   I have personally reviewed and evaluated these images and lab results as part of my medical decision-making.   EKG Interpretation None      MDM   Final diagnoses:  Right flank pain    65 yo F with a chief complaint of right flank pain. History  concerning for possible nephrolithiasis. Will obtain a CT stone study she's never been diagnosed with this before. No abdominal tenderness. No urinary complaints. Pain and nausea control.  Symptoms improved, ct scan negative for stone.  UA with possible UTI, no urinary symptoms, treat with keflex with R flank pain.  PCP follow up.   2:14 PM:  I have discussed the diagnosis/risks/treatment options with the patient and family and believe the pt to be eligible for discharge home to follow-up with PCP. We also discussed returning to the ED immediately if new or worsening sx occur. We discussed the sx which are most concerning (e.g., sudden worsening pain, fever, inability to tolerate by mouth) that necessitate immediate return. Medications administered to the patient during their visit and any new prescriptions provided to the patient are listed below.  Medications given during this visit Medications  sodium chloride 0.9 % bolus 1,000 mL (0 mLs Intravenous Stopped 12/20/15 1211)  morphine 4 MG/ML injection 4 mg (4 mg Intravenous Given 12/20/15 1030)  ondansetron (ZOFRAN) injection 4 mg (4 mg Intravenous Given 12/20/15 1029)  diazepam (VALIUM) tablet 2 mg (2 mg Oral Given 12/20/15 1028)  ibuprofen (ADVIL,MOTRIN) tablet 800 mg (800 mg Oral Given 12/20/15 1213)    Discharge Medication List as of 12/20/2015 11:41 AM    START taking these medications   Details  cephALEXin (KEFLEX) 500 MG capsule Take 1 capsule (500 mg total) by mouth 4 (four) times daily., Starting 12/20/2015, Until Discontinued, Print        The patient appears reasonably screen and/or stabilized for discharge and I doubt any other medical condition or other [facility] requiring further screening, evaluation, or treatment in the ED at this time prior to discharge.     Deno Etienne, DO 12/20/15 1414

## 2015-12-20 NOTE — Discharge Instructions (Signed)
Follow up with your family doctor.  Flank Pain Flank pain refers to pain that is located on the side of the body between the upper abdomen and the back. The pain may occur over a short period of time (acute) or may be long-term or reoccurring (chronic). It may be mild or severe. Flank pain can be caused by many things. CAUSES  Some of the more common causes of flank pain include:  Muscle strains.   Muscle spasms.   A disease of your spine (vertebral disk disease).   A lung infection (pneumonia).   Fluid around your lungs (pulmonary edema).   A kidney infection.   Kidney stones.   A very painful skin rash caused by the chickenpox virus (shingles).   Gallbladder disease.  Brady care will depend on the cause of your pain. In general,  Rest as directed by your caregiver.  Drink enough fluids to keep your urine clear or pale yellow.  Only take over-the-counter or prescription medicines as directed by your caregiver. Some medicines may help relieve the pain.  Tell your caregiver about any changes in your pain.  Follow up with your caregiver as directed. SEEK IMMEDIATE MEDICAL CARE IF:   Your pain is not controlled with medicine.   You have new or worsening symptoms.  Your pain increases.   You have abdominal pain.   You have shortness of breath.   You have persistent nausea or vomiting.   You have swelling in your abdomen.   You feel faint or pass out.   You have blood in your urine.  You have a fever or persistent symptoms for more than 2-3 days.  You have a fever and your symptoms suddenly get worse. MAKE SURE YOU:   Understand these instructions.  Will watch your condition.  Will get help right away if you are not doing well or get worse.   This information is not intended to replace advice given to you by your health care provider. Make sure you discuss any questions you have with your health care provider.     Document Released: 09/11/2005 Document Revised: 04/14/2012 Document Reviewed: 03/04/2012 Elsevier Interactive Patient Education [employer].

## 2015-12-20 NOTE — Progress Notes (Signed)
Subjective:    Patient ID: Christine Livingston, female    DOB: 07-05-1951, 65 y.o.   MRN: [id number]  HPI  Patient presents with acute right sided back pain that is rated as a 10 and stabbing. She is lying on the exam table stating "this feels like I am having a baby." The pain is noted as sharp constantly with periods of stabbing pain that does not radiate. She is unable to remain still on the exam table from the pain. Associated symptom of urinary frequency has been noted. She states that pain in this area occurred in April but not to this degree and she did not seek care because it resolved.  The pain has been increasing and she has noted severe pain for 1 day with increasing severity during the night. She denies N/V, fever, chills or sweats. Pertinent history of UTIs and she was also seen in 08/2015 for similar symptoms and a CT stone study was suggested if she had persistent or worsening symptoms. Treatment at home includes heat and use of advil which has not provided benefit.   Review of Systems  Constitutional: Negative for fever and chills.  Respiratory: Negative for cough and shortness of breath.   Cardiovascular: Negative for chest pain, palpitations and leg swelling.  Gastrointestinal: Negative for nausea, vomiting, abdominal pain and diarrhea.  Genitourinary: Positive for urgency and frequency.  Musculoskeletal: Positive for back pain.  Skin: Negative for rash.  Neurological: Negative for dizziness.   Past Medical History  Diagnosis Date  . Hypertension   . WPW (Wolff-Parkinson-White syndrome)      Social History   Social History  . Marital Status: Married    Spouse Name: N/A  . Number of Children: N/A  . Years of Education: N/A   Occupational History  . Not on file.   Social History Main Topics  . Smoking status: Former Smoker    Quit date: 10/28/1980  . Smokeless tobacco: Never Used  . Alcohol Use: 0.0 oz/week    0 Standard drinks or equivalent per week     Comment:  occ  . Drug Use: Not on file  . Sexual Activity: Not on file   Other Topics Concern  . Not on file   Social History Narrative    Past Surgical History  Procedure Laterality Date  . Laparoscopic drainage of ovarian cyst      Family History  Problem Relation Age of Onset  . Heart disease Mother   . Heart failure Mother   . Hypertension Father   . Breast cancer Paternal Aunt   . Diabetes Sister   . Hypertension Sister     Allergies  Allergen Reactions  . Bactrim [Sulfamethoxazole-Trimethoprim]     REACTION: leg cramps & rash    Current Outpatient Prescriptions on File Prior to Visit  Medication Sig Dispense Refill  . cyclobenzaprine (FLEXERIL) 5 MG tablet Take 1 tablet (5 mg total) by mouth 3 (three) times daily as needed for muscle spasms. 30 tablet 1  . lisinopril (PRINIVIL,ZESTRIL) 10 MG tablet Take 1 tablet (10 mg total) by mouth daily. 90 tablet 3  . Multiple Vitamin (MULTIVITAMIN) tablet Take 1 tablet by mouth daily.       No current facility-administered medications on file prior to visit.    BP 160/100 mmHg  Pulse 76  Temp(Src) 98.2 F (36.8 C) (Oral)  Resp 20  Ht 5\' 8"  (1.727 m)  Wt 168 lb 8 oz (76.431 kg)  BMI 25.63 kg/m2  SpO2  99%       Objective:   Physical Exam  Constitutional: She is oriented to person, place, and time. She appears well-developed and well-nourished. She appears distressed.  She is using a heating pad on her right lower back  Cardiovascular: Normal rate and regular rhythm.   Pulmonary/Chest: Effort normal and breath sounds normal. She has no wheezes. She has no rales.  Abdominal: Soft. Normal appearance and bowel sounds are normal. There is no tenderness. There is CVA tenderness.  No suprapubic tenderness  Musculoskeletal:       Back:  Negative SLR  Neurological: She is alert and oriented to person, place, and time.  Skin: Skin is warm and dry. No rash noted.  Psychiatric: She has a normal mood and affect. Her behavior is  normal. Judgment and thought content normal.        Assessment & Plan:  1. Right-sided low back pain without sciatica Exam and history present concern for nephrolithiasis due to patient visibly restless and description of pain noted. She is accompanied by her husband and they have agreed to go to the ED for further evaluation and pain management.  Since she will be evaluated at the ED, labs will be deferred for evaluation at this time.  2. Urinary frequency  Delano Metz, FNP-C

## 2015-12-20 NOTE — ED Notes (Signed)
Pt refused wheelchair, pt ambulated to room from waiting room.

## 2016-01-03 ENCOUNTER — Ambulatory Visit (INDEPENDENT_AMBULATORY_CARE_PROVIDER_SITE_OTHER): Payer: BC Managed Care – PPO | Admitting: Family Medicine

## 2016-01-03 VITALS — BP 120/90 | HR 76 | Temp 97.7°F | Ht 67.0 in | Wt 167.0 lb

## 2016-01-03 DIAGNOSIS — R350 Frequency of micturition: Secondary | ICD-10-CM

## 2016-01-03 DIAGNOSIS — J309 Allergic rhinitis, unspecified: Secondary | ICD-10-CM

## 2016-01-03 LAB — POCT URINALYSIS DIPSTICK
Bilirubin, UA: NEGATIVE
GLUCOSE UA: NEGATIVE
Ketones, UA: NEGATIVE
LEUKOCYTES UA: NEGATIVE
NITRITE UA: NEGATIVE
PROTEIN UA: NEGATIVE
SPEC GRAV UA: 1.01
Urobilinogen, UA: 0.2
pH, UA: 7

## 2016-01-03 NOTE — Progress Notes (Signed)
Subjective:    Patient ID: Christine Livingston, female    DOB: 04/10/1951, 65 y.o.   MRN: [id number]  HPI  Christine Livingston is a 65 year old female who presents today for an ED follow up due to flank pain and urinary symptoms. She also presents today with symptoms of sore throat for one day that has resolved, minor nasal congestion, post nasal drip and headache for 2 days which improved with advil. Associated symptoms of watery eyes is noted.   She denies fever, chills, sweats, ear pain, tooth pain, cough, or recent sick contact exposure.  No aggravating or alleviating factors noted.  Urinary frequency and urgency is noted today and is still present after being seen in the ED on 12/20/2015 for flank pain. She has completed keflex 4 days ago which was prescribed by ED provider. Review of the CT imaging from this ED visit and documentation provided  noted no evidence of renal or ureteral calculus or hydronephrosis. Treatment with Keflex initiated at that time due to possible UTI. Today she  is concerned that  an infection is still present.  She denies urinary burning, flank pain, or blood in her urine.  She reports drinking a "lot" of water according to patient however cannot quantify amount with the exception of stating that she "keeps a glass of water with her at all times".  She also denies chest pain, abdominal pain or SOB.    Review of Systems  Constitutional: Negative for fever and chills.  HENT: Positive for congestion and postnasal drip. Negative for ear discharge, rhinorrhea, sneezing and sore throat.   Eyes: Negative for redness, itching and visual disturbance.  Respiratory: Negative for cough and shortness of breath.   Cardiovascular: Negative for chest pain and palpitations.  Gastrointestinal: Negative for nausea, vomiting, abdominal pain, diarrhea and constipation.  Genitourinary: Positive for urgency and frequency. Negative for dysuria.  Musculoskeletal: Negative for myalgias and arthralgias.    Skin: Negative for rash.  Neurological: Negative for dizziness, light-headedness and headaches.  Psychiatric/Behavioral:       Denies depressed or anxious mood   Past Medical History  Diagnosis Date  . Hypertension   . WPW (Wolff-Parkinson-White syndrome)      Social History   Social History  . Marital Status: Married    Spouse Name: N/A  . Number of Children: N/A  . Years of Education: N/A   Occupational History  . Not on file.   Social History Main Topics  . Smoking status: Former Smoker    Quit date: 10/28/1980  . Smokeless tobacco: Never Used  . Alcohol Use: 0.0 oz/week    0 Standard drinks or equivalent per week     Comment: occ  . Drug Use: Not on file  . Sexual Activity: Not on file   Other Topics Concern  . Not on file   Social History Narrative    Past Surgical History  Procedure Laterality Date  . Laparoscopic drainage of ovarian cyst      Family History  Problem Relation Age of Onset  . Heart disease Mother   . Heart failure Mother   . Hypertension Father   . Breast cancer Paternal Aunt   . Diabetes Sister   . Hypertension Sister     Allergies  Allergen Reactions  . Bactrim [Sulfamethoxazole-Trimethoprim]     REACTION: leg cramps & rash    Current Outpatient Prescriptions on File Prior to Visit  Medication Sig Dispense Refill  . cyclobenzaprine (FLEXERIL) 5 MG tablet  Take 1 tablet (5 mg total) by mouth 3 (three) times daily as needed for muscle spasms. 30 tablet 1  . lisinopril (PRINIVIL,ZESTRIL) 10 MG tablet Take 1 tablet (10 mg total) by mouth daily. 90 tablet 3  . Multiple Vitamin (MULTIVITAMIN) tablet Take 1 tablet by mouth daily.      . Probiotic Product (PRO-BIOTIC BLEND PO) Take 1 capsule by mouth daily.     No current facility-administered medications on file prior to visit.    BP 120/90 mmHg  Pulse 76  Temp(Src) 97.7 F (36.5 C) (Oral)  Ht 5\' 7"  (1.702 m)  Wt 167 lb (75.751 kg)  BMI 26.15 kg/m2  SpO2 99%        Objective:   Physical Exam  Constitutional: She is oriented to person, place, and time. She appears well-developed and well-nourished.  HENT:  Right Ear: Tympanic membrane normal.  Left Ear: Tympanic membrane normal.  Nose: Rhinorrhea present. Right sinus exhibits no maxillary sinus tenderness and no frontal sinus tenderness. Left sinus exhibits no maxillary sinus tenderness and no frontal sinus tenderness.  Mouth/Throat: Mucous membranes are normal. No oropharyngeal exudate or posterior oropharyngeal erythema.  Erythematous nasal membranes noted  Eyes: Pupils are equal, round, and reactive to light. No scleral icterus.  Neck: Neck supple.  Cardiovascular: Normal rate and regular rhythm.   Pulmonary/Chest: Effort normal and breath sounds normal. She has no wheezes.  Abdominal: Soft. Bowel sounds are normal. There is no tenderness.  Musculoskeletal:  No CVA tenderness or flank pain present  Lymphadenopathy:    She has no cervical adenopathy.  Neurological: She is alert and oriented to person, place, and time.  Skin: Skin is warm and dry. No rash noted.  Psychiatric: She has a normal mood and affect. Her behavior is normal. Judgment and thought content normal.       Assessment & Plan:  1. Urinary frequency UA is negative for leukocytes however a large amount of leukocytes was present 2 weeks ago. A trace of microscopic blood is present in the UA today. Patient is highly concerned about a potential UTI. We discussed the results of her UA and also the signs and symptoms to observe for follow up such as urinary burning, blood in urine, flank pain, fever, chills, or sweats. Exam and history support a resolving UTI that was treated with Keflex. Urine culture will be completed today due to continuation of symptoms and patient concern. Further advised patient regarding indications for antibiotics. Advised patient to monitor water intake and avoid caffeine intake which will stimulate urinary  frequency.  Advised her to follow up if symptoms do not improve in 3-4 days, worsen, or she develops a fever >100.  Urine culture results will be called to patient. She voiced understanding and agreed with plan. - POCT urinalysis dipstick  2. Allergic rhinitis, unspecified allergic rhinitis type Allegra, Claritin, or Zyrtec can be used with flonase for symptoms of allergic rhinitis. Advised patient to follow up iIf symptoms do not improve in 3-4 days, worsen, or she develops sinus pressure/pain with a fever >100.  She voiced understanding and agreed with plan.   Delano Metz, FNP-C

## 2016-01-03 NOTE — Patient Instructions (Signed)
Allegra, Claritin, or Zyrtec can be used with flonase for symptoms of allergic rhinitis. If symptoms do not improve in 3-4 days, worsen, or you develop a fever >100, please follow up for further evaluation.   We have ordered labs or studies at this visit. It can take up to 1-2 weeks for results and processing. IF results require follow up or explanation, we will call you with instructions. Clinically stable results will be released to your [facility]. If you have not heard from [place] or cannot find your results in [facility] in 2 weeks please contact our office at [phone].  If you are not yet signed up for [facility], please consider signing up   Please keep upcoming appointment with your provider as scheduled.

## 2016-01-03 NOTE — Progress Notes (Signed)
Pre visit review using our clinic review tool, if applicable. No additional management support is needed unless otherwise documented below in the visit note. 

## 2016-01-04 LAB — URINALYSIS, MICROSCOPIC ONLY: WBC UA: NONE SEEN — AB (ref 0–?)

## 2016-01-05 LAB — URINE CULTURE: Colony Count: 50000

## 2016-03-03 ENCOUNTER — Other Ambulatory Visit: Payer: Self-pay | Admitting: Internal Medicine

## 2016-03-05 ENCOUNTER — Other Ambulatory Visit: Payer: Self-pay | Admitting: Internal Medicine

## 2016-03-06 ENCOUNTER — Other Ambulatory Visit: Payer: Self-pay | Admitting: Internal Medicine

## 2016-03-06 NOTE — Telephone Encounter (Signed)
Refill

## 2016-03-15 ENCOUNTER — Ambulatory Visit (INDEPENDENT_AMBULATORY_CARE_PROVIDER_SITE_OTHER): Payer: Medicare Other | Admitting: Family Medicine

## 2016-03-15 ENCOUNTER — Encounter: Payer: Self-pay | Admitting: Family Medicine

## 2016-03-15 VITALS — BP 132/72 | HR 81 | Temp 98.3°F | Resp 17 | Ht 68.5 in | Wt 167.0 lb

## 2016-03-15 DIAGNOSIS — R350 Frequency of micturition: Secondary | ICD-10-CM

## 2016-03-15 DIAGNOSIS — N3001 Acute cystitis with hematuria: Secondary | ICD-10-CM | POA: Diagnosis not present

## 2016-03-15 DIAGNOSIS — R300 Dysuria: Secondary | ICD-10-CM

## 2016-03-15 DIAGNOSIS — R011 Cardiac murmur, unspecified: Secondary | ICD-10-CM | POA: Diagnosis not present

## 2016-03-15 LAB — POCT URINALYSIS DIP (MANUAL ENTRY)
BILIRUBIN UA: NEGATIVE
Bilirubin, UA: NEGATIVE
Glucose, UA: NEGATIVE
Nitrite, UA: NEGATIVE
PH UA: 5.5
SPEC GRAV UA: 1.01
UROBILINOGEN UA: 0.2

## 2016-03-15 LAB — POC MICROSCOPIC URINALYSIS (UMFC): MUCUS RE: ABSENT

## 2016-03-15 MED ORDER — CEPHALEXIN 500 MG PO CAPS: 500.0000 mg | ORAL_CAPSULE | Freq: Two times a day (BID) | ORAL | 0 refills | Status: DC

## 2016-03-15 NOTE — Patient Instructions (Addendum)
Thank you for coming in today. Take keflex twice daily for UTI.  You should hear from the ultrasound people about the ECHO soon.  Let me know if you get worse.  If your belly pain worsens, or you have high fever, bad vomiting, blood in your stool or black tarry stool go to the Emergency Room.  Call or go to the emergency room if you get worse, have trouble breathing, have chest pains, or palpitations.    Urinary Tract Infection Urinary tract infections (UTIs) can develop anywhere along your urinary tract. Your urinary tract is your body's drainage system for removing wastes and extra water. Your urinary tract includes two kidneys, two ureters, a bladder, and a urethra. Your kidneys are a pair of bean-shaped organs. Each kidney is about the size of your fist. They are located below your ribs, one on each side of your spine. CAUSES Infections are caused by microbes, which are microscopic organisms, including fungi, viruses, and bacteria. These organisms are so small that they can only be seen through a microscope. Bacteria are the microbes that most commonly cause UTIs. SYMPTOMS  Symptoms of UTIs may vary by age and gender of the patient and by the location of the infection. Symptoms in young women typically include a frequent and intense urge to urinate and a painful, burning feeling in the bladder or urethra during urination. Older women and men are more likely to be tired, shaky, and weak and have muscle aches and abdominal pain. A fever may mean the infection is in your kidneys. Other symptoms of a kidney infection include pain in your back or sides below the ribs, nausea, and vomiting. DIAGNOSIS To diagnose a UTI, your caregiver will ask you about your symptoms. Your caregiver will also ask you to provide a urine sample. The urine sample will be tested for bacteria and white blood cells. White blood cells are made by your body to help fight infection. TREATMENT  Typically, UTIs can be treated  with medication. Because most UTIs are caused by a bacterial infection, they usually can be treated with the use of antibiotics. The choice of antibiotic and length of treatment depend on your symptoms and the type of bacteria causing your infection. HOME CARE INSTRUCTIONS  If you were prescribed antibiotics, take them exactly as your caregiver instructs you. Finish the medication even if you feel better after you have only taken some of the medication.  Drink enough water and fluids to keep your urine clear or pale yellow.  Avoid caffeine, tea, and carbonated beverages. They tend to irritate your bladder.  Empty your bladder often. Avoid holding urine for long periods of time.  Empty your bladder before and after sexual intercourse.  After a bowel movement, women should cleanse from front to back. Use each tissue only once. SEEK MEDICAL CARE IF:   You have back pain.  You develop a fever.  Your symptoms do not begin to resolve within 3 days. SEEK IMMEDIATE MEDICAL CARE IF:   You have severe back pain or lower abdominal pain.  You develop chills.  You have nausea or vomiting.  You have continued burning or discomfort with urination. MAKE SURE YOU:   Understand these instructions.  Will watch your condition.  Will get help right away if you are not doing well or get worse.   This information is not intended to replace advice given to you by your health care provider. Make sure you discuss any questions you have with your health  care provider.   Document Released: 04/30/2005 Document Revised: 04/11/2015 Document Reviewed: 08/29/2011 Elsevier Interactive Patient Education 2016 [place] A heart murmur is an extra sound heard by your health care provider when listening to your heart with a device called a stethoscope. The sound comes from turbulence when blood flows through the heart and may be a "hum" or "whoosh" sound heard when the heart beats. There  are two types of heart murmurs:  Innocent murmurs. Most people with this type of heart murmur do not have a heart problem. Many children have innocent heart murmurs. Your health care provider may suggest some basic testing to know whether your murmur is an innocent murmur. If an innocent heart murmur is found, there is no need for further tests or treatment and no need to restrict activities or stop playing sports.  Abnormal murmurs. These types of murmurs can occur in children and adults. In children, abnormal heart murmurs are typically caused from heart defects that are present at birth (congenital). In adults, abnormal murmurs are usually from heart valve problems caused by disease, infection, or aging. CAUSES  Normally, these valves open to let blood flow through or out of your heart and then shut to keep it from flowing backward. If they do not work properly, you could have:  Regurgitation--When blood leaks back through the valve in the wrong direction.  Mitral valve prolapse--When the mitral valve of the heart has a loose flap and does not close tightly.  Stenosis--When the valve does not open enough and blocks blood flow. SIGNS AND SYMPTOMS  Innocent murmurs do not cause symptoms, and many people with abnormal murmurs may or may not have symptoms. If symptoms do develop, they may include:  Shortness of breath.  Blue coloring of the skin, especially on the fingertips.  Chest pain.  Palpitations, or feeling a fluttering or skipped heartbeat.  Fainting.  Persistent cough.  Getting tired much faster than expected. DIAGNOSIS  A heart murmur might be heard during a sports physical or during any type of examination. When a murmur is heard, it may suggest a possible problem. When this happens, your health care provider may ask you to see a heart specialist (cardiologist). You may also be asked to have one or more heart tests. In these cases, testing may vary depending on what your  health care provider heard. Tests for a heart murmur may include:  Electrocardiogram.  Echocardiogram.  MRI. For children and adults who have an abnormal heart murmur and want to play sports, it is important to complete testing, review test results, and receive recommendations from your health care provider. If heart disease is present, it may not be safe to play. TREATMENT  Innocent murmurs require no treatment or activity restriction. If an abnormal murmur represents a problem with the heart, treatment will depend on the exact nature of the problem. In these cases, medicine or surgery may be needed to treat the problem. HOME CARE INSTRUCTIONS If you want to participate in sports or other types of strenuous physical activity, it is important to discuss this first with your health care provider. If the murmur represents a problem with the heart and you choose to participate in sports, there is a small chance that a serious problem (including sudden death) could result.  SEEK MEDICAL CARE IF:   You feel that your symptoms are slowly worsening.  You develop any new symptoms that cause concern.  You feel that you are having side  effects from any medicines prescribed. SEEK IMMEDIATE MEDICAL CARE IF:   You develop chest pain.  You have shortness of breath.  You notice that your heart beats irregularly often enough to cause you to worry.  You have fainting spells.  Your symptoms suddenly get worse.   This information is not intended to replace advice given to you by your health care provider. Make sure you discuss any questions you have with your health care provider.   Document Released: 08/28/2004 Document Revised: 08/11/2014 Document Reviewed: 03/28/2013 Elsevier Interactive Patient Education 2016 [employer].   IF you received an x-ray today, you will receive an invoice from [facility]. Please contact [facility] at [phone] with questions or concerns  regarding your invoice.   IF you received labwork today, you will receive an invoice from [employer]. Please contact [facility] at [phone] with questions or concerns regarding your invoice.   Our billing staff will not be able to assist you with questions regarding bills from these companies.  You will be contacted with the lab results as soon as they are available. The fastest way to get your results is to activate your My Chart account. Instructions are located on the last page of this paperwork. If you have not heard from [place] regarding the results in 2 weeks, please contact this office.

## 2016-03-15 NOTE — Progress Notes (Signed)
Christine Livingston is a 65 y.o. female who presents to [facility] today for urinary tract infection. Patient has a one-day history of urinary frequency urgency dysuria. No fevers chills nausea vomiting or diarrhea. Symptoms are somewhat consistent with previous episodes of UTI. She has not tried any medications yet. She feels well otherwise. She notes that she denies a personal history of heart murmurs but does have a history of Wolff-Parkinson-White syndrome.   Past Medical History:  Diagnosis Date  . Hypertension   . WPW (Wolff-Parkinson-White syndrome)    Past Surgical History:  Procedure Laterality Date  . laparoscopic drainage of ovarian cyst     Social History  Substance Use Topics  . Smoking status: Former Smoker    Quit date: 10/28/1980  . Smokeless tobacco: Never Used  . Alcohol use 0.0 oz/week     Comment: occ   ROS as above Medications: Current Outpatient Prescriptions  Medication Sig Dispense Refill  . lisinopril (PRINIVIL,ZESTRIL) 10 MG tablet TAKE ONE TABLET BY MOUTH ONCE DAILY 90 tablet 0  . lisinopril (PRINIVIL,ZESTRIL) 10 MG tablet TAKE ONE TABLET BY MOUTH ONCE DAILY 90 tablet 0  . lisinopril (PRINIVIL,ZESTRIL) 10 MG tablet TAKE ONE TABLET BY MOUTH ONCE DAILY 90 tablet 0  . Multiple Vitamin (MULTIVITAMIN) tablet Take 1 tablet by mouth daily.      . Probiotic Product (PRO-BIOTIC BLEND PO) Take 1 capsule by mouth daily.    . cephALEXin (KEFLEX) 500 MG capsule Take 1 capsule (500 mg total) by mouth 2 (two) times daily. 14 capsule 0  . cyclobenzaprine (FLEXERIL) 5 MG tablet Take 1 tablet (5 mg total) by mouth 3 (three) times daily as needed for muscle spasms. (Patient not taking: Reported on 03/15/2016) 30 tablet 1   No current facility-administered medications for this visit.    Allergies  Allergen Reactions  . Bactrim [Sulfamethoxazole-Trimethoprim]     REACTION: leg cramps & rash     Exam:  BP 132/72 (BP Location: Right Arm, Patient Position: Sitting, Cuff Size:  Normal)   Pulse 81   Temp 98.3 F (36.8 C) (Oral)   Resp 17   Ht 5' 8.5" (1.74 m)   Wt 167 lb (75.8 kg)   SpO2 96%   BMI 25.02 kg/m  Gen: Well NAD HEENT: EOMI,  MMM Lungs: Normal work of breathing. CTABL Heart: RRR no Soft systolic murmur at the right upper sternal border radiating to the left chest not to the neck. Murmur is 2/6. Pulses are equal extremity. Abd: NABS, Soft. Nondistended, Nontender Exts: Brisk capillary refill, warm and well perfused.   Results for orders placed or performed in visit on 03/15/16 (from the past 24 hour(s))  POCT urinalysis dipstick     Status: Abnormal   Collection Time: 03/15/16  8:42 AM  Result Value Ref Range   Color, UA yellow yellow   Clarity, UA cloudy (A) clear   Glucose, UA negative negative   Bilirubin, UA negative negative   Ketones, POC UA negative negative   Spec Grav, UA 1.010    Blood, UA large (A) negative   pH, UA 5.5    Protein Ur, POC =30 (A) negative   Urobilinogen, UA 0.2    Nitrite, UA Negative Negative   Leukocytes, UA large (3+) (A) Negative  POCT Microscopic Urinalysis ([facility])     Status: Abnormal   Collection Time: 03/15/16  8:43 AM  Result Value Ref Range   WBC,UR,HPF,POC Too numerous to count  (A) None WBC/hpf   RBC,UR,HPF,POC Few (  A) None RBC/hpf   Bacteria Moderate (A) None, Too numerous to count   Mucus Absent Absent   Epithelial Cells, UR Per Microscopy None None, Too numerous to count cells/hpf   No results found.  Assessment and Plan: 65 y.o. female with  1) urinary tract infection. Culture pending. Empiric treatment with Keflex.  2) undiagnosed heart murmur: Patient denies any personal history of murmur and looking back in her notes it has not been documented previously. She has no echocardiogram that I can see. This appears to be a new murmur. Suspect aortic stenosis. Plan for echocardiogram and follow-up with PCP.  Discussed warning signs or symptoms. Please see discharge instructions. Patient  expresses understanding.

## 2016-03-17 ENCOUNTER — Other Ambulatory Visit (INDEPENDENT_AMBULATORY_CARE_PROVIDER_SITE_OTHER): Payer: Medicare Other

## 2016-03-17 DIAGNOSIS — Z0000 Encounter for general adult medical examination without abnormal findings: Secondary | ICD-10-CM | POA: Diagnosis not present

## 2016-03-17 LAB — CBC WITH DIFFERENTIAL/PLATELET
Basophils Absolute: 0 10*3/uL (ref 0.0–0.1)
Basophils Relative: 0.7 % (ref 0.0–3.0)
EOS PCT: 1.6 % (ref 0.0–5.0)
Eosinophils Absolute: 0.1 10*3/uL (ref 0.0–0.7)
HEMATOCRIT: 43.3 % (ref 36.0–46.0)
Hemoglobin: 14.5 g/dL (ref 12.0–15.0)
LYMPHS ABS: 1.7 10*3/uL (ref 0.7–4.0)
Lymphocytes Relative: 41 % (ref 12.0–46.0)
MCHC: 33.4 g/dL (ref 30.0–36.0)
MCV: 85.6 fl (ref 78.0–100.0)
MONOS PCT: 7 % (ref 3.0–12.0)
Monocytes Absolute: 0.3 10*3/uL (ref 0.1–1.0)
NEUTROS ABS: 2.1 10*3/uL (ref 1.4–7.7)
NEUTROS PCT: 49.7 % (ref 43.0–77.0)
PLATELETS: 230 10*3/uL (ref 150.0–400.0)
RBC: 5.06 Mil/uL (ref 3.87–5.11)
RDW: 13.5 % (ref 11.5–15.5)
WBC: 4.3 10*3/uL (ref 4.0–10.5)

## 2016-03-17 LAB — LIPID PANEL
CHOLESTEROL: 249 mg/dL — AB (ref 0–200)
HDL: 54.2 mg/dL (ref >39.00)
LDL Cholesterol: 169 mg/dL — ABNORMAL HIGH (ref 0–99)
NonHDL: 194.54
Total CHOL/HDL Ratio: 5
Triglycerides: 129 mg/dL (ref 0.0–149.0)
VLDL: 25.8 mg/dL (ref 0.0–40.0)

## 2016-03-17 LAB — POC URINALSYSI DIPSTICK (AUTOMATED)
Bilirubin, UA: NEGATIVE
GLUCOSE UA: NEGATIVE
Ketones, UA: NEGATIVE
Leukocytes, UA: NEGATIVE
NITRITE UA: NEGATIVE
Protein, UA: NEGATIVE
Spec Grav, UA: 1.015
UROBILINOGEN UA: 0.2
pH, UA: 7

## 2016-03-17 LAB — HEPATIC FUNCTION PANEL
ALBUMIN: 4.5 g/dL (ref 3.5–5.2)
ALT: 25 U/L (ref 0–35)
AST: 20 U/L (ref 0–37)
Alkaline Phosphatase: 84 U/L (ref 39–117)
Bilirubin, Direct: 0.1 mg/dL (ref 0.0–0.3)
TOTAL PROTEIN: 6.9 g/dL (ref 6.0–8.3)
Total Bilirubin: 0.8 mg/dL (ref 0.2–1.2)

## 2016-03-17 LAB — URINE CULTURE

## 2016-03-17 LAB — BASIC METABOLIC PANEL
BUN: 12 mg/dL (ref 6–23)
CHLORIDE: 102 meq/L (ref 96–112)
CO2: 30 meq/L (ref 19–32)
Calcium: 9.6 mg/dL (ref 8.4–10.5)
Creatinine, Ser: 0.73 mg/dL (ref 0.40–1.20)
GFR: 85.04 mL/min (ref >60.00)
GLUCOSE: 97 mg/dL (ref 70–99)
POTASSIUM: 4.1 meq/L (ref 3.5–5.1)
SODIUM: 140 meq/L (ref 135–145)

## 2016-03-17 LAB — TSH: TSH: 1.74 u[IU]/mL (ref 0.35–4.50)

## 2016-03-25 ENCOUNTER — Encounter: Payer: BC Managed Care – PPO | Admitting: Internal Medicine

## 2016-03-25 ENCOUNTER — Ambulatory Visit (INDEPENDENT_AMBULATORY_CARE_PROVIDER_SITE_OTHER): Payer: Medicare Other | Admitting: Family Medicine

## 2016-03-25 VITALS — BP 150/90 | HR 92 | Temp 98.6°F | Ht 68.5 in | Wt 167.0 lb

## 2016-03-25 DIAGNOSIS — R350 Frequency of micturition: Secondary | ICD-10-CM | POA: Diagnosis not present

## 2016-03-25 LAB — POCT URINALYSIS DIPSTICK
BILIRUBIN UA: NEGATIVE
Glucose, UA: NEGATIVE
KETONES UA: NEGATIVE
Nitrite, UA: NEGATIVE
PH UA: 7
Protein, UA: NEGATIVE
Spec Grav, UA: 1.015
Urobilinogen, UA: 0.2

## 2016-03-25 NOTE — Progress Notes (Signed)
Pre visit review using our clinic review tool, if applicable. No additional management support is needed unless otherwise documented below in the visit note. 

## 2016-03-25 NOTE — Patient Instructions (Signed)
Drink lots of fluids We will call you with urine culture results Follow up sooner for any fever or burning with urination.

## 2016-03-25 NOTE — Progress Notes (Signed)
Subjective:     Patient ID: Christine Livingston, female   DOB: Aug 07, 1950, 65 y.o.   MRN: [id number]  HPI Patient seen with recurrent urine frequency. No burning with urination. No fevers or chills. Recent history is that she went to urgent care about 10 days ago and had UTI with positive culture for Proteus. She was treated with Keflex and symptoms did improve. On day 5 of antibiotic she developed some mild diarrhea and she stopped taking this after 6 days and diarrhea symptoms have resolved. She now has some urine frequency and urgency and some vague low back pains. No burning with urination. No gross hematuria. No cloudy urine.  Past Medical History:  Diagnosis Date  . Hypertension   . WPW (Wolff-Parkinson-White syndrome)    Past Surgical History:  Procedure Laterality Date  . laparoscopic drainage of ovarian cyst      reports that she quit smoking about 35 years ago. She has never used smokeless tobacco. She reports that she drinks alcohol. Her drug history is not on file. family history includes Breast cancer in her paternal aunt; Diabetes in her sister; Heart disease in her mother; Heart failure in her mother; Hypertension in her father and sister. Allergies  Allergen Reactions  . Bactrim [Sulfamethoxazole-Trimethoprim]     REACTION: leg cramps & rash  . Cephalexin Diarrhea     Review of Systems  Constitutional: Negative for chills and fever.  Genitourinary: Positive for frequency and urgency. Negative for difficulty urinating and hematuria.       Objective:   Physical Exam  Constitutional: She appears well-developed and well-nourished. No distress.  Cardiovascular: Normal rate and regular rhythm.   Pulmonary/Chest: Effort normal and breath sounds normal. No respiratory distress. She has no wheezes. She has no rales.       Assessment:     Urinary frequency and urgency in a patient with recent UTI. She currently does not have any burning with urination or fever. Urine dipstick  reveals moderate leukocytes and trace blood otherwise negative    Plan:     -Urine culture sent -Stay well-hydrated. -We elected not to start antibiotics at this point unless her culture comes back positive- unless she develops any fever or progressive urinary symptoms in the meantime  Eulas Post MD [facility]

## 2016-03-28 LAB — URINE CULTURE

## 2016-03-31 ENCOUNTER — Other Ambulatory Visit: Payer: Self-pay

## 2016-03-31 ENCOUNTER — Ambulatory Visit (HOSPITAL_COMMUNITY): Payer: Medicare Other | Attending: Family Medicine

## 2016-03-31 DIAGNOSIS — I358 Other nonrheumatic aortic valve disorders: Secondary | ICD-10-CM | POA: Diagnosis not present

## 2016-03-31 DIAGNOSIS — Z87891 Personal history of nicotine dependence: Secondary | ICD-10-CM | POA: Insufficient documentation

## 2016-03-31 DIAGNOSIS — R011 Cardiac murmur, unspecified: Secondary | ICD-10-CM | POA: Diagnosis not present

## 2016-03-31 DIAGNOSIS — I10 Essential (primary) hypertension: Secondary | ICD-10-CM | POA: Insufficient documentation

## 2016-03-31 DIAGNOSIS — I071 Rheumatic tricuspid insufficiency: Secondary | ICD-10-CM | POA: Diagnosis not present

## 2016-03-31 LAB — ECHOCARDIOGRAM COMPLETE
AVLVOTPG: 6 mmHg
Ao-asc: 35 cm
CHL CUP MV DEC (S): 264
CHL CUP STROKE VOLUME: 38 mL
CHL CUP TV REG PEAK VELOCITY: 265 cm/s
E decel time: 264 msec
E/e' ratio: 9.4
FS: 30 % (ref 28–44)
IV/PV OW: 1.38
LA diam index: 1.53 cm/m2
LASIZE: 29 mm
LAVOL: 47 mL
LAVOLA4C: 44 mL
LAVOLIN: 24.7 mL/m2
LDCA: 2.27 cm2
LEFT ATRIUM END SYS DIAM: 29 mm
LV E/e'average: 9.4
LV PW d: 8.86 mm — AB (ref 0.6–1.1)
LV dias vol: 53 mL (ref 46–106)
LV e' LATERAL: 8.87 cm/s
LV sys vol index: 8 mL/m2
LV sys vol: 15 mL (ref 14–42)
LVDIAVOLIN: 28 mL/m2
LVEEMED: 9.4
LVOT SV: 55 mL
LVOT VTI: 24.4 cm
LVOT diameter: 17 mm
LVOT peak vel: 123 cm/s
Lateral S' vel: 10.1 cm/s
MV pk E vel: 83.4 m/s
MVPG: 3 mmHg
MVPKAVEL: 95.3 m/s
RV sys press: 31 mmHg
Simpson's disk: 72
TDI e' lateral: 8.87
TDI e' medial: 5.46
TRMAXVEL: 265 cm/s

## 2016-04-02 ENCOUNTER — Encounter: Payer: Self-pay | Admitting: Internal Medicine

## 2016-04-02 ENCOUNTER — Ambulatory Visit (INDEPENDENT_AMBULATORY_CARE_PROVIDER_SITE_OTHER): Payer: Medicare Other | Admitting: Internal Medicine

## 2016-04-02 VITALS — BP 120/82 | HR 90 | Temp 98.0°F | Resp 20 | Ht 66.5 in | Wt 166.2 lb

## 2016-04-02 DIAGNOSIS — Z860100 Personal history of colon polyps, unspecified: Secondary | ICD-10-CM

## 2016-04-02 DIAGNOSIS — Z0000 Encounter for general adult medical examination without abnormal findings: Secondary | ICD-10-CM

## 2016-04-02 DIAGNOSIS — I10 Essential (primary) hypertension: Secondary | ICD-10-CM | POA: Diagnosis not present

## 2016-04-02 NOTE — Progress Notes (Signed)
Pre visit review using our clinic review tool, if applicable. No additional management support is needed unless otherwise documented below in the visit note. 

## 2016-04-02 NOTE — Progress Notes (Signed)
Subjective:    Patient ID: Christine Livingston, female    DOB: July 19, 1951, 65 y.o.   MRN: [id number]  HPI  65 year old patient who is seen today for a preventive health examination. She is followed by gynecology and has had recent mammograms. She has had a difficult year with recurrent urinary tract infections.  She is also evaluated for possible renal colic and had a negative abdominal CT scan.  Doing well today except for some left lumbar pain with movement She has a history of colonic polyps and is up-to-date on her colonoscopy She was seen at the urgent care recently for UTI.  A 2-D echocardiogram was ordered due to a heart murmur that was unremarkable She has treated hypertension which has been well-controlled on lisinopril  Past Medical History:  Diagnosis Date  . Hypertension   . WPW (Wolff-Parkinson-White syndrome)      Social History   Social History  . Marital status: Married    Spouse name: N/A  . Number of children: N/A  . Years of education: N/A   Occupational History  . Not on file.   Social History Main Topics  . Smoking status: Former Smoker    Quit date: 10/28/1980  . Smokeless tobacco: Never Used  . Alcohol use 0.0 oz/week     Comment: occ  . Drug use: Unknown  . Sexual activity: Not on file   Other Topics Concern  . Not on file   Social History Narrative  . No narrative on file    Past Surgical History:  Procedure Laterality Date  . laparoscopic drainage of ovarian cyst      Family History  Problem Relation Age of Onset  . Heart disease Mother   . Heart failure Mother   . Hypertension Father   . Breast cancer Paternal Aunt   . Diabetes Sister   . Hypertension Sister     Allergies  Allergen Reactions  . Bactrim [Sulfamethoxazole-Trimethoprim]     REACTION: leg cramps & rash  . Cephalexin Diarrhea    Current Outpatient Prescriptions on File Prior to Visit  Medication Sig Dispense Refill  . lisinopril (PRINIVIL,ZESTRIL) 10 MG tablet TAKE  ONE TABLET BY MOUTH ONCE DAILY 90 tablet 0  . Multiple Vitamin (MULTIVITAMIN) tablet Take 1 tablet by mouth daily.      . Probiotic Product (PRO-BIOTIC BLEND PO) Take 1 capsule by mouth daily.     No current facility-administered medications on file prior to visit.     BP 120/82 (BP Location: Left Arm, Patient Position: Sitting, Cuff Size: Normal)   Pulse 90   Temp 98 F (36.7 C) (Oral)   Resp 20   Ht 5' 6.5" (1.689 m)   Wt 166 lb 4 oz (75.4 kg)   SpO2 98%   BMI 26.43 kg/m   Medicare wellness exam  1. Risk factors, based on past  M,S,F history.  Current vascular risk factors include hypertension.  She has mild dyslipidemia, controlled with diet  2.  Physical activities: No restrictions.  [facility]  3.  Depression/mood: No history of major depression or mood disorder  4.  Hearing: Note no deficits  5.  ADL's: Independent  6.  Fall risk: Low  7.  Home safety: No problems identified  8.  Height weight, and visual acuity; height and weight stable no change in visual acuity  9.  Counseling: Continue heart healthy diet regular exercise  10. Lab orders based on risk factors:  Laboratory profile including lipid profile reviewed and discussed  11. Referral : Not appropriate at this time.  Does see GYN and GI on a scheduled basis  12. Care plan: Continue efforts at aggressive risk factor modification  13. Cognitive assessment: Alert in order with normal affect no cognitive dysfunction 14. Screening: Patient provided with a written and personalized 5-10 year screening schedule in the AVS.    15. Provider List Update: Includes primary care OB/GYN and GI as well as ophthalmology   Review of Systems  Constitutional: Negative for appetite change, fatigue, fever and unexpected weight change.  HENT: Negative for congestion, dental problem, ear pain, hearing loss, mouth sores, nosebleeds, sinus pressure, sore throat, tinnitus, trouble swallowing and voice  change.   Eyes: Negative for photophobia, pain, redness and visual disturbance.  Respiratory: Negative for cough, chest tightness and shortness of breath.   Cardiovascular: Negative for chest pain, palpitations and leg swelling.  Gastrointestinal: Negative for abdominal distention, abdominal pain, blood in stool, constipation, diarrhea, nausea, rectal pain and vomiting.  Genitourinary: Negative for difficulty urinating, dysuria, flank pain, frequency, genital sores, hematuria, menstrual problem, pelvic pain, urgency, vaginal bleeding, vaginal discharge and vaginal pain.  Musculoskeletal: Positive for back pain. Negative for arthralgias and neck stiffness.  Skin: Negative for rash.  Neurological: Negative for dizziness, syncope, speech difficulty, weakness, light-headedness, numbness and headaches.  Hematological: Negative for adenopathy. Does not bruise/bleed easily.  Psychiatric/Behavioral: Negative for agitation, behavioral problems, dysphoric mood, self-injury and suicidal ideas. The patient is not nervous/anxious.        Objective:   Physical Exam  Constitutional: She is oriented to person, place, and time. She appears well-developed and well-nourished.  HENT:  Head: Normocephalic and atraumatic.  Right Ear: External ear normal.  Left Ear: External ear normal.  Mouth/Throat: Oropharynx is clear and moist.  Eyes: Conjunctivae and EOM are normal.  Neck: Normal range of motion. Neck supple. No JVD present. No thyromegaly present.  Cardiovascular: Normal rate, regular rhythm, normal heart sounds and intact distal pulses.   No murmur heard. Grade 1/6 systolic murmur along the left lower sternal border  Pulmonary/Chest: Effort normal and breath sounds normal. She has no wheezes. She has no rales.  Abdominal: Soft. Bowel sounds are normal. She exhibits no distension and no mass. There is no tenderness. There is no rebound and no guarding.  Musculoskeletal: Normal range of motion. She  exhibits no edema or tenderness.  Neurological: She is alert and oriented to person, place, and time. She has normal reflexes. No cranial nerve deficit. She exhibits normal muscle tone. Coordination normal.  Skin: Skin is warm and dry. No rash noted.  Psychiatric: She has a normal mood and affect. Her behavior is normal.          Assessment & Plan:   Preventive health examination Essential hypertension, stable Recurrent UTIs.  Patient presently completing Cipro Intermittent low back pain History colonic polyps.  Will continue colonoscopies at five-year intervals History WPW  Continue home blood pressure monitoring Heart healthy diet GYN follow-up Annual mammograms  Recheck one year or as needed  Nyoka Cowden

## 2016-04-02 NOTE — Patient Instructions (Addendum)
Limit your sodium (Salt) intake  Please check your blood pressure on a regular basis.  If it is consistently greater than 150/90, please make an office appointment.    It is important that you exercise regularly, at least 20 minutes 3 to 4 times per week.  If you develop chest pain or shortness of breath seek  medical attention.  Menopause is a normal process in which your reproductive ability comes to an end. This process happens gradually over a span of months to years, usually between the ages of 30 and 32. Menopause is complete when you have missed 12 consecutive menstrual periods. It is important to talk with your health care provider about some of the most common conditions that affect postmenopausal women, such as heart disease, cancer, and bone loss (osteoporosis). Adopting a healthy lifestyle and getting preventive care can help to promote your health and wellness. Those actions can also lower your chances of developing some of these common conditions. WHAT SHOULD I KNOW ABOUT MENOPAUSE? During menopause, you may experience a number of symptoms, such as:  Moderate-to-severe hot flashes.  Night sweats.  Decrease in sex drive.  Mood swings.  Headaches.  Tiredness.  Irritability.  Memory problems.  Insomnia. Choosing to treat or not to treat menopausal changes is an individual decision that you make with your health care provider. WHAT SHOULD I KNOW ABOUT HORMONE REPLACEMENT THERAPY AND SUPPLEMENTS? Hormone therapy products are effective for treating symptoms that are associated with menopause, such as hot flashes and night sweats. Hormone replacement carries certain risks, especially as you become older. If you are thinking about using estrogen or estrogen with progestin treatments, discuss the benefits and risks with your health care provider. WHAT SHOULD I KNOW ABOUT HEART DISEASE AND STROKE? Heart disease, heart attack, and stroke become more likely as you age. This may be  due, in part, to the hormonal changes that your body experiences during menopause. These can affect how your body processes dietary fats, triglycerides, and cholesterol. Heart attack and stroke are both medical emergencies. There are many things that you can do to help prevent heart disease and stroke:  Have your blood pressure checked at least every 1-2 years. High blood pressure causes heart disease and increases the risk of stroke.  If you are 80-77 years old, ask your health care provider if you should take aspirin to prevent a heart attack or a stroke.  Do not use any tobacco products, including cigarettes, chewing tobacco, or electronic cigarettes. If you need help quitting, ask your health care provider.  It is important to eat a healthy diet and maintain a healthy weight.  Be sure to include plenty of vegetables, fruits, low-fat dairy products, and lean protein.  Avoid eating foods that are high in solid fats, added sugars, or salt (sodium).  Get regular exercise. This is one of the most important things that you can do for your health.  Try to exercise for at least 150 minutes each week. The type of exercise that you do should increase your heart rate and make you sweat. This is known as moderate-intensity exercise.  Try to do strengthening exercises at least twice each week. Do these in addition to the moderate-intensity exercise.  Know your numbers.Ask your health care provider to check your cholesterol and your blood glucose. Continue to have your blood tested as directed by your health care provider. WHAT SHOULD I KNOW ABOUT CANCER SCREENING? There are several types of cancer. Take the following steps to  reduce your risk and to catch any cancer development as early as possible. Breast Cancer  Practice breast self-awareness.  This means understanding how your breasts normally appear and feel.  It also means doing regular breast self-exams. Let your health care provider know  about any changes, no matter how small.  If you are 24 or older, have a clinician do a breast exam (clinical breast exam or CBE) every year. Depending on your age, family history, and medical history, it may be recommended that you also have a yearly breast X-ray (mammogram).  If you have a family history of breast cancer, talk with your health care provider about genetic screening.  If you are at high risk for breast cancer, talk with your health care provider about having an MRI and a mammogram every year.  Breast cancer (BRCA) gene test is recommended for women who have family members with BRCA-related cancers. Results of the assessment will determine the need for genetic counseling and BRCA1 and for BRCA2 testing. BRCA-related cancers include these types:  Breast. This occurs in males or females.  Ovarian.  Tubal. This may also be called fallopian tube cancer.  Cancer of the abdominal or pelvic lining (peritoneal cancer).  Prostate.  Pancreatic. Cervical, Uterine, and Ovarian Cancer Your health care provider may recommend that you be screened regularly for cancer of the pelvic organs. These include your ovaries, uterus, and vagina. This screening involves a pelvic exam, which includes checking for microscopic changes to the surface of your cervix (Pap test).  For women ages 21-65, health care providers may recommend a pelvic exam and a Pap test every three years. For women ages 76-65, they may recommend the Pap test and pelvic exam, combined with testing for human papilloma virus (HPV), every five years. Some types of HPV increase your risk of cervical cancer. Testing for HPV may also be done on women of any age who have unclear Pap test results.  Other health care providers may not recommend any screening for nonpregnant women who are considered low risk for pelvic cancer and have no symptoms. Ask your health care provider if a screening pelvic exam is right for you.  If you have had  past treatment for cervical cancer or a condition that could lead to cancer, you need Pap tests and screening for cancer for at least 20 years after your treatment. If Pap tests have been discontinued for you, your risk factors (such as having a new sexual partner) need to be reassessed to determine if you should start having screenings again. Some women have medical problems that increase the chance of getting cervical cancer. In these cases, your health care provider may recommend that you have screening and Pap tests more often.  If you have a family history of uterine cancer or ovarian cancer, talk with your health care provider about genetic screening.  If you have vaginal bleeding after reaching menopause, tell your health care provider.  There are currently no reliable tests available to screen for ovarian cancer. Lung Cancer Lung cancer screening is recommended for adults 34-60 years old who are at high risk for lung cancer because of a history of smoking. A yearly low-dose CT scan of the lungs is recommended if you:  Currently smoke.  Have a history of at least 30 pack-years of smoking and you currently smoke or have quit within the past 15 years. A pack-year is smoking an average of one pack of cigarettes per day for one year. Yearly screening  should:  Continue until it has been 15 years since you quit.  Stop if you develop a health problem that would prevent you from having lung cancer treatment. Colorectal Cancer  This type of cancer can be detected and can often be prevented.  Routine colorectal cancer screening usually begins at age 6 and continues through age 10.  If you have risk factors for colon cancer, your health care provider may recommend that you be screened at an earlier age.  If you have a family history of colorectal cancer, talk with your health care provider about genetic screening.  Your health care provider may also recommend using home test kits to check  for hidden blood in your stool.  A small camera at the end of a tube can be used to examine your colon directly (sigmoidoscopy or colonoscopy). This is done to check for the earliest forms of colorectal cancer.  Direct examination of the colon should be repeated every 5-10 years until age 2. However, if early forms of precancerous polyps or small growths are found or if you have a family history or genetic risk for colorectal cancer, you may need to be screened more often. Skin Cancer  Check your skin from head to toe regularly.  Monitor any moles. Be sure to tell your health care provider:  About any new moles or changes in moles, especially if there is a change in a mole's shape or color.  If you have a mole that is larger than the size of a pencil eraser.  If any of your family members has a history of skin cancer, especially at a young age, talk with your health care provider about genetic screening.  Always use sunscreen. Apply sunscreen liberally and repeatedly throughout the day.  Whenever you are outside, protect yourself by wearing long sleeves, pants, a wide-brimmed hat, and sunglasses. WHAT SHOULD I KNOW ABOUT OSTEOPOROSIS? Osteoporosis is a condition in which bone destruction happens more quickly than new bone creation. After menopause, you may be at an increased risk for osteoporosis. To help prevent osteoporosis or the bone fractures that can happen because of osteoporosis, the following is recommended:  If you are 64-51 years old, get at least 1,000 mg of calcium and at least 600 mg of vitamin D per day.  If you are older than age 25 but younger than age 67, get at least 1,200 mg of calcium and at least 600 mg of vitamin D per day.  If you are older than age 41, get at least 1,200 mg of calcium and at least 800 mg of vitamin D per day. Smoking and excessive alcohol intake increase the risk of osteoporosis. Eat foods that are rich in calcium and vitamin D, and do  weight-bearing exercises several times each week as directed by your health care provider. WHAT SHOULD I KNOW ABOUT HOW MENOPAUSE AFFECTS [place]? Depression may occur at any age, but it is more common as you become older. Common symptoms of depression include:  Low or sad mood.  Changes in sleep patterns.  Changes in appetite or eating patterns.  Feeling an overall lack of motivation or enjoyment of activities that you previously enjoyed.  Frequent crying spells. Talk with your health care provider if you think that you are experiencing depression. WHAT SHOULD I KNOW ABOUT IMMUNIZATIONS? It is important that you get and maintain your immunizations. These include:  Tetanus, diphtheria, and pertussis (Tdap) booster vaccine.  Influenza every year before the flu season begins.  Pneumonia vaccine.  Shingles vaccine. Your health care provider may also recommend other immunizations.   This information is not intended to replace advice given to you by your health care provider. Make sure you discuss any questions you have with your health care provider.   Document Released: 09/12/2005 Document Revised: 08/11/2014 Document Reviewed: 03/23/2014 Elsevier Interactive Patient Education [employer].

## 2016-09-30 LAB — HM MAMMOGRAPHY

## 2016-10-07 ENCOUNTER — Ambulatory Visit (INDEPENDENT_AMBULATORY_CARE_PROVIDER_SITE_OTHER): Payer: Medicare Other | Admitting: Internal Medicine

## 2016-10-07 ENCOUNTER — Encounter: Payer: Self-pay | Admitting: Internal Medicine

## 2016-10-07 VITALS — BP 138/72 | HR 84 | Temp 98.7°F | Ht 66.5 in | Wt 167.2 lb

## 2016-10-07 DIAGNOSIS — N390 Urinary tract infection, site not specified: Secondary | ICD-10-CM | POA: Diagnosis not present

## 2016-10-07 DIAGNOSIS — N3000 Acute cystitis without hematuria: Secondary | ICD-10-CM

## 2016-10-07 DIAGNOSIS — M545 Low back pain, unspecified: Secondary | ICD-10-CM

## 2016-10-07 DIAGNOSIS — I10 Essential (primary) hypertension: Secondary | ICD-10-CM

## 2016-10-07 LAB — POC URINALSYSI DIPSTICK (AUTOMATED)
BILIRUBIN UA: NEGATIVE
GLUCOSE UA: NEGATIVE
KETONES UA: NEGATIVE
Nitrite, UA: NEGATIVE
Protein, UA: NEGATIVE
RBC UA: NEGATIVE
Spec Grav, UA: 1.015
Urobilinogen, UA: 0.2
pH, UA: 7

## 2016-10-07 MED ORDER — CIPROFLOXACIN HCL 500 MG PO TABS: 500.0000 mg | ORAL_TABLET | Freq: Two times a day (BID) | ORAL | 0 refills | Status: DC

## 2016-10-07 NOTE — Patient Instructions (Signed)
Drink as much fluid as you  can tolerate over the next few days  Take your antibiotic as prescribed until ALL of it is gone, but stop if you develop a rash, swelling, or any side effects of the medication.  Contact our office as soon as possible if  there are side effects of the medication.   

## 2016-10-07 NOTE — Progress Notes (Signed)
Pre visit review using our clinic review tool, if applicable. No additional management support is needed unless otherwise documented below in the visit note. 

## 2016-10-07 NOTE — Progress Notes (Signed)
Subjective:    Patient ID: Christine Livingston, female    DOB: [birth year], 66 y.o.   MRN: [id number]  HPI  73 -year-old patient who has essential hypertension and a prior history of recurrent UTIs.  For the past week she has had increasing pressure, nocturia, urgency.  She also describes some low back pain and cloudy urine. She feels these symptoms are similar to prior UTIs  UA was reviewed and did reveal pyuria   Past Medical History:  Diagnosis Date  . Hypertension   . WPW (Wolff-Parkinson-White syndrome)      Social History   Social History  . Marital status: Married    Spouse name: N/A  . Number of children: N/A  . Years of education: N/A   Occupational History  . Not on file.   Social History Main Topics  . Smoking status: Former Smoker    Quit date: 10/28/1980  . Smokeless tobacco: Never Used  . Alcohol use 0.0 oz/week     Comment: occ  . Drug use: Unknown  . Sexual activity: Not on file   Other Topics Concern  . Not on file   Social History Narrative  . No narrative on file    Past Surgical History:  Procedure Laterality Date  . laparoscopic drainage of ovarian cyst      Family History  Problem Relation Age of Onset  . Heart disease Mother   . Heart failure Mother   . Hypertension Father   . Breast cancer Paternal Aunt   . Diabetes Sister   . Hypertension Sister     Allergies  Allergen Reactions  . Bactrim [Sulfamethoxazole-Trimethoprim]     REACTION: leg cramps & rash  . Cephalexin Diarrhea    Current Outpatient Prescriptions on File Prior to Visit  Medication Sig Dispense Refill  . lisinopril (PRINIVIL,ZESTRIL) 10 MG tablet TAKE ONE TABLET BY MOUTH ONCE DAILY 90 tablet 0  . Multiple Vitamin (MULTIVITAMIN) tablet Take 1 tablet by mouth daily.      . Probiotic Product (PRO-BIOTIC BLEND PO) Take 1 capsule by mouth daily.     No current facility-administered medications on file prior to visit.     BP 138/72 (BP Location: Left Arm, Patient  Position: Sitting, Cuff Size: Normal)   Pulse 84   Temp 98.7 F (37.1 C) (Oral)   Ht 5' 6.5" (1.689 m)   Wt 167 lb 3.2 oz (75.8 kg)   SpO2 98%   BMI 26.58 kg/m    Review of Systems  Constitutional: Negative.   HENT: Negative for congestion, dental problem, hearing loss, rhinorrhea, sinus pressure, sore throat and tinnitus.   Eyes: Negative for pain, discharge and visual disturbance.  Respiratory: Negative for cough and shortness of breath.   Cardiovascular: Negative for chest pain, palpitations and leg swelling.  Gastrointestinal: Negative for abdominal distention, abdominal pain, blood in stool, constipation, diarrhea, nausea and vomiting.  Genitourinary: Positive for difficulty urinating, dysuria, frequency and urgency. Negative for flank pain, hematuria, pelvic pain, vaginal bleeding, vaginal discharge and vaginal pain.  Musculoskeletal: Positive for back pain. Negative for arthralgias, gait problem and joint swelling.  Skin: Negative for rash.  Neurological: Negative for dizziness, syncope, speech difficulty, weakness, numbness and headaches.  Hematological: Negative for adenopathy.  Psychiatric/Behavioral: Negative for agitation, behavioral problems and dysphoric mood. The patient is not nervous/anxious.        Objective:   Physical Exam  Constitutional: She appears well-developed and well-nourished. She appears distressed.  Abdominal: Soft. Bowel sounds are normal.  There is no tenderness.          Assessment & Plan:   UTI.  Will treat with Cipro for 5 days Liberal fluid intake.  Encouraged  Christine Livingston

## 2016-11-30 ENCOUNTER — Other Ambulatory Visit: Payer: Self-pay | Admitting: Internal Medicine

## 2016-12-02 ENCOUNTER — Other Ambulatory Visit: Payer: Self-pay | Admitting: Internal Medicine

## 2017-01-07 DIAGNOSIS — K644 Residual hemorrhoidal skin tags: Secondary | ICD-10-CM | POA: Insufficient documentation

## 2017-01-07 DIAGNOSIS — I456 Pre-excitation syndrome: Secondary | ICD-10-CM | POA: Insufficient documentation

## 2017-01-07 DIAGNOSIS — I10 Essential (primary) hypertension: Secondary | ICD-10-CM | POA: Insufficient documentation

## 2017-01-16 ENCOUNTER — Encounter: Payer: Self-pay | Admitting: Family Medicine

## 2017-01-16 ENCOUNTER — Ambulatory Visit (INDEPENDENT_AMBULATORY_CARE_PROVIDER_SITE_OTHER): Payer: Medicare Other | Admitting: Family Medicine

## 2017-01-16 VITALS — BP 130/80 | HR 86 | Resp 12 | Ht 66.5 in | Wt 166.0 lb

## 2017-01-16 DIAGNOSIS — R350 Frequency of micturition: Secondary | ICD-10-CM | POA: Diagnosis not present

## 2017-01-16 DIAGNOSIS — N390 Urinary tract infection, site not specified: Secondary | ICD-10-CM | POA: Diagnosis not present

## 2017-01-16 LAB — POCT URINALYSIS DIPSTICK
BILIRUBIN UA: NEGATIVE
Glucose, UA: NEGATIVE
Ketones, UA: NEGATIVE
NITRITE UA: NEGATIVE
PH UA: 6 (ref 5.0–8.0)
PROTEIN UA: NEGATIVE
RBC UA: NEGATIVE
Spec Grav, UA: 1.01 (ref 1.010–1.025)
UROBILINOGEN UA: 0.2 U/dL

## 2017-01-16 MED ORDER — NITROFURANTOIN MONOHYD MACRO 100 MG PO CAPS: 100.0000 mg | ORAL_CAPSULE | Freq: Two times a day (BID) | ORAL | 0 refills | Status: AC

## 2017-01-16 NOTE — Progress Notes (Signed)
ACUTE VISIT:  HPI:  Chief Complaint  Patient presents with  . Urinary Frequency    Ms.Christine Livingston is a 66 y.o. female, who is here today complaining of 5-7 days of urinary symptoms, progressively getting worse.   Dysuria: Denies but reports pressure sensation upon voiding. Urinary frequency: Yes and urinary tenesmus. Urinary urgency: Yes Incontinence: Denies Gross hematuria: Denies  Denies fever,chills, decreased appetite,abdominal pain,nausea, or vomiting  Abnormal vaginal bleeding or discharge: Denies  PTW:[facility] menopausal Sexual activity: Yes, not frequently. + Former smoker.  Hx of UTI: Yes. She has had a few UTI within the past year.She is not sure about exacerbating factors. She is drinking fluids as usual, she is not holding her urine for several hours, and has not increased sex intercourse. She denies Hx of DM.  Ucx 03/2016 grew Proteus Mirabilis > 100,000 CFU.  OTC medications for this problem: None   Review of Systems  Constitutional: Negative for activity change, appetite change, fatigue and fever.  Gastrointestinal: Negative for abdominal pain, constipation, diarrhea, nausea and vomiting.  Genitourinary: Positive for frequency and urgency. Negative for decreased urine volume, dysuria, flank pain, hematuria, vaginal bleeding and vaginal discharge.  Musculoskeletal: Negative for back pain and myalgias.  Psychiatric/Behavioral: Negative for confusion. The patient is not nervous/anxious.      Current Outpatient Prescriptions on File Prior to Visit  Medication Sig Dispense Refill  . lisinopril (PRINIVIL,ZESTRIL) 10 MG tablet TAKE ONE TABLET BY MOUTH ONCE DAILY 90 tablet 0  . Multiple Vitamin (MULTIVITAMIN) tablet Take 1 tablet by mouth daily.      . Probiotic Product (PRO-BIOTIC BLEND PO) Take 1 capsule by mouth daily.     No current facility-administered medications on file prior to visit.      Past Medical History:  Diagnosis Date  .  Hypertension   . WPW (Wolff-Parkinson-White syndrome)    Allergies  Allergen Reactions  . Bactrim [Sulfamethoxazole-Trimethoprim]     REACTION: leg cramps & rash  . Cephalexin Diarrhea    Social History   Social History  . Marital status: Married    Spouse name: N/A  . Number of children: N/A  . Years of education: N/A   Social History Main Topics  . Smoking status: Former Smoker    Quit date: 10/28/1980  . Smokeless tobacco: Never Used  . Alcohol use 0.0 oz/week     Comment: occ  . Drug use: Unknown  . Sexual activity: Not Asked   Other Topics Concern  . None   Social History Narrative  . None    Vitals:   01/16/17 1056  BP: 130/80  Pulse: 86  Resp: 12   Body mass index is 26.39 kg/m.   Physical Exam  Nursing note and vitals reviewed. Constitutional: She is oriented to person, place, and time. She appears well-developed and well-nourished. No distress.  HENT:  Head: Atraumatic.  Mouth/Throat: Oropharynx is clear and moist and mucous membranes are normal.  Eyes: Conjunctivae are normal.  Cardiovascular: Normal rate and regular rhythm.   Respiratory: Effort normal and breath sounds normal. No respiratory distress.  GI: Soft. She exhibits no mass. There is no hepatomegaly. There is no tenderness. There is no CVA tenderness.  Musculoskeletal: She exhibits no edema or tenderness.  Neurological: She is alert and oriented to person, place, and time. She has normal strength. Gait normal.  Skin: Skin is warm. No erythema.  Psychiatric: She has a normal mood and affect.  Well groomed, good eye  contact.     ASSESSMENT AND PLAN:   Emireth was seen today for urinary frequency.  Diagnoses and all orders for this visit:  Frequent urination  Plan discussed other possible etiologies. Urine dipstick LEU 2+, rest otherwise negative.  -     POCT urinalysis dipstick -     Urine culture  Urinary tract infection without hematuria, site unspecified   Ucx  ordered.   Empiric abx treatment started today and will be tailored according to Ucx results and susceptibility report. OTC Vit C or cranberry pills may help. Adequate hydration. Clearly instructed about warning signs. F/U if symptoms persist.   -     Urine culture -     nitrofurantoin, macrocrystal-monohydrate, (MACROBID) 100 MG capsule; Take 1 capsule (100 mg total) by mouth 2 (two) times daily.     Betty G. Martinique, MD  [facility]. [place] office.

## 2017-01-16 NOTE — Patient Instructions (Addendum)
  Ms.Sarena Asby I have seen you today for an acute visit.  A few things to remember from today's visit:   Frequent urination - Plan: POCT urinalysis dipstick, Urine culture  Urinary tract infection without hematuria, site unspecified - Plan: Urine culture, nitrofurantoin, macrocrystal-monohydrate, (MACROBID) 100 MG capsule       Adequate fluid intake, avoid holding urine for long hours, and over the counter Vit C OR cranberry capsules might help.  Today we will treat empirically with antibiotic, which we might need to change when urine culture comes back depending of bacteria susceptibility.  Seek immediate medical attention if severe abdominal pain, vomiting, fever/chills, or worsening symptoms. F/U if symptomatic are not any better after 2-3 days of antibiotic treatment.     Medications prescribed today are intended for short period of time and will not be refill upon request, a follow up appointment might be necessary to discuss continuation of of treatment if appropriate.     In general please monitor for signs of worsening symptoms and seek immediate medical attention if any concerning.  If symptoms are not resolved in 3-4 days you should schedule a follow up appointment with your doctor, before if needed.

## 2017-01-19 LAB — URINE CULTURE

## 2017-03-25 ENCOUNTER — Encounter: Payer: Medicare Other | Admitting: Internal Medicine

## 2017-04-02 ENCOUNTER — Other Ambulatory Visit: Payer: Medicare Other

## 2017-04-07 ENCOUNTER — Ambulatory Visit (INDEPENDENT_AMBULATORY_CARE_PROVIDER_SITE_OTHER): Payer: Medicare Other | Admitting: Internal Medicine

## 2017-04-07 ENCOUNTER — Encounter: Payer: Self-pay | Admitting: Internal Medicine

## 2017-04-07 VITALS — BP 138/72 | HR 72 | Temp 98.1°F | Ht 66.5 in | Wt 168.2 lb

## 2017-04-07 DIAGNOSIS — I10 Essential (primary) hypertension: Secondary | ICD-10-CM | POA: Diagnosis not present

## 2017-04-07 DIAGNOSIS — Z86010 Personal history of colonic polyps: Secondary | ICD-10-CM

## 2017-04-07 DIAGNOSIS — Z0000 Encounter for general adult medical examination without abnormal findings: Secondary | ICD-10-CM

## 2017-04-07 DIAGNOSIS — I456 Pre-excitation syndrome: Secondary | ICD-10-CM | POA: Diagnosis not present

## 2017-04-07 LAB — CBC WITH DIFFERENTIAL/PLATELET
BASOS ABS: 0 10*3/uL (ref 0.0–0.1)
Basophils Relative: 0.8 % (ref 0.0–3.0)
EOS PCT: 1.1 % (ref 0.0–5.0)
Eosinophils Absolute: 0 10*3/uL (ref 0.0–0.7)
HCT: 44.1 % (ref 36.0–46.0)
Hemoglobin: 14.5 g/dL (ref 12.0–15.0)
LYMPHS ABS: 1.5 10*3/uL (ref 0.7–4.0)
Lymphocytes Relative: 37 % (ref 12.0–46.0)
MCHC: 33 g/dL (ref 30.0–36.0)
MCV: 87.1 fl (ref 78.0–100.0)
MONO ABS: 0.3 10*3/uL (ref 0.1–1.0)
Monocytes Relative: 7.1 % (ref 3.0–12.0)
NEUTROS PCT: 54 % (ref 43.0–77.0)
Neutro Abs: 2.2 10*3/uL (ref 1.4–7.7)
Platelets: 218 10*3/uL (ref 150.0–400.0)
RBC: 5.06 Mil/uL (ref 3.87–5.11)
RDW: 13.7 % (ref 11.5–15.5)
WBC: 4.1 10*3/uL (ref 4.0–10.5)

## 2017-04-07 LAB — COMPREHENSIVE METABOLIC PANEL
ALK PHOS: 74 U/L (ref 39–117)
ALT: 16 U/L (ref 0–35)
AST: 20 U/L (ref 0–37)
Albumin: 4.5 g/dL (ref 3.5–5.2)
BILIRUBIN TOTAL: 0.7 mg/dL (ref 0.2–1.2)
BUN: 9 mg/dL (ref 6–23)
CO2: 29 mEq/L (ref 19–32)
Calcium: 9.5 mg/dL (ref 8.4–10.5)
Chloride: 103 mEq/L (ref 96–112)
Creatinine, Ser: 0.67 mg/dL (ref 0.40–1.20)
GFR: 93.58 mL/min (ref >60.00)
Glucose, Bld: 93 mg/dL (ref 70–99)
Potassium: 4 mEq/L (ref 3.5–5.1)
SODIUM: 141 meq/L (ref 135–145)
TOTAL PROTEIN: 6.6 g/dL (ref 6.0–8.3)

## 2017-04-07 LAB — LIPID PANEL
Cholesterol: 231 mg/dL — ABNORMAL HIGH (ref 0–200)
HDL: 48.5 mg/dL (ref >39.00)
LDL Cholesterol: 157 mg/dL — ABNORMAL HIGH (ref 0–99)
NONHDL: 182.44
Total CHOL/HDL Ratio: 5
Triglycerides: 126 mg/dL (ref 0.0–149.0)
VLDL: 25.2 mg/dL (ref 0.0–40.0)

## 2017-04-07 LAB — TSH: TSH: 1.34 u[IU]/mL (ref 0.35–4.50)

## 2017-04-07 MED ORDER — LISINOPRIL 10 MG PO TABS: 10.0000 mg | ORAL_TABLET | Freq: Every day | ORAL | 6 refills | Status: DC

## 2017-04-07 NOTE — Patient Instructions (Addendum)

## 2017-04-07 NOTE — Progress Notes (Signed)
Subjective:    Patient ID: Christine Livingston, female    DOB: [birth year], 66 y.o.   MRN: [id number]  HPI  66 year old patient seen today for a preventive health examination  She has essential hypertension which has been quite stable. No concerns or complaints. She does have a history of WPW, which has been asymptomatic  She was seen about 2 months ago for recurrent UTI. Since her last annual exam, she has had procedure for hemorrhoidal disease.  No concerns or complaints today  Past Medical History:  Diagnosis Date  . Hypertension   . WPW (Wolff-Parkinson-White syndrome)      Social History   Social History  . Marital status: Married    Spouse name: N/A  . Number of children: N/A  . Years of education: N/A   Occupational History  . Not on file.   Social History Main Topics  . Smoking status: Former Smoker    Quit date: 10/28/1980  . Smokeless tobacco: Never Used  . Alcohol use 0.0 oz/week     Comment: occ  . Drug use: Unknown  . Sexual activity: Not on file   Other Topics Concern  . Not on file   Social History Narrative  . No narrative on file    Past Surgical History:  Procedure Laterality Date  . laparoscopic drainage of ovarian cyst      Family History  Problem Relation Age of Onset  . Heart disease Mother   . Heart failure Mother   . Hypertension Father   . Breast cancer Paternal Aunt   . Diabetes Sister   . Hypertension Sister     Allergies  Allergen Reactions  . Bactrim [Sulfamethoxazole-Trimethoprim]     REACTION: leg cramps & rash  . Cephalexin Diarrhea    No current outpatient prescriptions on file prior to visit.   No current facility-administered medications on file prior to visit.     BP 138/72 (BP Location: Left Arm, Patient Position: Sitting, Cuff Size: Normal)   Pulse 72   Temp 98.1 F (36.7 C) (Oral)   Ht 5' 6.5" (1.689 m)   Wt 168 lb 3.2 oz (76.3 kg)   SpO2 98%   BMI 26.74 kg/m    Subsequent Medicare wellness  visit  1. Risk factors, based on past  M,S,F history.  Cardio vascular risk factors include hypertension only  2.  Physical activities:no activity restrictions does have a health club membership that she uses infrequently.  Does walk 2-3 times per week.  No exercise limitations  3.  Depression/mood:no history of major depression or mood disorder  4.  Hearing: mild  deficits  5.  ADL's:independent  6.  Fall risk:low  7.  Home safety:no problems identified  8.  Height weight, and visual acuity;height and weight stable no change in visual acuity  9.  Counseling:continue efforts at modest weight loss.  Continue home blood pressure monitoring.  Will rigorous exercise activities.  Encouraged  10. Lab orders based on risk factors:laboratory update will be reviewed  11. Referral :follow-up OB/GYN  12. Care plan:continue efforts at aggressive risk factor modification  13. Cognitive assessment: alert and oriented with normal affect no cognitive dysfunction  14. Screening: Patient provided with a written and personalized 5-10 year screening schedule in the AVS.    15. Provider List Update: includes primary care ophthalmology OB/GYN and GI     Review of Systems  Constitutional: Negative for appetite change, fatigue, fever and unexpected weight change.  HENT: Negative for congestion,  dental problem, ear pain, hearing loss, mouth sores, nosebleeds, sinus pressure, sore throat, tinnitus, trouble swallowing and voice change.   Eyes: Negative for photophobia, pain, redness and visual disturbance.  Respiratory: Negative for cough, chest tightness and shortness of breath.   Cardiovascular: Negative for chest pain, palpitations and leg swelling.  Gastrointestinal: Negative for abdominal distention, abdominal pain, blood in stool, constipation, diarrhea, nausea, rectal pain and vomiting.  Genitourinary: Negative for difficulty urinating, dysuria, flank pain, frequency, genital sores, hematuria,  menstrual problem, pelvic pain, urgency, vaginal bleeding, vaginal discharge and vaginal pain.  Musculoskeletal: Negative for arthralgias, back pain and neck stiffness.  Skin: Negative for rash.  Neurological: Negative for dizziness, syncope, speech difficulty, weakness, light-headedness, numbness and headaches.  Hematological: Negative for adenopathy. Does not bruise/bleed easily.  Psychiatric/Behavioral: Negative for agitation, behavioral problems, dysphoric mood, self-injury and suicidal ideas. The patient is not nervous/anxious.        Objective:   Physical Exam  Constitutional: She is oriented to person, place, and time. She appears well-developed and well-nourished.  HENT:  Head: Normocephalic and atraumatic.  Right Ear: External ear normal.  Left Ear: External ear normal.  Mouth/Throat: Oropharynx is clear and moist.  Pharyngeal crowding  Eyes: Conjunctivae and EOM are normal.  Neck: Normal range of motion. Neck supple. No JVD present. No thyromegaly present.  Cardiovascular: Normal rate, regular rhythm, normal heart sounds and intact distal pulses.   No murmur heard. Pulmonary/Chest: Effort normal and breath sounds normal. She has no wheezes. She has no rales.  Abdominal: Soft. Bowel sounds are normal. She exhibits no distension and no mass. There is no tenderness. There is no rebound and no guarding.  Musculoskeletal: Normal range of motion. She exhibits no edema or tenderness.  Neurological: She is alert and oriented to person, place, and time. She has normal reflexes. No cranial nerve deficit. She exhibits normal muscle tone. Coordination normal.  Skin: Skin is warm and dry. No rash noted.  Psychiatric: She has a normal mood and affect. Her behavior is normal.          Assessment & Plan:   Preventive health examination Subsequent Medicare wellness visit Essential hypertension, well-controlled WPW.  Asymptomatic  No change in medical regimen Review screening  lab Continue home blood pressure monitoring Modest weight loss encouraged More rigorous exercise activities.  Recommended  Follow-up one year or as needed  Nyoka Cowden

## 2017-04-10 IMAGING — CT CT RENAL STONE PROTOCOL
2 of 4 series · 16 of 46 positions shown, 18 images · non-contrast
Comparison: April 29, 2007 CT abdomen and pelvis and pelvic
ultrasound May 17, 2007

CLINICAL DATA: Three-day history of right flank pain

EXAM:
CT ABDOMEN AND PELVIS WITHOUT CONTRAST
TECHNIQUE: Multidetector CT imaging of the abdomen and pelvis was performed
following the standard protocol without oral or intravenous contrast
material administration.

[Series 2: stone study 5.0 i30f 1 · axial · 0.92mm/px · z∈[-487,-72]mm · 13 of 93 slices shown, 15 images]
[im 5/93  soft-tissue]
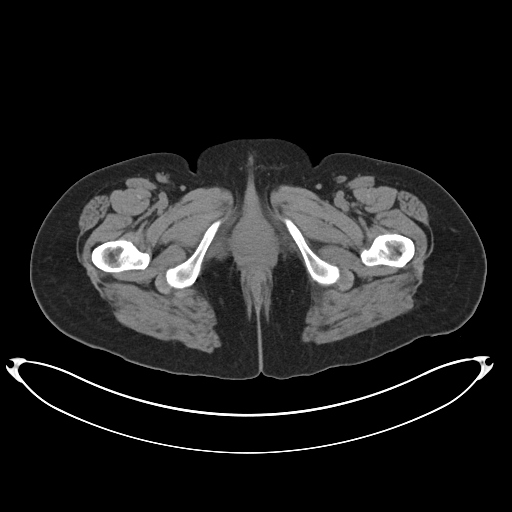
[im 5/93  bone]
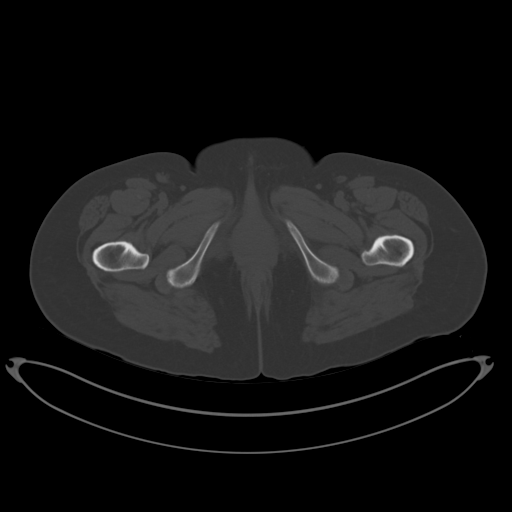
[im 14/93  soft-tissue]
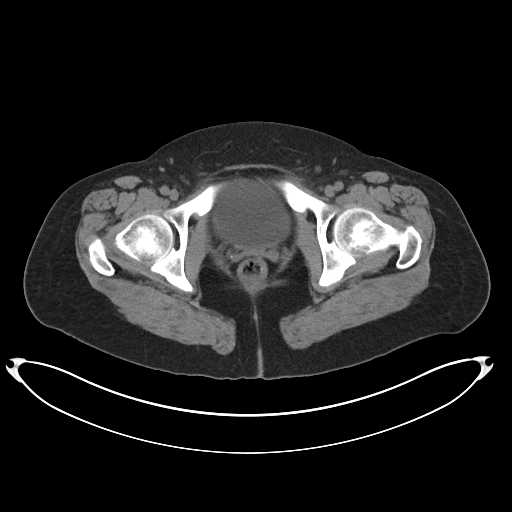
[im 19/93  soft-tissue]
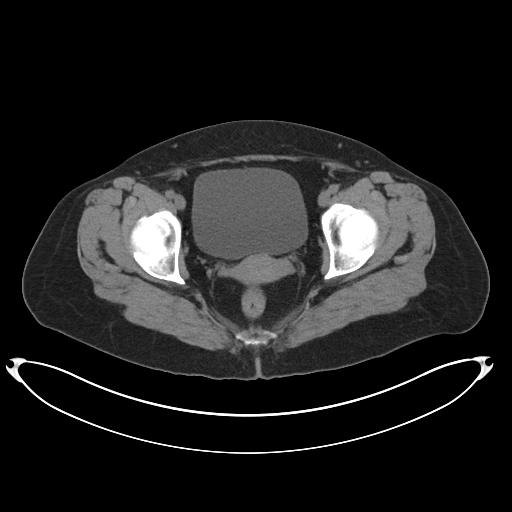
[im 28/93  soft-tissue]
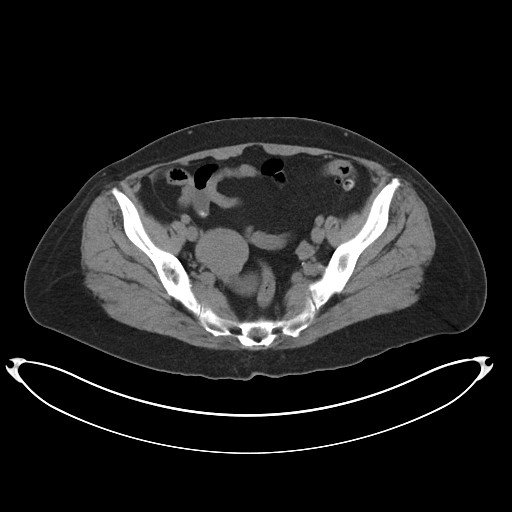
[im 33/93  soft-tissue]
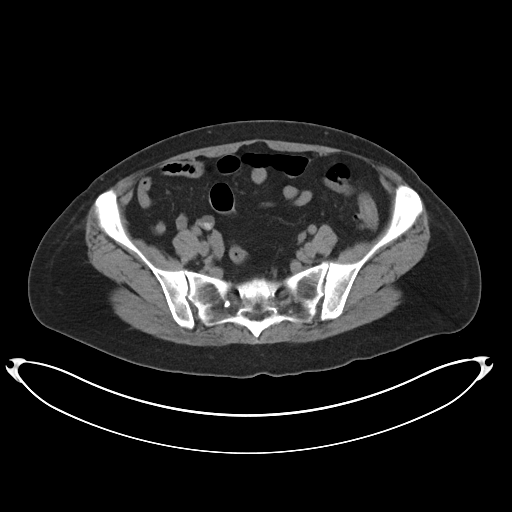
[im 42/93  soft-tissue]
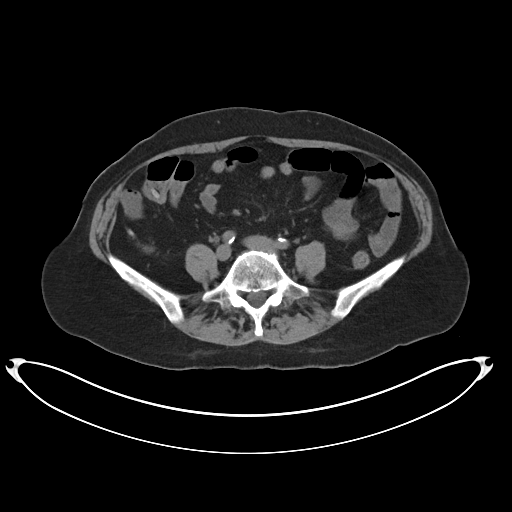
[im 47/93  soft-tissue]
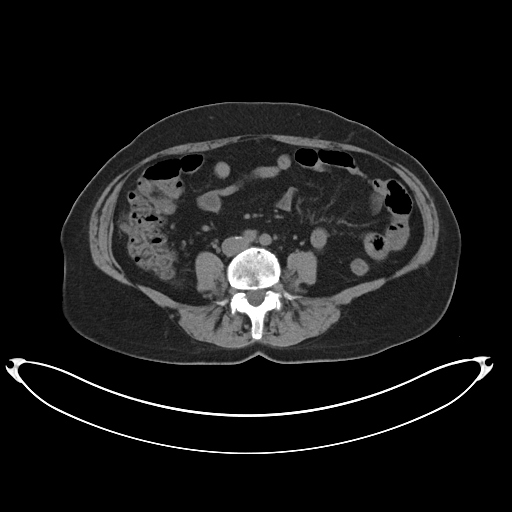
[im 51/93  soft-tissue]
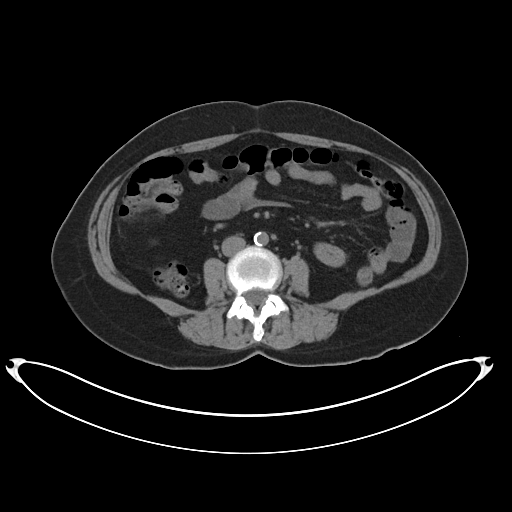
[im 60/93  soft-tissue]
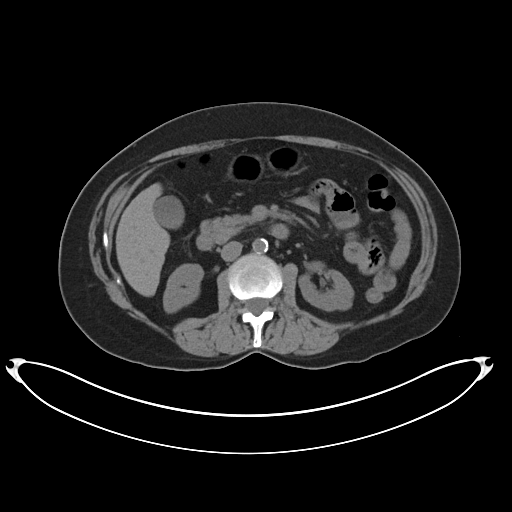
[im 60/93  bone]
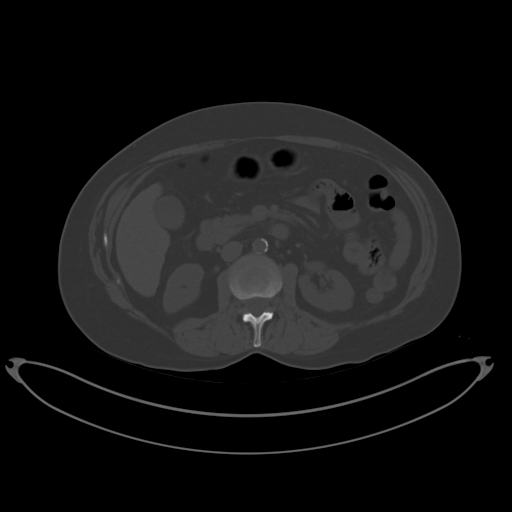
[im 65/93  soft-tissue]
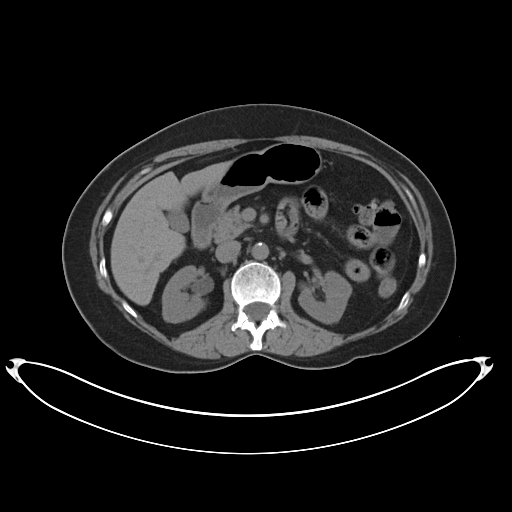
[im 74/93  soft-tissue]
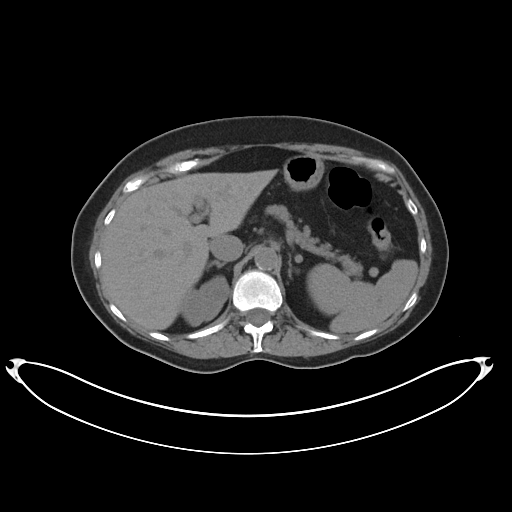
[im 79/93  soft-tissue]
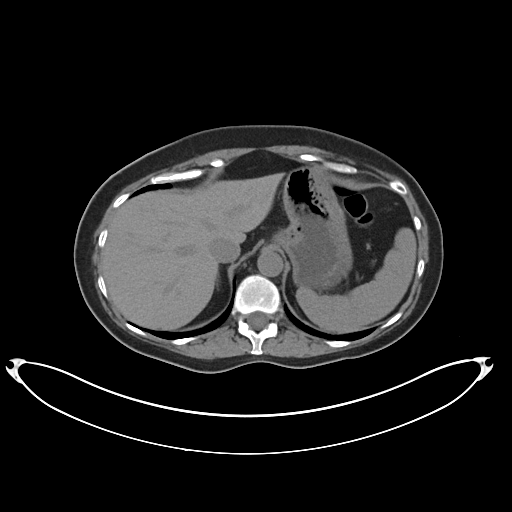
[im 88/93  soft-tissue]
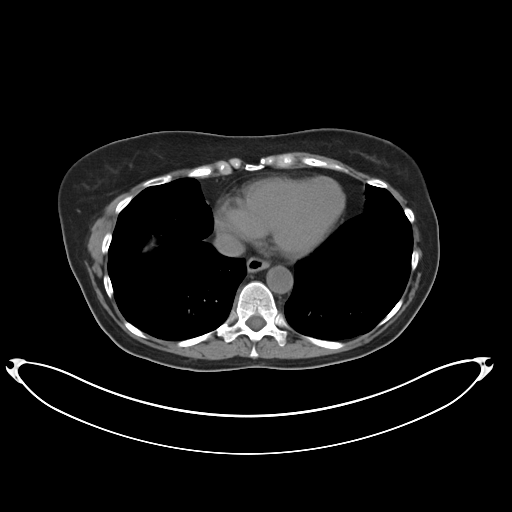

[Series 5: coronal soft tissue · coronal · 0.92mm/px · 3 of 96 slices shown]
[im 32/96  soft-tissue]
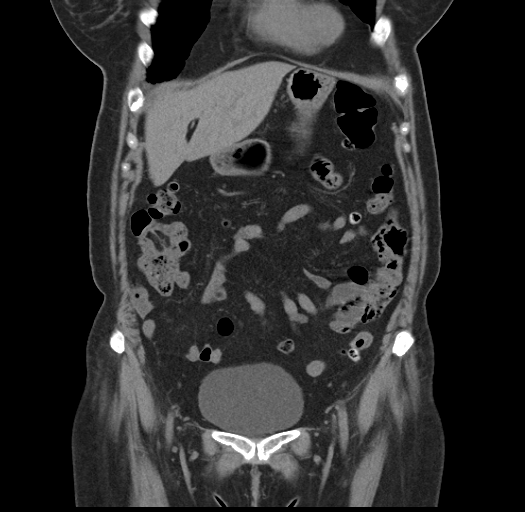
[im 43/96  soft-tissue]
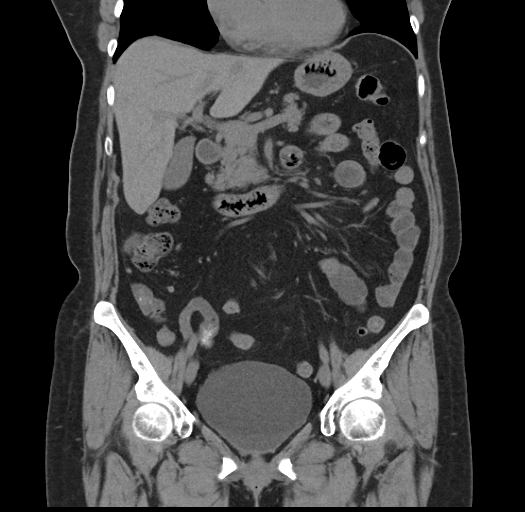
[im 53/96  soft-tissue]
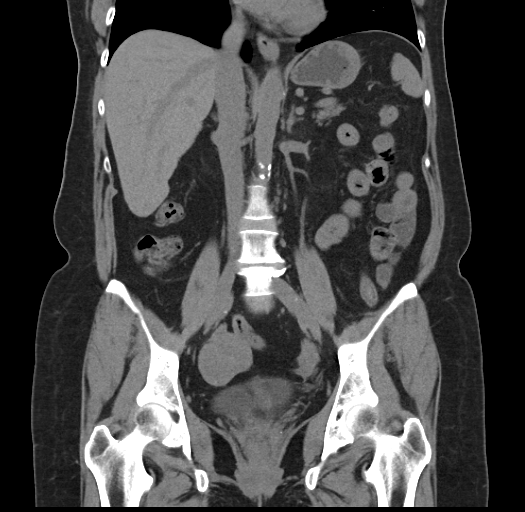

[16 of 46 positions shown; findings below may reference images not displayed]

FINDINGS: Lower chest: There is slight posterior bibasilar atelectasis. Lung
bases are otherwise clear.

Hepatobiliary: No focal liver lesions are identified on this
noncontrast enhanced study. Gallbladder wall is not thickened. There
is no biliary duct dilatation.

Pancreas: No pancreatic mass or inflammatory focus.

Spleen: No splenic lesions are evident. A small accessory spleen is
noted medial to the spleen.

Adrenals/Urinary Tract: Adrenals appear normal bilaterally. Kidneys
bilaterally show no mass or hydronephrosis on either side. There is
no renal or ureteral calculus on either side. Urinary bladder is
midline with wall thickness within normal limits.

Stomach/Bowel: There is no bowel wall or mesenteric thickening. No
bowel obstruction. No free air or portal venous air.

Vascular/Lymphatic: There are foci of atherosclerotic calcification
in the aorta and proximal common iliac arteries. There is no
abdominal aortic aneurysm. Major mesenteric vessels show no
appreciable calcification. No adenopathy is evident in the abdomen
or pelvis.

Reproductive: There is again noted a mass in the right adnexal
region which appears to arise eccentrically from the rightward
aspect of the uterus. This mass measures 4.9 x 4.2 x 4.2 cm and
appears slightly smaller compared to the previous study from 1008.
It most likely represents an eccentric leiomyoma arising from the
uterus. No other pelvic mass is appreciable. Neither ovary is
convincingly seen on CT. There is no pelvic fluid collection.

Other: The appendix appears normal. There is a small ventral hernia
containing only fat. There is no abscess or ascites in the abdomen
or pelvis.

Musculoskeletal: There is degenerative change at L4-5 with disc
space narrowing at this level. There are no blastic or lytic bone
lesions. There is no intramuscular or abdominal wall lesion.
IMPRESSION: Presumed eccentric leiomyoma arising from the rightward aspect of
the uterus, slightly smaller compared to the 1008 study. No new
pelvic mass.

Prominent liver without focal lesion on this noncontrast enhanced
study.

No renal or ureteral calculus.  No hydronephrosis.

Appendix appears normal. No bowel obstruction. No abscess. Proximal
sigmoid diverticula present without apparent diverticulitis.

## 2017-04-23 ENCOUNTER — Encounter: Payer: Self-pay | Admitting: Internal Medicine

## 2017-08-07 ENCOUNTER — Ambulatory Visit: Payer: Medicare Other | Admitting: Physician Assistant

## 2017-08-07 ENCOUNTER — Other Ambulatory Visit: Payer: Self-pay

## 2017-08-07 ENCOUNTER — Encounter: Payer: Self-pay | Admitting: Physician Assistant

## 2017-08-07 VITALS — BP 134/72 | HR 98 | Temp 99.3°F | Resp 18 | Ht 66.5 in | Wt 168.0 lb

## 2017-08-07 DIAGNOSIS — J029 Acute pharyngitis, unspecified: Secondary | ICD-10-CM

## 2017-08-07 LAB — POCT RAPID STREP A (OFFICE): Rapid Strep A Screen: NEGATIVE

## 2017-08-07 MED ORDER — MENTHOL 3 MG MT LOZG: 1.0000 | LOZENGE | OROMUCOSAL | 12 refills | Status: DC | PRN

## 2017-08-07 NOTE — Patient Instructions (Addendum)
Your rapid strep test is negative. We will call you if your culture comes back positive.  Stay well hydrated. Get lost of rest.   Advil or ibuprofen for pain. Do not take Aspirin.  Throat lozenges (if you are not at risk for choking) or sprays may be used to soothe your throat. Drink enough water and fluids to keep your urine clear or pale yellow. For sore throat: ? Gargle with 8 oz of salt water ( tsp of salt per 1 qt of water) as often as every 1-2 hours to soothe your throat.  Gargle liquid benadryl.  Use Elderberry syrup.   For sore throat try using a honey-based tea. Use 3 teaspoons of honey with juice squeezed from half lemon. Place shaved pieces of ginger into 1/2-1 cup of water and warm over stove top. Then mix the ingredients and repeat every 4 hours as needed.  Cough Syrup Recipe: Sweet Lemon & Honey Thyme  Ingredients a handful of fresh thyme sprigs   1 pint of water (2 cups)  1/2 cup honey (raw is best, but regular will do)  1/2 lemon chopped Instructions 1. Place the lemon in the pint jar and cover with the honey. The honey will macerate the lemons and draw out liquids which taste so delicious! 2. Meanwhile, toss the thyme leaves into a saucepan and cover them with the water. 3. Bring the water to a gentle simmer and reduce it to half, about a cup of tea. 4. When the tea is reduced and cooled a bit, strain the sprigs & leaves, add it into the pint jar and stir it well. 5. Give it a shake and use a spoonful as needed. 6. Store your homemade cough syrup in the refrigerator for about a month.  Thank you for coming in today. I hope you feel we met your needs.  Feel free to call PCP if you have any questions or further requests.  Please consider signing up for MyChart if you do not already have it, as this is a great way to communicate with me.  Best,  Christine McVey, PA-C  IF you received an x-ray today, you will receive an invoice from [facility]. Please contact  [facility] at [phone] with questions or concerns regarding your invoice.   IF you received labwork today, you will receive an invoice from [facility]. Please contact [facility] at [phone] with questions or concerns regarding your invoice.   Our billing staff will not be able to assist you with questions regarding bills from these companies.  You will be contacted with the lab results as soon as they are available. The fastest way to get your results is to activate your My Chart account. Instructions are located on the last page of this paperwork. If you have not heard from [place] regarding the results in 2 weeks, please contact this office.

## 2017-08-07 NOTE — Progress Notes (Signed)
   Jeanann Balinski  MRN: [id number] DOB: 13-Sep-1950  PCP: Marletta Lor, MD  Subjective:  Pt is a 67 year old female PMH HTN, WPW who presents to clinic for sore throat x 2 days. Symptoms started yesterday morning. She endorses generalized body aches. She is feeling better today than she did yesterday. Denies sneezing, cough, difficulty breathing or swallowing, runny nose, headache, rash.  She has been gargling with salt water and taking Tylenol.   Review of Systems  Constitutional: Negative for chills, diaphoresis, fatigue and fever.  HENT: Positive for sore throat. Negative for congestion, postnasal drip, rhinorrhea, sinus pressure and sinus pain.   Respiratory: Negative for cough, shortness of breath and wheezing.   Psychiatric/Behavioral: Negative for sleep disturbance.    Patient Active Problem List   Diagnosis Date Noted  . Undiagnosed cardiac murmurs 03/15/2016  . History of colonic polyps 01/30/2015  . WOLFF (WOLFE)-PARKINSON-WHITE (WPW) SYNDROME 02/03/2007  . Essential hypertension 02/01/2007    Current Outpatient Medications on File Prior to Visit  Medication Sig Dispense Refill  . lisinopril (PRINIVIL,ZESTRIL) 10 MG tablet Take 1 tablet (10 mg total) by mouth daily. 90 tablet 6  . Wheat Dextrin (BENEFIBER DRINK MIX PO) Take 2 Doses by mouth every morning.     No current facility-administered medications on file prior to visit.     Allergies  Allergen Reactions  . Bactrim [Sulfamethoxazole-Trimethoprim]     REACTION: leg cramps & rash  . Cephalexin Diarrhea     Objective:  BP 134/72   Pulse 98   Temp 99.3 F (37.4 C) (Oral)   Resp 18   Ht 5' 6.5" (1.689 m)   Wt 168 lb (76.2 kg)   SpO2 96%   BMI 26.71 kg/m   Physical Exam  Constitutional: She is oriented to person, place, and time and well-developed, well-nourished, and in no distress. No distress.  HENT:  Right Ear: Tympanic membrane normal.  Left Ear: Tympanic membrane normal.  Nose: Mucosal edema  present. No rhinorrhea. Right sinus exhibits no maxillary sinus tenderness and no frontal sinus tenderness. Left sinus exhibits no maxillary sinus tenderness and no frontal sinus tenderness.  Mouth/Throat: Oropharynx is clear and moist and mucous membranes are normal.  Cardiovascular: Normal rate, regular rhythm and normal heart sounds.  Pulmonary/Chest: Effort normal and breath sounds normal. No respiratory distress. She has no wheezes. She has no rales.  Neurological: She is alert and oriented to person, place, and time. GCS score is 15.  Skin: Skin is warm and dry.  Psychiatric: Mood, memory, affect and judgment normal.  Vitals reviewed.  Results for orders placed or performed in visit on 08/07/17  POCT rapid strep A  Result Value Ref Range   Rapid Strep A Screen Negative Negative    Assessment and Plan :  1. Viral pharyngitis 2. Sore throat - POCT rapid strep A - Culture, Group A Strep - menthol-cetylpyridinium (CEPACOL REGULAR STRENGTH) 3 MG lozenge; Take 1 lozenge (3 mg total) by mouth as needed for sore throat.  Dispense: 100 tablet; Refill: 12 - Pt c/o 2 days sore throat. Vitals are stable. No concerning findings on PE. Rapid strep is negative. Culture is pending. Will treat for viral pharyngitis. RTC in 3 days if symptoms worsen.    Mercer Pod, PA-C  [facility] [place] 08/07/2017 8:35 AM

## 2017-08-10 ENCOUNTER — Telehealth: Payer: Self-pay | Admitting: Physician Assistant

## 2017-08-10 LAB — CULTURE, GROUP A STREP: Strep A Culture: NEGATIVE

## 2017-08-10 NOTE — Telephone Encounter (Signed)
Copied from [place] [id number]. Topic: Quick Communication - See Telephone Encounter >> Aug 10, 2017  8:36 AM Burnis Medin, NT wrote: CRM for notification. See Telephone encounter for: Pt called to see if her lab cultures were back and would like a call back.  08/10/17.

## 2017-08-10 NOTE — Telephone Encounter (Signed)
Patient informed. 

## 2017-08-11 ENCOUNTER — Encounter: Payer: Self-pay | Admitting: Family Medicine

## 2017-08-11 ENCOUNTER — Ambulatory Visit: Payer: Medicare Other | Admitting: Family Medicine

## 2017-08-11 VITALS — BP 150/100 | HR 104 | Temp 99.7°F | Wt 165.0 lb

## 2017-08-11 DIAGNOSIS — J029 Acute pharyngitis, unspecified: Secondary | ICD-10-CM

## 2017-08-11 DIAGNOSIS — B9789 Other viral agents as the cause of diseases classified elsewhere: Secondary | ICD-10-CM

## 2017-08-11 DIAGNOSIS — J028 Acute pharyngitis due to other specified organisms: Principal | ICD-10-CM

## 2017-08-11 MED ORDER — HYDROCODONE-HOMATROPINE 5-1.5 MG/5ML PO SYRP: 5.0000 mL | ORAL_SOLUTION | Freq: Three times a day (TID) | ORAL | 0 refills | Status: DC | PRN

## 2017-08-11 NOTE — Patient Instructions (Signed)
Gargle with warm salt water  Chloraseptic ........ 5-6 times daily  Hydromet,,,,,,,,, 1/2 teaspoon 3 times daily when necessary for sore throat

## 2017-08-11 NOTE — Progress Notes (Signed)
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 Who comes in today for evaluation of a sore throat for one week  Week ago she developed a sore throat. And Friday she went to an urgent care had an evaluation which included a strep test which was negative. She was advised symptomatic therapy however sore throat has not gone away and she's here for reevaluation  [facility] with symptoms of urinary leakage. She was given an antibiotic she does not recall the name. She says she is allergic to Keflex and Bactrim  Not in contact with anybody ill  BP (!) 150/100 (BP Location: Left Arm, Patient Position: Sitting, Cuff Size: Normal)   Pulse (!) 104   Temp 99.7 F (37.6 C) (Oral)   Wt 165 lb (74.8 kg)   BMI 26.23 kg/m  HEENT were negative neck was supple thyroid not enlarged no adenopathy  Viral sore throat............ treat symptomatically.Marland KitchenMarland Kitchen

## 2017-08-25 ENCOUNTER — Encounter: Payer: Self-pay | Admitting: Family Medicine

## 2017-08-25 ENCOUNTER — Ambulatory Visit: Payer: Medicare Other | Admitting: Family Medicine

## 2017-08-25 VITALS — BP 128/88 | HR 88 | Temp 98.3°F | Wt 167.0 lb

## 2017-08-25 DIAGNOSIS — N814 Uterovaginal prolapse, unspecified: Secondary | ICD-10-CM | POA: Insufficient documentation

## 2017-08-25 LAB — POC URINALSYSI DIPSTICK (AUTOMATED)
BILIRUBIN UA: NEGATIVE
Blood, UA: NEGATIVE
Glucose, UA: NEGATIVE
Ketones, UA: NEGATIVE
LEUKOCYTES UA: NEGATIVE
NITRITE UA: NEGATIVE
PH UA: 6.5 (ref 5.0–8.0)
PROTEIN UA: NEGATIVE
Spec Grav, UA: 1.01 (ref 1.010–1.025)
Urobilinogen, UA: 0.2 E.U./dL

## 2017-08-25 NOTE — Patient Instructions (Signed)
Your symptoms of urinary frequency are related to your prolapsed uterus. Your urinalysis was normal. Once you get the pessary inserted your urinary frequency symptoms should abate

## 2017-08-25 NOTE — Progress Notes (Signed)
Christine Livingston is a 67 year old female nonsmoker who comes in today with a four-day history of frequent urination  She states January 7 she went to see her gynecologist with symptoms of urinary frequency. She was told she had infection and was given nitroglycerin Furadantin. Her symptoms resolved in a couple days.  For the last 4 days she's had more frequent urination once no she has a recurrent urinary tract infection.  She's due to have a past 3 put in by her gynecologist this coming Thursday because of a prolapsed uterus.  BP 128/88 (BP Location: Left Arm, Patient Position: Sitting, Cuff Size: Normal)   Pulse 88   Temp 98.3 F (36.8 C) (Oral)   Wt 167 lb (75.8 kg)   BMI 26.55 kg/m  Well-developed well-nourished female no acute distress  Urinalysis normal  #1 urinary frequency secondary to prolapsed uterus........[name] reassured there is no infection............ pessary will be inserted by her GYN this coming Thursday

## 2017-11-19 ENCOUNTER — Encounter: Payer: Self-pay | Admitting: Family Medicine

## 2017-11-19 ENCOUNTER — Ambulatory Visit: Payer: Medicare Other | Admitting: Family Medicine

## 2017-11-19 VITALS — BP 146/91 | HR 80 | Temp 97.8°F | Resp 16 | Wt 167.0 lb

## 2017-11-19 DIAGNOSIS — N3000 Acute cystitis without hematuria: Secondary | ICD-10-CM | POA: Diagnosis not present

## 2017-11-19 DIAGNOSIS — R300 Dysuria: Secondary | ICD-10-CM

## 2017-11-19 DIAGNOSIS — N390 Urinary tract infection, site not specified: Secondary | ICD-10-CM | POA: Diagnosis not present

## 2017-11-19 LAB — POCT URINALYSIS DIPSTICK
Bilirubin, UA: NEGATIVE
Glucose, UA: NEGATIVE
KETONES UA: NEGATIVE
NITRITE UA: NEGATIVE
PH UA: 6 (ref 5.0–8.0)
PROTEIN UA: NEGATIVE
SPEC GRAV UA: 1.02 (ref 1.010–1.025)
UROBILINOGEN UA: 0.2 U/dL

## 2017-11-19 MED ORDER — CIPROFLOXACIN HCL 500 MG PO TABS: 500.0000 mg | ORAL_TABLET | Freq: Two times a day (BID) | ORAL | 0 refills | Status: DC

## 2017-11-19 NOTE — Progress Notes (Signed)
OFFICE VISIT  11/19/2017   CC:  Chief Complaint  Patient presents with  . Back Pain    mid right back pain   HPI:    Patient is a 66 y.o.  female who presents for urinary complaints. Onset of R low-to-mid back pain this morning.  No dysuria.  Possibly some urinary frequency, +darker urine color. No fevers, no malaise.  No n/v or abd pain. This is similar to the way her past UTI's have started. No recent strain.  Tylenol no help.   Past Medical History:  Diagnosis Date  . Hypertension   . WPW (Wolff-Parkinson-White syndrome)     Past Surgical History:  Procedure Laterality Date  . laparoscopic drainage of ovarian cyst      Outpatient Medications Prior to Visit  Medication Sig Dispense Refill  . Calcium Carb-Cholecalciferol (CALCIUM-VITAMIN D) 500-200 MG-UNIT tablet Take by mouth.    [name] lisinopril (PRINIVIL,ZESTRIL) 10 MG tablet Take 1 tablet (10 mg total) by mouth daily. 90 tablet 6  . Wheat Dextrin (BENEFIBER DRINK MIX PO) Take 2 Doses by mouth every morning.    [name] HYDROcodone-homatropine (HYCODAN) 5-1.5 MG/5ML syrup Take 5 mLs by mouth every 8 (eight) hours as needed. (Patient not taking: Reported on 11/19/2017) 240 mL 0  . menthol-cetylpyridinium (CEPACOL REGULAR STRENGTH) 3 MG lozenge Take 1 lozenge (3 mg total) by mouth as needed for sore throat. (Patient not taking: Reported on 11/19/2017) 100 tablet 12   No facility-administered medications prior to visit.     Allergies  Allergen Reactions  . Bactrim [Sulfamethoxazole-Trimethoprim]     REACTION: leg cramps & rash  . Cephalexin Diarrhea    ROS As per HPI  PE: Blood pressure (!) 146/91, pulse 80, temperature 97.8 F (36.6 C), temperature source Oral, resp. rate 16, weight 167 lb (75.8 kg), SpO2 97 %. Gen: Alert, well appearing.  Patient is oriented to person, place, time, and situation. AFFECT: pleasant, lucid thought and speech. Back: mild discomfort to palpation in mid back on the right.  LABS:   Chemistry      Component Value Date/Time   NA 141 04/07/2017 0957   K 4.0 04/07/2017 0957   CL 103 04/07/2017 0957   CO2 29 04/07/2017 0957   BUN 9 04/07/2017 0957   CREATININE 0.67 04/07/2017 0957   CREATININE 0.72 03/12/2014 0839      Component Value Date/Time   CALCIUM 9.5 04/07/2017 0957   ALKPHOS 74 04/07/2017 0957   AST 20 04/07/2017 0957   ALT 16 04/07/2017 0957   BILITOT 0.7 04/07/2017 0957     CC UA today: 3+ Leu, trace blood, otherwise normal.  IMPRESSION AND PLAN:  Acute UTI. Cipro 500 mg bid x 5d. Sent urine for c/s.  An After Visit Summary was printed and given to the patient.  FOLLOW UP: Return if symptoms worsen or fail to improve.  Signed:  Crissie Sickles, MD           11/19/2017

## 2017-11-21 ENCOUNTER — Ambulatory Visit: Payer: Medicare Other | Admitting: Physician Assistant

## 2017-11-21 ENCOUNTER — Encounter: Payer: Self-pay | Admitting: Physician Assistant

## 2017-11-21 ENCOUNTER — Other Ambulatory Visit: Payer: Self-pay

## 2017-11-21 VITALS — BP 140/80 | HR 84 | Temp 98.6°F | Resp 16 | Ht 67.0 in | Wt 163.6 lb

## 2017-11-21 DIAGNOSIS — R350 Frequency of micturition: Secondary | ICD-10-CM | POA: Diagnosis not present

## 2017-11-21 DIAGNOSIS — R109 Unspecified abdominal pain: Secondary | ICD-10-CM | POA: Diagnosis not present

## 2017-11-21 LAB — URINE CULTURE
MICRO NUMBER:: 90481165
SPECIMEN QUALITY:: ADEQUATE

## 2017-11-21 LAB — POCT CBC
Granulocyte percent: 53.4 %G (ref 37–80)
HCT, POC: 46.8 % (ref 37.7–47.9)
HEMOGLOBIN: 15.5 g/dL (ref 12.2–16.2)
LYMPH, POC: 1.6 (ref 0.6–3.4)
MCH: 28.1 pg (ref 27–31.2)
MCHC: 33.1 g/dL (ref 31.8–35.4)
MCV: 84.8 fL (ref 80–97)
MID (cbc): 0.4 (ref 0–0.9)
MPV: 7.8 fL (ref 0–99.8)
PLATELET COUNT, POC: 274 10*3/uL (ref 142–424)
POC Granulocyte: 2.3 (ref 2–6.9)
POC LYMPH PERCENT: 38.1 %L (ref 10–50)
POC MID %: 8.5 % (ref 0–12)
RBC: 5.51 M/uL — AB (ref 4.04–5.48)
RDW, POC: 13.5 %
WBC: 4.3 10*3/uL — AB (ref 4.6–10.2)

## 2017-11-21 LAB — POCT URINALYSIS DIP (MANUAL ENTRY)
BILIRUBIN UA: NEGATIVE
Glucose, UA: NEGATIVE mg/dL
Ketones, POC UA: NEGATIVE mg/dL
NITRITE UA: NEGATIVE
PH UA: 7 (ref 5.0–8.0)
PROTEIN UA: NEGATIVE mg/dL
Spec Grav, UA: 1.02 (ref 1.010–1.025)
UROBILINOGEN UA: 0.2 U/dL

## 2017-11-21 LAB — POC MICROSCOPIC URINALYSIS (UMFC): Mucus: ABSENT

## 2017-11-21 MED ORDER — CYCLOBENZAPRINE HCL 10 MG PO TABS: 5.0000 mg | ORAL_TABLET | Freq: Three times a day (TID) | ORAL | 0 refills | Status: DC | PRN

## 2017-11-21 MED ORDER — AMOXICILLIN-POT CLAVULANATE 875-125 MG PO TABS: 1.0000 | ORAL_TABLET | Freq: Two times a day (BID) | ORAL | 0 refills | Status: DC

## 2017-11-21 NOTE — Patient Instructions (Addendum)
  Okay to take 400 mg ibuprofen every 6-8 hours as needed for back pain.  Okay to take this with the flexeril and augmentin.     IF you received an x-ray today, you will receive an invoice from [facility]. Please contact [facility] at [phone] with questions or concerns regarding your invoice.   IF you received labwork today, you will receive an invoice from [facility]. Please contact [facility] at [phone] with questions or concerns regarding your invoice.   Our billing staff will not be able to assist you with questions regarding bills from these companies.  You will be contacted with the lab results as soon as they are available. The fastest way to get your results is to activate your My Chart account. Instructions are located on the last page of this paperwork. If you have not heard from [place] regarding the results in 2 weeks, please contact this office.

## 2017-11-21 NOTE — Progress Notes (Signed)
11/24/2017 3:52 PM   DOB: 04-11-1951 / MRN: [id number]  SUBJECTIVE:  Christine [place] is a 67 y.o. female presenting for flank pain and urinary frequency.  This problem started roughly a week ago.  Patient has been taking antibiotics from her primary care provider and tells me that neither problem has improved greatly.  She was advised that she had a UTI during her last visit.  That culture was negative for any specific organism.  She denies fever, dizziness and is moving her bowels normally for her.  She denies any abnormal vaginal discharge.  She is allergic to bactrim [sulfamethoxazole-trimethoprim] and cephalexin.   She  has a past medical history of Hypertension and WPW (Wolff-Parkinson-White syndrome).    She  reports that she quit smoking about 37 years ago. She has never used smokeless tobacco. She reports that she drinks alcohol. She  has no sexual activity history on file. The patient  has a past surgical history that includes laparoscopic drainage of ovarian cyst.  Her family history includes Breast cancer in her paternal aunt; Diabetes in her sister; Heart disease in her mother; Heart failure in her mother; Hypertension in her father and sister.  Review of Systems  Constitutional: Negative for chills, diaphoresis and fever.  Eyes: Negative.   Respiratory: Negative for cough, hemoptysis, sputum production, shortness of breath and wheezing.   Cardiovascular: Negative for chest pain, orthopnea and leg swelling.  Gastrointestinal: Negative for abdominal pain, blood in stool, constipation, diarrhea, heartburn, melena, nausea and vomiting.  Genitourinary: Negative for flank pain.  Skin: Negative for rash.  Neurological: Negative for dizziness, sensory change, speech change, focal weakness and headaches.    The problem list and medications were reviewed and updated by myself where necessary and exist elsewhere in the encounter.   OBJECTIVE:  BP 140/80 (BP Location: Right Arm)   Pulse  84   Temp 98.6 F (37 C) (Oral)   Resp 16   Ht 5\' 7"  (1.702 m)   Wt 163 lb 9.6 oz (74.2 kg)   SpO2 98%   BMI 25.62 kg/m   Physical Exam  Constitutional: She is oriented to person, place, and time. She appears well-nourished. No distress.  Eyes: Pupils are equal, round, and reactive to light. EOM are normal.  Cardiovascular: Normal rate.  Pulmonary/Chest: Effort normal.  Abdominal: She exhibits no distension and no mass. There is no tenderness. There is no rebound and no guarding. No hernia.  Neurological: She is alert and oriented to person, place, and time. No cranial nerve deficit. Gait normal.  Skin: Skin is dry. She is not diaphoretic.  Psychiatric: She has a normal mood and affect.  Vitals reviewed.   No results found for this or any previous visit (from the past 72 hour(s)). Lab Results  Component Value Date   CREATININE 0.67 04/07/2017   BUN 9 04/07/2017   NA 141 04/07/2017   K 4.0 04/07/2017   CL 103 04/07/2017   CO2 29 04/07/2017    No results found.  ASSESSMENT AND PLAN:  Dewayne was seen today for back pain.  Diagnoses and all orders for this visit:  Frequency of urination: Her urine appears infected.  I will start her on Augmentin.  We will culture the urine however I do not have high hopes given she has been taking antibiotics already.  Of advised that she stop the previous antibiotic.  She can try some Flexeril in the event that this is a musculoskeletal etiology. -  POCT urinalysis dipstick -     POCT Microscopic Urinalysis ([facility]) -     POCT CBC -     Urine Culture  Flank pain -     amoxicillin-clavulanate (AUGMENTIN) 875-125 MG tablet; Take 1 tablet by mouth 2 (two) times daily. If you begin having diarrhea then please take imodium. -     cyclobenzaprine (FLEXERIL) 10 MG tablet; Take 0.5-1 tablets (5-10 mg total) by mouth 3 (three) times daily as needed for muscle spasms. Do not mix with narcotics. May cause drowsiness.    The patient is advised  to call or return to clinic if she does not see an improvement in symptoms, or to seek the care of the closest emergency department if she worsens with the above plan.   Philis Fendt, MHS, PA-C [facility] 11/24/2017 3:52 PM

## 2017-11-22 LAB — URINE CULTURE: Organism ID, Bacteria: NO GROWTH

## 2017-12-16 ENCOUNTER — Other Ambulatory Visit: Payer: Self-pay | Admitting: Urology

## 2017-12-16 DIAGNOSIS — N368 Other specified disorders of urethra: Secondary | ICD-10-CM

## 2017-12-23 ENCOUNTER — Ambulatory Visit (HOSPITAL_COMMUNITY)
Admission: RE | Admit: 2017-12-23 | Discharge: 2017-12-23 | Disposition: A | Discharge status: Home / Self Care | Payer: Medicare Other | Source: Ambulatory Visit | Attending: Urology | Admitting: Urology

## 2017-12-23 DIAGNOSIS — N368 Other specified disorders of urethra: Secondary | ICD-10-CM | POA: Insufficient documentation

## 2017-12-23 DIAGNOSIS — D259 Leiomyoma of uterus, unspecified: Secondary | ICD-10-CM | POA: Diagnosis not present

## 2017-12-23 LAB — POCT I-STAT CREATININE: CREATININE: 0.8 mg/dL (ref 0.44–1.00)

## 2017-12-23 MED ORDER — GADOBENATE DIMEGLUMINE 529 MG/ML IV SOLN
15.0000 mL | Freq: Once | INTRAVENOUS | Status: AC | PRN
  Administered 2017-12-23: 15 mL via INTRAVENOUS

## 2018-01-20 ENCOUNTER — Ambulatory Visit (INDEPENDENT_AMBULATORY_CARE_PROVIDER_SITE_OTHER): Payer: Medicare Other | Admitting: Internal Medicine

## 2018-01-20 ENCOUNTER — Encounter: Payer: Self-pay | Admitting: Internal Medicine

## 2018-01-20 VITALS — BP 158/88 | HR 56 | Temp 98.1°F | Wt 167.0 lb

## 2018-01-20 DIAGNOSIS — Z0000 Encounter for general adult medical examination without abnormal findings: Secondary | ICD-10-CM | POA: Diagnosis not present

## 2018-01-20 MED ORDER — LISINOPRIL 20 MG PO TABS: 10.0000 mg | ORAL_TABLET | Freq: Every day | ORAL | 3 refills | Status: DC

## 2018-01-20 NOTE — Patient Instructions (Addendum)
Limit your sodium (Salt) intake  Please check your blood pressure on a regular basis.  If it is consistently greater than 140/90, please make an office appointment.    It is important that you exercise regularly, at least 20 minutes 3 to 4 times per week.  If you develop chest pain or shortness of breath seek  medical attention.     DASH Eating Plan DASH stands for "Dietary Approaches to Stop Hypertension." The DASH eating plan is a healthy eating plan that has been shown to reduce high blood pressure (hypertension). It may also reduce your risk for type 2 diabetes, heart disease, and stroke. The DASH eating plan may also help with weight loss. What are tips for following this plan? General guidelines  Avoid eating more than 2,300 mg (milligrams) of salt (sodium) a day. If you have hypertension, you may need to reduce your sodium intake to 1,500 mg a day.  Limit alcohol intake to no more than 1 drink a day for nonpregnant women and 2 drinks a day for men. One drink equals 12 oz of beer, 5 oz of wine, or 1 oz of hard liquor.  Work with your health care provider to maintain a healthy body weight or to lose weight. Ask what an ideal weight is for you.  Get at least 30 minutes of exercise that causes your heart to beat faster (aerobic exercise) most days of the week. Activities may include walking, swimming, or biking.  Work with your health care provider or diet and nutrition specialist (dietitian) to adjust your eating plan to your individual calorie needs. Reading food labels  Check food labels for the amount of sodium per serving. Choose foods with less than 5 percent of the Daily Value of sodium. Generally, foods with less than 300 mg of sodium per serving fit into this eating plan.  To find whole grains, look for the word "whole" as the first word in the ingredient list. Shopping  Buy products labeled as "low-sodium" or "no salt added."  Buy fresh foods. Avoid canned foods and  premade or frozen meals. Cooking  Avoid adding salt when cooking. Use salt-free seasonings or herbs instead of table salt or sea salt. Check with your health care provider or pharmacist before using salt substitutes.  Do not fry foods. Cook foods using healthy methods such as baking, boiling, grilling, and broiling instead.  Cook with heart-healthy oils, such as olive, canola, soybean, or sunflower oil. Meal planning   Eat a balanced diet that includes: ? 5 or more servings of fruits and vegetables each day. At each meal, try to fill half of your plate with fruits and vegetables. ? Up to 6-8 servings of whole grains each day. ? Less than 6 oz of lean meat, poultry, or fish each day. A 3-oz serving of meat is about the same size as a deck of cards. One egg equals 1 oz. ? 2 servings of low-fat dairy each day. ? A serving of nuts, seeds, or beans 5 times each week. ? Heart-healthy fats. Healthy fats called Omega-3 fatty acids are found in foods such as flaxseeds and coldwater fish, like sardines, salmon, and mackerel.  Limit how much you eat of the following: ? Canned or prepackaged foods. ? Food that is high in trans fat, such as fried foods. ? Food that is high in saturated fat, such as fatty meat. ? Sweets, desserts, sugary drinks, and other foods with added sugar. ? Full-fat dairy products.  Do not  salt foods before eating.  Try to eat at least 2 vegetarian meals each week.  Eat more home-cooked food and less restaurant, buffet, and fast food.  When eating at a restaurant, ask that your food be prepared with less salt or no salt, if possible. What foods are recommended? The items listed may not be a complete list. Talk with your dietitian about what dietary choices are best for you. Grains Whole-grain or whole-wheat bread. Whole-grain or whole-wheat pasta. Brown rice. Modena Morrow. Bulgur. Whole-grain and low-sodium cereals. Pita bread. Low-fat, low-sodium crackers.  Whole-wheat flour tortillas. Vegetables Fresh or frozen vegetables (raw, steamed, roasted, or grilled). Low-sodium or reduced-sodium tomato and vegetable juice. Low-sodium or reduced-sodium tomato sauce and tomato paste. Low-sodium or reduced-sodium canned vegetables. Fruits All fresh, dried, or frozen fruit. Canned fruit in natural juice (without added sugar). Meat and other protein foods Skinless chicken or [place]. Ground chicken or [place]. Pork with fat trimmed off. Fish and seafood. Egg whites. Dried beans, peas, or lentils. Unsalted nuts, nut butters, and seeds. Unsalted canned beans. Lean cuts of beef with fat trimmed off. Low-sodium, lean deli meat. Dairy Low-fat (1%) or fat-free (skim) milk. Fat-free, low-fat, or reduced-fat cheeses. Nonfat, low-sodium ricotta or cottage cheese. Low-fat or nonfat yogurt. Low-fat, low-sodium cheese. Fats and oils Soft margarine without trans fats. Vegetable oil. Low-fat, reduced-fat, or light mayonnaise and salad dressings (reduced-sodium). Canola, safflower, olive, soybean, and sunflower oils. Avocado. Seasoning and other foods Herbs. Spices. Seasoning mixes without salt. Unsalted popcorn and pretzels. Fat-free sweets. What foods are not recommended? The items listed may not be a complete list. Talk with your dietitian about what dietary choices are best for you. Grains Baked goods made with fat, such as croissants, muffins, or some breads. Dry pasta or rice meal packs. Vegetables Creamed or fried vegetables. Vegetables in a cheese sauce. Regular canned vegetables (not low-sodium or reduced-sodium). Regular canned tomato sauce and paste (not low-sodium or reduced-sodium). Regular tomato and vegetable juice (not low-sodium or reduced-sodium). Angie Fava. Olives. Fruits Canned fruit in a light or heavy syrup. Fried fruit. Fruit in cream or butter sauce. Meat and other protein foods Fatty cuts of meat. Ribs. Fried meat. Berniece Salines. Sausage. Bologna and other  processed lunch meats. Salami. Fatback. Hotdogs. Bratwurst. Salted nuts and seeds. Canned beans with added salt. Canned or smoked fish. Whole eggs or egg yolks. Chicken or [place] with skin. Dairy Whole or 2% milk, cream, and half-and-half. Whole or full-fat cream cheese. Whole-fat or sweetened yogurt. Full-fat cheese. Nondairy creamers. Whipped toppings. Processed cheese and cheese spreads. Fats and oils Butter. Stick margarine. Lard. Shortening. Ghee. Bacon fat. Tropical oils, such as coconut, palm kernel, or palm oil. Seasoning and other foods Salted popcorn and pretzels. Onion salt, garlic salt, seasoned salt, table salt, and sea salt. Worcestershire sauce. Tartar sauce. Barbecue sauce. Teriyaki sauce. Soy sauce, including reduced-sodium. Steak sauce. Canned and packaged gravies. Fish sauce. Oyster sauce. Cocktail sauce. Horseradish that you find on the shelf. Ketchup. Mustard. Meat flavorings and tenderizers. Bouillon cubes. Hot sauce and Tabasco sauce. Premade or packaged marinades. Premade or packaged taco seasonings. Relishes. Regular salad dressings. Where to find more information:  National Heart, Lung, and [place]: https://wilson-eaton.com/  American Heart Association: www.heart.org Summary  The DASH eating plan is a healthy eating plan that has been shown to reduce high blood pressure (hypertension). It may also reduce your risk for type 2 diabetes, heart disease, and stroke.  With the DASH eating plan, you should limit salt (sodium) intake to  2,300 mg a day. If you have hypertension, you may need to reduce your sodium intake to 1,500 mg a day.  When on the DASH eating plan, aim to eat more fresh fruits and vegetables, whole grains, lean proteins, low-fat dairy, and heart-healthy fats.  Work with your health care provider or diet and nutrition specialist (dietitian) to adjust your eating plan to your individual calorie needs. This information is not intended to replace advice given to  you by your health care provider. Make sure you discuss any questions you have with your health care provider. Document Released: 07/10/2011 Document Revised: 07/14/2016 Document Reviewed: 07/14/2016 Elsevier Interactive Patient Education  [employer].

## 2018-01-20 NOTE — Progress Notes (Signed)
Subjective:    Patient ID: Christine [place], female    DOB: 02-Mar-1951, 67 y.o.   MRN: [id number]  HPI  67 year old patient who is seen today for a annual preventive health examination.  She has a history of hypertension and does quite well.  More recently has had some issues with recurrent UTIs as well as incontinence and anticipates urological surgery later this summer. She has a history of colonic polyps.  Last colonoscopy was August 2016 Presently has been resumed on antibiotic therapy for a recurrent UTI.  She has essential hypertension.  Very infrequent home blood pressure monitoring.  Social history.  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 Hypertension   . WPW (Wolff-Parkinson-White syndrome)      Social History   Socioeconomic History  . Marital status: Married    Spouse name: Not on file  . Number of children: Not on file  . Years of education: Not on file  . Highest education level: Not on file  Occupational History  . Not on file  Social Needs  . Financial resource strain: Not on file  . Food insecurity:    Worry: Not on file    Inability: Not on file  . Transportation needs:    Medical: Not on file    Non-medical: Not on file  Tobacco Use  . Smoking status: Former Smoker    Last attempt to quit: 10/28/1980    Years since quitting: 37.2  . Smokeless tobacco: Never Used  Substance and Sexual Activity  . Alcohol use: Yes    Alcohol/week: 0.0 oz    Comment: occ  . Drug use: Not on file  . Sexual activity: Not on file  Lifestyle  . Physical activity:    Days per week: Not on file    Minutes per session: Not on file  . Stress: Not on file  Relationships  . Social connections:    Talks on phone: Not on file    Gets together: Not on file    Attends religious service: Not on file    Active member of club or organization: Not on file    Attends meetings of clubs or  organizations: Not on file    Relationship status: Not on file  . Intimate partner violence:    Fear of current or ex partner: Not on file    Emotionally abused: Not on file    Physically abused: Not on file    Forced sexual activity: Not on file  Other Topics Concern  . Not on file  Social History Narrative  . Not on file    Past Surgical History:  Procedure Laterality Date  . laparoscopic drainage of ovarian cyst      Family History  Problem Relation Age of Onset  . Heart disease Mother   . Heart failure Mother   . Hypertension Father   . Breast cancer Paternal Aunt   . Diabetes Sister   . Hypertension Sister     Allergies  Allergen Reactions  . Bactrim [Sulfamethoxazole-Trimethoprim]     REACTION: leg cramps & rash  . Cephalexin Diarrhea    Current Outpatient Medications on File Prior to Visit  Medication Sig Dispense Refill  . Calcium Carb-Cholecalciferol (CALCIUM-VITAMIN D) 500-200 MG-UNIT tablet Take by mouth.    . cyclobenzaprine (FLEXERIL) 10 MG tablet Take 0.5-1 tablets (5-10 mg total) by mouth 3 (three) times daily as needed  for muscle spasms. Do not mix with narcotics. May cause drowsiness. 15 tablet 0  . nitrofurantoin (MACRODANTIN) 100 MG capsule     . Wheat Dextrin (BENEFIBER DRINK MIX PO) Take 2 Doses by mouth every morning.     No current facility-administered medications on file prior to visit.     BP (!) 158/88 (BP Location: Right Arm, Patient Position: Sitting, Cuff Size: Large)   Pulse (!) 56   Temp 98.1 F (36.7 C) (Oral)   Wt 167 lb (75.8 kg)   SpO2 100%   BMI 26.16 kg/m     Review of Systems  Constitutional: Negative.   HENT: Negative for congestion, dental problem, hearing loss, rhinorrhea, sinus pressure, sore throat and tinnitus.   Eyes: Negative for pain, discharge and visual disturbance.  Respiratory: Negative for cough and shortness of breath.   Cardiovascular: Negative for chest pain, palpitations and leg swelling.    Gastrointestinal: Negative for abdominal distention, abdominal pain, blood in stool, constipation, diarrhea, nausea and vomiting.  Genitourinary: Negative for difficulty urinating, dysuria, flank pain, frequency, hematuria, pelvic pain, urgency, vaginal bleeding, vaginal discharge and vaginal pain.  Musculoskeletal: Negative for arthralgias, gait problem and joint swelling.  Skin: Negative for rash.  Neurological: Negative for dizziness, syncope, speech difficulty, weakness, numbness and headaches.  Hematological: Negative for adenopathy.  Psychiatric/Behavioral: Negative for agitation, behavioral problems and dysphoric mood. The patient is not nervous/anxious.        Objective:   Physical Exam  Constitutional: She is oriented to person, place, and time. She appears well-developed and well-nourished.  Blood pressure 144/90  HENT:  Head: Normocephalic.  Right Ear: External ear normal.  Left Ear: External ear normal.  Mouth/Throat: Oropharynx is clear and moist.  Mild pharyngeal crowding  Eyes: Pupils are equal, round, and reactive to light. Conjunctivae and EOM are normal.  Neck: Normal range of motion. Neck supple. No thyromegaly present.  Cardiovascular: Normal rate, regular rhythm, normal heart sounds and intact distal pulses.  Pedal pulses full  Pulmonary/Chest: Effort normal and breath sounds normal.  Abdominal: Soft. Bowel sounds are normal. She exhibits no mass. There is no tenderness.  Musculoskeletal: Normal range of motion.  Lymphadenopathy:    She has no cervical adenopathy.  Neurological: She is alert and oriented to person, place, and time.  Skin: Skin is warm and dry. No rash noted.  Psychiatric: She has a normal mood and affect. Her behavior is normal.          Assessment & Plan:  Preventive health examination  Essential hypertension.  Blood pressure bit high today.  Will increase lisinopril to 20 mg daily home blood pressure monitoring encouraged.  Will place  on a DASH diet.  Patient has been asked to return if blood pressures consistently greater than 130/90 History of colonic polyps .  Last colonoscopy August 2016  Marletta Lor

## 2018-02-05 ENCOUNTER — Other Ambulatory Visit: Payer: Self-pay

## 2018-02-05 ENCOUNTER — Telehealth: Payer: Self-pay | Admitting: Internal Medicine

## 2018-02-05 MED ORDER — LISINOPRIL 20 MG PO TABS: ORAL_TABLET | ORAL | 3 refills | Status: DC

## 2018-02-05 NOTE — Telephone Encounter (Signed)
Spoke to pt Rx change to 1 tablet daily.

## 2018-02-05 NOTE — Telephone Encounter (Signed)
Copied from [place] [id number]. Topic: Quick Communication - See Telephone Encounter >> Feb 05, 2018  9:36 AM Rutherford Nail, NT wrote: CRM for notification. See Telephone encounter for: 02/05/18. Patient calling and states that she thinks the lisinopril (PRINIVIL,ZESTRIL) 20 MG tablet instructions are wrong. States that Dr Raliegh Ip told her at her appointment that he would send a new prescription of 20mg  and she would take it 1 time a day. Please advise. Patient would like a call back to verify what she should be taking. CB#: [phone]

## 2018-03-11 ENCOUNTER — Telehealth: Payer: Self-pay | Admitting: Internal Medicine

## 2018-03-11 ENCOUNTER — Other Ambulatory Visit: Payer: Self-pay | Admitting: Urology

## 2018-03-11 NOTE — Telephone Encounter (Signed)
Copied from [place] [id number]. Topic: Quick Communication - See Telephone Encounter >> Mar 11, 2018  3:52 PM Sheran Luz wrote: CRM for notification. See Telephone encounter for: 03/11/18.  Heather called from Dr. Grant Fontana office and stated that pt needed surgical clearance for hysterectomy . 8/1 pts blood pressure was 148/84 and later that day it was 150/86. Please fax surgical clearance letter to [phone]

## 2018-03-12 NOTE — Telephone Encounter (Signed)
Okay for surgical clearance letter? Please advise

## 2018-03-15 ENCOUNTER — Encounter: Payer: Self-pay | Admitting: Internal Medicine

## 2018-03-16 NOTE — Telephone Encounter (Signed)
Letter faxed to Dr.Ledger office.

## 2018-03-24 ENCOUNTER — Other Ambulatory Visit: Payer: Self-pay

## 2018-03-25 ENCOUNTER — Other Ambulatory Visit: Payer: Self-pay

## 2018-03-25 MED ORDER — LISINOPRIL 20 MG PO TABS: ORAL_TABLET | ORAL | 3 refills | Status: DC

## 2018-03-25 NOTE — Telephone Encounter (Signed)
Rx fixed. No further action needed. Pt notified

## 2018-03-25 NOTE — Telephone Encounter (Signed)
I have called [facility] on [address] and they have RX from 01/18/18 for pt to take lisinopril 20mg  1/2 tablet per day. Per notes and pt she was instructed to increase to 1 full tablet per day on 02/05/18. Pt is needing to pick up RX today but [facility] cannot refill until 04/12/18 based on the script they currently have. RX 02/05/18 does not appear to have been sent thru to pharmacy. Pt is requesting it be sent today and call her to notify when sent.  Call back [place], [place] - [address] [phone] (Phone) [phone] (Fax)

## 2018-04-12 ENCOUNTER — Encounter: Payer: Medicare Other | Admitting: Internal Medicine

## 2018-04-21 NOTE — H&P (Addendum)
Christine [place] is an 67 y.o. female. W/anterior prolapse and UI, currently using a pessary presenting for scheduled surgery.    Pertinent Gynecological History: Menses: post-menopausal Bleeding: n/a Contraception: post menopausal status DES exposure: denies Blood transfusions: none Sexually transmitted diseases: no past history Previous GYN Procedures: n/a  Last mammogram: normal Date: 2018 Last pap: normal Date: 2016 OB History: G1, P1   Menstrual History: No LMP recorded. Patient is postmenopausal.    Past Medical History:  Diagnosis Date  . Hypertension   . WPW (Wolff-Parkinson-White syndrome)   Diverticulosis  Past Surgical History:  Procedure Laterality Date  . laparoscopic drainage of ovarian cyst      Family History  Problem Relation Age of Onset  . Heart disease Mother   . Heart failure Mother   . Hypertension Father   . Breast cancer Paternal Aunt   . Diabetes Sister   . Hypertension Sister     Social History:  reports that she quit smoking about 37 years ago. She has never used smokeless tobacco. She reports that she drinks alcohol. Her drug history is not on file.  Allergies:  Allergies  Allergen Reactions  . Bactrim [Sulfamethoxazole-Trimethoprim]     REACTION: leg cramps & rash  . Cephalexin Diarrhea    No medications prior to admission.   Lisinopril 20mg  qd  ROS  There were no vitals taken for this visit. Physical Exam  Gen: well appearing, NAD CV: Reg rate Pulm: NWOB Abd: soft, nondistended, nontender, no masses XYB:FXOVA 2 ant prolapse, no cervical/uterine prolapse or post prolapse, small uterus Ext: No edema b/l  TVUS:  Uterus 6.3 x 3.7 x 4.8cm. 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 B/l ovaries seen  No results found for this or any previous visit (from the past 24 hour(s)).  No results found.  Assessment/Plan: 41 w/h/o anterior POP and SUI presenting for planned surgery. She was counseled regarding alternatives and after careful  discussion and exhaustion of more conservative options (including pessary), she opts surgical correction. Plan for LAVH BSO, anterior repair, modified mccalls, and cystoscopy. Dr. Matilde Sprang plans a sling that will follow my portion. Risks discussed including infection, bleeding, damage to surrounding structures, need for additional procedures, recurrent prolapse, risk of DVT, risk of blood transfusion, and possibility of needing more surgeries in the future. All questions answered. Consent signed. Proceed with above surgery.    Christine Garfinkel MD   Christine [place] 04/21/2018, 2:04 PM   No updates to above H&P. Patient arrived NPO and was consented in PACU. Risks discussed including infection, bleeding, damage to surrounding structures, and the need for additional procedures. All questions answered. Consent signed. Proceed with above surgery.   Christine Garfinkel MD

## 2018-04-30 NOTE — Patient Instructions (Addendum)
Your procedure is scheduled on:  Tuesday, Oct 8  Enter through the Main Entrance of [facility] at: 6 am  Pick up the phone at the desk and dial [phone].  Call this number if you have problems the morning of surgery: [phone].  Remember: Do NOT eat food or Do NOT drink clear liquids (including water) after Monday  Take these medicines the morning of surgery with a SIP OF WATER: None  Brush your teeth on the day of surgery.  Stop herbal medications, vitamin supplements, Ibuprofen/NSAIDS at this time.  Do NOT wear jewelry (body piercing), metal hair clips/bobby pins, make-up, or nail polish. Do NOT wear lotions, powders, or perfumes.  You may wear deoderant. Do NOT shave for 48 hours prior to surgery. Do NOT bring valuables to the hospital.  Leave suitcase in car.  After surgery it may be brought to your room.  For patients admitted to the hospital, checkout time is 11:00 AM the day of discharge. Home with Husband Coralyn Mark.

## 2018-05-05 ENCOUNTER — Encounter (HOSPITAL_COMMUNITY)
Admission: RE | Admit: 2018-05-05 | Discharge: 2018-05-05 | Disposition: A | Discharge status: Home / Self Care | Payer: Medicare Other | Source: Ambulatory Visit | Attending: Obstetrics and Gynecology | Admitting: Obstetrics and Gynecology

## 2018-05-05 ENCOUNTER — Other Ambulatory Visit: Payer: Self-pay

## 2018-05-05 ENCOUNTER — Encounter (HOSPITAL_COMMUNITY): Payer: Self-pay

## 2018-05-05 DIAGNOSIS — Z01812 Encounter for preprocedural laboratory examination: Secondary | ICD-10-CM | POA: Insufficient documentation

## 2018-05-05 HISTORY — DX: Unspecified hemorrhoids: K64.9

## 2018-05-05 HISTORY — DX: Constipation, unspecified: K59.00

## 2018-05-05 LAB — BASIC METABOLIC PANEL
Anion gap: 10 (ref 5–15)
BUN: 11 mg/dL (ref 8–23)
CHLORIDE: 103 mmol/L (ref 98–111)
CO2: 27 mmol/L (ref 22–32)
CREATININE: 0.73 mg/dL (ref 0.44–1.00)
Calcium: 9.4 mg/dL (ref 8.9–10.3)
GFR calc Af Amer: >60 mL/min (ref >60)
Glucose, Bld: 95 mg/dL (ref 70–99)
Potassium: 3.9 mmol/L (ref 3.5–5.1)
SODIUM: 140 mmol/L (ref 135–145)

## 2018-05-05 LAB — CBC
HCT: 44 % (ref 36.0–46.0)
HEMOGLOBIN: 14.8 g/dL (ref 12.0–15.0)
MCH: 29.4 pg (ref 26.0–34.0)
MCHC: 33.6 g/dL (ref 30.0–36.0)
MCV: 87.5 fL (ref 78.0–100.0)
Platelets: 235 10*3/uL (ref 150–400)
RBC: 5.03 MIL/uL (ref 3.87–5.11)
RDW: 13 % (ref 11.5–15.5)
WBC: 5.1 10*3/uL (ref 4.0–10.5)

## 2018-05-05 LAB — TYPE AND SCREEN
ABO/RH(D): O POS
ANTIBODY SCREEN: NEGATIVE

## 2018-05-05 LAB — ABO/RH: ABO/RH(D): O POS

## 2018-05-05 NOTE — H&P (Signed)
I was consulted by the above provider to assess the patient's urinary incontinence. She sometimes leaks with coughing sneezing and bending. She sometimes has urgency incontinence. She denies bedwetting. She wears 1 or 2 pads a day that are damp. She is planning a hysterectomy for prolapse. She does feel vaginal bulging but does not do any reduction or splinting maneuvers. She has fibroids. There was question whether not she had a urethral diverticulum but the MRI was normal   She voids every 2 or 3 hours and gets up once a night reports a good flow   She gets 2 bladder infections a year with right flank pain and sometimes fever and frequency that respond favorably antibiotics.   The patient has mild mixed incontinence and symptomatic prolapse. She is able to remove her Pessary and reinserted. She has mild frequency and nocturia. She has recurrent bladder infections with right flank discomfort. The role of urodynamics and renal ultrasound noted. I would like her to take her pessary out the day before her urodynamics and I would perform the study without and I would examine her on the same day. I will call the urine cultures positive   Today  Frequency and incontinence are stable  on urodynamics the patient did not void and was catheterized for 75 mL. Maximum bladder capacity was 999 mL. Bladder was hypo sensitive. The bladder was stable. She did not leak prior to 600 mL. At 600 mL she had mild leakage at 101 cm of water. At 800 mL she had mild to moderate leakage at a pressure of 71 cm of water. During voluntary voiding she had trouble to initiate urination. She did void 704 mL with a maximum flow of approximately 20 mL/sec with some artifact. Maximum voiding pressure was 12 cm of water. Residual was 288 mL. Bladder neck descended about 2 or 3 cm with funneling from assistant moderate cystocele EMG activity was noted and the details of the urodynamics are sign-in dictated  Residual on 1st visit was 16 mL    The patient has a large capacity bladder that does not contract well. The pros cons and risks of a sling was discussed. She is at higher risk of retention and this was discussed.   pelvic examination: The patient had a large grade 2 cystocele with moderate central defect with rotational descent nearly reaching the introitus. She had grade 2 hypermobility the bladder neck with a negative cough test after cystoscopy with the cystocele reduced. Her uterus actually only descended from 8 or 9 cm about 2 cm. She had good vaginal length. With the prolapse reduced she had a small grade 1 rectocele   when I look that the referral notes it clearly noted that I was to assess the urethral component. I will call her gynecologist who I am assuming we will be doing the hysterectomy and prolapse part of the surgery. I will send a note to Dr. Royston Sinner   The patient might choose watchful waiting but is leaning towards surgery. Persistent and worsening incontinence rates with watchful waiting discussed      ALLERGIES: Bactrim TABS - Skin Rash Cephalexin - Diarrhea    MEDICATIONS: Lisinopril 10 mg tablet  Benefiber  Citracal     GU PSH: Locm 300-399Mg /Ml Iodine,1Ml - 01/28/2018      PSH Notes: laparoscopy 1994 to remove ovarian cyst   NON-GU PSH: None   GU PMH: Mixed incontinence - 01/15/2018 Nocturia - 01/15/2018 Urinary Frequency - 01/15/2018 Urinary Tract Inf, Unspec site -  01/15/2018 Other female genital prolapse - 12/15/2017 Stress Incontinence - 12/15/2017 Urethral Disorders Other - 12/15/2017 Hemorrhoids, Unspec      PMH Notes: WPW syndrome since 2004   NON-GU PMH: Arrhythmia Hypertension    FAMILY HISTORY: Diabetes - Sister, Grandmother heart failure - Mother Tuberculosis - Grandfather   SOCIAL HISTORY: Marital Status: Married Preferred Language: English; Race: White Current Smoking Status: Patient does not smoke anymore. Has not smoked since 12/03/1987. Smoked for 10 years.   Tobacco  Use Assessment Completed: Used Tobacco in last 30 days? Has never drank.  Drinks 1 caffeinated drink per day. 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     Notes: 1 son   REVIEW OF SYSTEMS:    GU Review Female:   Patient denies frequent urination, hard to postpone urination, burning /pain with urination, get up at night to urinate, leakage of urine, stream starts and stops, trouble starting your stream, have to strain to urinate, and being pregnant.  Gastrointestinal (Upper):   Patient denies nausea, vomiting, and indigestion/ heartburn.  Gastrointestinal (Lower):   Patient denies diarrhea and constipation.  Constitutional:   Patient denies fever, night sweats, weight loss, and fatigue.  Skin:   Patient denies skin rash/ lesion and itching.  Eyes:   Patient denies blurred vision and double vision.  Ears/ Nose/ Throat:   Patient denies sore throat and sinus problems.  Hematologic/Lymphatic:   Patient denies swollen glands and easy bruising.  Cardiovascular:   Patient denies leg swelling and chest pains.  Respiratory:   Patient denies cough and shortness of breath.  Endocrine:   Patient denies excessive thirst.  Musculoskeletal:   Patient denies back pain and joint pain.  Neurological:   Patient denies headaches and dizziness.  Psychologic:   Patient denies depression and anxiety.   VITAL SIGNS:      02/23/2018 02:42 PM  BP 155/90 mmHg  Pulse 66 /min  Temperature 97.9 F / 36.6 C   PAST DATA REVIEWED:  Source Of History:  Patient   PROCEDURES:         Flexible Cystoscopy - 52000  Risks, benefits, and some of the potential complications of the procedure were discussed at length with the patient including infection, bleeding, voiding discomfort, urinary retention, fever, chills, sepsis, and others. All questions were answered. Informed consent was obtained. Sterile technique and intraurethral analgesia were used.  Ureteral Orifices:  Normal location. Normal size. Normal shape.  Effluxed clear urine.  Bladder:  No trabeculation. No tumors. Normal mucosa. No stones. Urine cloudy and sent for culture      The lower urinary tract was carefully examined. There was no stitch, foreign, body, or carcinoma. The procedure was well-tolerated and without complications. Antibiotic instructions were given. Instructions were given to call the office immediately for bloody urine, difficulty urinating, urinary retention, painful or frequent urination, fever, chills, nausea, vomiting or other illness. The patient stated that she understood these instructions and would comply with them.         Urodynamics - [id number], [id number], [id number], [id number], [id number] URODYNAMICS STUDY  Test Indication: Incontinence, Prolapse The procedure's risks, benefits and infection risk were discussed with the patient.  PRE UROFLOW & CATHETERIZATION Procedure: Pre Uroflow Study The patient did not void. No urge to void.  A 14 FR straight catheter was inserted and 75 mls was obtained. A Urodynamic catheter was inserted. Urine dipstick was negative for WBCs and bacteria and UA showed no signs of UTI.  CYSTOMETRY/CYSTOMETROGRAM The bladder was filled with room temperature water  at a rate of less than 50 cc per minute. Injection of contrast was performed for the cystometrogram. Max capacity was approx. 999 mls. First sensation occurred at 211 mls. Normal desire occurred at approx. 499 mls. Strong desire at 814 mls.  The bladder was stable.   LEAK POINT PRESSURE LPPs were assessed with pt in a seated position. Both x-ray and visualization were used to assess for SUI. No leakage was noted prior to 600 mls.  All VLPPs below are with the prolapse reduced.  VLPP at 600 mls was 101 with mild leakage. VLPP at 700 mls was 92 cmH20 with mild leakage. VLPP at 800 mls was 71 cmH20 with mild to moderate leakage. (She did not leak with coughing pressures up to 79 cmH20).  PRESSURE FLOW STUDY She was coached to void without  straining and had trouble doing so (usually strains to assist voiding). She able to generate a voluntary contraction but initially had some trouble getting her flow started. The UD line was expelled during her void.  Volume voided approx. 704 mls. Max flow was 13-20 ml/s. (there was some artifact when the flow meter was moved during voiding) Max detrusor pressure was 12 cmH20. PVR was approx. 288 mls.  ELECTROMYOGRAM Activity was measured by surface electrodes EMG leads were not tracing well during her actual void.  FLUOROSCOPY and VCUG During cough and Valsalva, the bladder descended more than 3 cm. No reflux was seen.  POST PROCEDURE ANTIBIOTICS: Cipro 500 mg po given post UDS.  NURSE IMPRESSION Ms. Perine held a max capacity of approx. 999 mls. She was not at all uncomfortably full at this volume. Her 1st sensation was felt at 211 mls. Her normal desire was not felt until reaching 499 mls. There was positive SUI at volumes of 600 mls and greater (with cystocele reduced). No instability was noted. She was coached to void without straining which was hard for her. It took some time for her to void. The packing had to be removed for her to void. She did generate a voluntary contraction up 13 cmH20, however, the UD line was expelled during voiding. There was some artifact noted in recording of her flow due to the flow meter being moved. EMG leads were not tracing well during voiding, but she had some interruption in her flow pattern. PVR approx. 288 mls. No reflux was seen.          Urinalysis w/Scope Dipstick Dipstick Cont'd Micro  Color: Yellow Bilirubin: Neg mg/dL WBC/hpf: 0 - 5/hpf  Appearance: Clear Ketones: Neg mg/dL RBC/hpf: 0 - 2/hpf  Specific Gravity: 1.025 Blood: Trace ery/uL Bacteria: NS (Not Seen)  pH: <=5.0 Protein: Neg mg/dL Cystals: NS (Not Seen)  Glucose: Neg mg/dL Urobilinogen: 0.2 mg/dL Casts: NS (Not Seen)    Nitrites: Neg Trichomonas: Not Present    Leukocyte Esterase:  Neg leu/uL Mucous: Not Present      Epithelial Cells: NS (Not Seen)      Yeast: NS (Not Seen)      Sperm: Not Present    ASSESSMENT:      ICD-10 Details  1 GU:   Stress Incontinence - N39.3   2   Nocturia - R35.1               Notes:   We talked about a sling in detail. Pros, cons, general surgical and anesthetic risks, and other options including behavioral therapy and watchful waiting were discussed. She understands that slings are generally successful in 90% of  cases for stress incontinence, 50% for urge incontinence, and that in a small % of cases the incontinence can worsen. The risk of persistent, de novo, or worsening incontinence/dysfunction was discussed. Risks were described but not limited to the discussion of injury to neighboring structures including the bowel (with possible life-threatening sepsis and colostomy), bladder, urethra, vagina (all resulting in further surgery), and ureter (resulting in re-implantation). We also talked about the risk of retention requiring urethrolysis, extrusion requiring revision, and erosion resulting in further surgery. Bleeding risks and transfusion rates and the risk of infection were discussed. The risk of pelvic and abdominal pain syndromes, dyspareunia, and neuropathies were discussed. The need for CIC was described as well the usual post-operative course. The patient understands that she might not reach her treatment goal and that she might be worse following surgery.   After a thorough review of the management options for the patient's condition the patient  elected to proceed with surgical therapy as noted above. We have discussed the potential benefits and risks of the procedure, side effects of the proposed treatment, the likelihood of the patient achieving the goals of the procedure, and any potential problems that might occur during the procedure or recuperation. Informed consent has been obtained.

## 2018-05-05 NOTE — Pre-Procedure Instructions (Signed)
Reviewed patient's medical hx/medications and EKG with Dr. Royce Macadamia.  No orders given.  [place] for surgery.  Surgical clearance in Epic dated 03/11/18 by Dr Bluford Kaufmann.

## 2018-05-11 ENCOUNTER — Ambulatory Visit (HOSPITAL_COMMUNITY): Payer: Medicare Other | Admitting: Anesthesiology

## 2018-05-11 ENCOUNTER — Observation Stay (HOSPITAL_COMMUNITY)
Admission: RE | Admit: 2018-05-11 | Discharge: 2018-05-12 | Disposition: A | Discharge status: Home / Self Care | Payer: Medicare Other | Source: Ambulatory Visit | Attending: Obstetrics and Gynecology | Admitting: Obstetrics and Gynecology

## 2018-05-11 ENCOUNTER — Encounter (HOSPITAL_COMMUNITY): Payer: Self-pay

## 2018-05-11 ENCOUNTER — Other Ambulatory Visit: Payer: Self-pay

## 2018-05-11 ENCOUNTER — Encounter (HOSPITAL_COMMUNITY)
Admission: RE | Disposition: A | Discharge status: Home / Self Care | Payer: Self-pay | Source: Ambulatory Visit | Attending: Obstetrics and Gynecology

## 2018-05-11 DIAGNOSIS — Z87891 Personal history of nicotine dependence: Secondary | ICD-10-CM | POA: Insufficient documentation

## 2018-05-11 DIAGNOSIS — N888 Other specified noninflammatory disorders of cervix uteri: Secondary | ICD-10-CM | POA: Diagnosis not present

## 2018-05-11 DIAGNOSIS — Z79899 Other long term (current) drug therapy: Secondary | ICD-10-CM | POA: Insufficient documentation

## 2018-05-11 DIAGNOSIS — N393 Stress incontinence (female) (male): Principal | ICD-10-CM | POA: Insufficient documentation

## 2018-05-11 DIAGNOSIS — I10 Essential (primary) hypertension: Secondary | ICD-10-CM | POA: Insufficient documentation

## 2018-05-11 DIAGNOSIS — R351 Nocturia: Secondary | ICD-10-CM | POA: Diagnosis not present

## 2018-05-11 DIAGNOSIS — Z90710 Acquired absence of both cervix and uterus: Secondary | ICD-10-CM | POA: Diagnosis present

## 2018-05-11 DIAGNOSIS — N8110 Cystocele, unspecified: Secondary | ICD-10-CM | POA: Diagnosis not present

## 2018-05-11 DIAGNOSIS — D259 Leiomyoma of uterus, unspecified: Secondary | ICD-10-CM | POA: Diagnosis not present

## 2018-05-11 DIAGNOSIS — Z90721 Acquired absence of ovaries, unilateral: Secondary | ICD-10-CM

## 2018-05-11 HISTORY — PX: PUBOVAGINAL SLING: SHX1035

## 2018-05-11 HISTORY — PX: CYSTOSCOPY: SHX5120

## 2018-05-11 HISTORY — PX: ANTERIOR AND POSTERIOR REPAIR: SHX5121

## 2018-05-11 HISTORY — PX: LAPAROSCOPIC VAGINAL HYSTERECTOMY WITH SALPINGO OOPHORECTOMY: SHX6681

## 2018-05-11 SURGERY — HYSTERECTOMY, VAGINAL, LAPAROSCOPY-ASSISTED, WITH SALPINGO-OOPHORECTOMY
Anesthesia: General

## 2018-05-11 MED ORDER — FENTANYL CITRATE (PF) 250 MCG/5ML IJ SOLN
INTRAMUSCULAR | Status: AC
  Filled 2018-05-11: qty 5

## 2018-05-11 MED ORDER — MIDAZOLAM HCL 2 MG/2ML IJ SOLN
INTRAMUSCULAR | Status: DC | PRN
  Administered 2018-05-11 (×2): 0.5 mg via INTRAVENOUS

## 2018-05-11 MED ORDER — ONDANSETRON HCL 4 MG/2ML IJ SOLN
INTRAMUSCULAR | Status: AC
  Filled 2018-05-11: qty 2

## 2018-05-11 MED ORDER — ACETAMINOPHEN 500 MG PO TABS
ORAL_TABLET | ORAL | Status: AC
  Administered 2018-05-11: 1000 mg via ORAL
  Filled 2018-05-11: qty 2

## 2018-05-11 MED ORDER — LIDOCAINE-EPINEPHRINE 0.5 %-1:200000 IJ SOLN
INTRAMUSCULAR | Status: AC
  Filled 2018-05-11: qty 1

## 2018-05-11 MED ORDER — ACETAMINOPHEN 500 MG PO TABS
1000.0000 mg | ORAL_TABLET | Freq: Once | ORAL | Status: AC
  Administered 2018-05-11: 1000 mg via ORAL

## 2018-05-11 MED ORDER — FLUORESCEIN SODIUM 10 % IV SOLN
INTRAVENOUS | Status: AC
  Filled 2018-05-11: qty 5

## 2018-05-11 MED ORDER — ROCURONIUM BROMIDE 100 MG/10ML IV SOLN
INTRAVENOUS | Status: AC
  Filled 2018-05-11: qty 1

## 2018-05-11 MED ORDER — BUPIVACAINE-EPINEPHRINE (PF) 0.5% -1:200000 IJ SOLN
INTRAMUSCULAR | Status: AC
  Filled 2018-05-11: qty 30

## 2018-05-11 MED ORDER — BUPIVACAINE-EPINEPHRINE 0.25% -1:200000 IJ SOLN
INTRAMUSCULAR | Status: DC | PRN
  Administered 2018-05-11: 3 mL

## 2018-05-11 MED ORDER — SODIUM CHLORIDE 0.9 % IR SOLN
Status: DC | PRN
  Administered 2018-05-11: 1000 mL

## 2018-05-11 MED ORDER — GABAPENTIN 300 MG PO CAPS
300.0000 mg | ORAL_CAPSULE | Freq: Once | ORAL | Status: AC
  Administered 2018-05-11: 300 mg via ORAL

## 2018-05-11 MED ORDER — BUPIVACAINE HCL (PF) 0.25 % IJ SOLN
INTRAMUSCULAR | Status: AC
  Filled 2018-05-11: qty 30

## 2018-05-11 MED ORDER — FENTANYL CITRATE (PF) 100 MCG/2ML IJ SOLN
INTRAMUSCULAR | Status: DC | PRN
  Administered 2018-05-11 (×5): 50 ug via INTRAVENOUS

## 2018-05-11 MED ORDER — FENTANYL CITRATE (PF) 100 MCG/2ML IJ SOLN: 25.0000 ug | INTRAMUSCULAR | Status: DC | PRN

## 2018-05-11 MED ORDER — ESTRADIOL 0.1 MG/GM VA CREA
TOPICAL_CREAM | VAGINAL | Status: AC
  Filled 2018-05-11: qty 42.5

## 2018-05-11 MED ORDER — GENTAMICIN SULFATE 40 MG/ML IJ SOLN
5.0000 mg/kg | INTRAVENOUS | Status: AC
  Administered 2018-05-11: 380 mg via INTRAVENOUS
  Filled 2018-05-11: qty 9.5

## 2018-05-11 MED ORDER — KETOROLAC TROMETHAMINE 30 MG/ML IJ SOLN
INTRAMUSCULAR | Status: AC
  Filled 2018-05-11: qty 1

## 2018-05-11 MED ORDER — ONDANSETRON HCL 4 MG/2ML IJ SOLN
4.0000 mg | Freq: Four times a day (QID) | INTRAMUSCULAR | Status: DC | PRN
  Administered 2018-05-11: 4 mg via INTRAVENOUS
  Filled 2018-05-11: qty 2

## 2018-05-11 MED ORDER — LIDOCAINE HCL (CARDIAC) PF 100 MG/5ML IV SOSY
PREFILLED_SYRINGE | INTRAVENOUS | Status: DC | PRN
  Administered 2018-05-11: 80 mg via INTRAVENOUS

## 2018-05-11 MED ORDER — PHENYLEPHRINE HCL 10 MG/ML IJ SOLN
INTRAMUSCULAR | Status: DC | PRN
  Administered 2018-05-11 (×2): .08 mg via INTRAVENOUS

## 2018-05-11 MED ORDER — PANTOPRAZOLE SODIUM 40 MG PO TBEC
40.0000 mg | DELAYED_RELEASE_TABLET | Freq: Every day | ORAL | Status: DC
  Administered 2018-05-11 – 2018-05-12 (×2): 40 mg via ORAL
  Filled 2018-05-11 (×2): qty 1

## 2018-05-11 MED ORDER — SIMETHICONE 80 MG PO CHEW: 80.0000 mg | CHEWABLE_TABLET | Freq: Four times a day (QID) | ORAL | Status: DC | PRN

## 2018-05-11 MED ORDER — ROCURONIUM BROMIDE 100 MG/10ML IV SOLN
INTRAVENOUS | Status: DC | PRN
  Administered 2018-05-11: 10 mg via INTRAVENOUS
  Administered 2018-05-11: 50 mg via INTRAVENOUS
  Administered 2018-05-11: 5 mg via INTRAVENOUS

## 2018-05-11 MED ORDER — LISINOPRIL 20 MG PO TABS
20.0000 mg | ORAL_TABLET | Freq: Every day | ORAL | Status: DC
  Administered 2018-05-11 – 2018-05-12 (×2): 20 mg via ORAL
  Filled 2018-05-11 (×3): qty 1

## 2018-05-11 MED ORDER — PROPOFOL 10 MG/ML IV BOLUS
INTRAVENOUS | Status: DC | PRN
  Administered 2018-05-11: 50 mg via INTRAVENOUS
  Administered 2018-05-11: 100 mg via INTRAVENOUS

## 2018-05-11 MED ORDER — CEFAZOLIN SODIUM-DEXTROSE 2-4 GM/100ML-% IV SOLN
2.0000 g | INTRAVENOUS | Status: AC
  Administered 2018-05-11: 2 g via INTRAVENOUS

## 2018-05-11 MED ORDER — BUPIVACAINE HCL (PF) 0.25 % IJ SOLN
INTRAMUSCULAR | Status: DC | PRN
  Administered 2018-05-11: 5 mL

## 2018-05-11 MED ORDER — SUGAMMADEX SODIUM 200 MG/2ML IV SOLN
INTRAVENOUS | Status: AC
  Filled 2018-05-11: qty 2

## 2018-05-11 MED ORDER — MIDAZOLAM HCL 2 MG/2ML IJ SOLN
INTRAMUSCULAR | Status: AC
  Filled 2018-05-11: qty 2

## 2018-05-11 MED ORDER — SUGAMMADEX SODIUM 200 MG/2ML IV SOLN
INTRAVENOUS | Status: DC | PRN
  Administered 2018-05-11: 200 mg via INTRAVENOUS

## 2018-05-11 MED ORDER — LIDOCAINE-EPINEPHRINE (PF) 1 %-1:200000 IJ SOLN
INTRAMUSCULAR | Status: DC | PRN
  Administered 2018-05-11: 14 mL

## 2018-05-11 MED ORDER — HYDROMORPHONE HCL 1 MG/ML IJ SOLN
INTRAMUSCULAR | Status: DC | PRN
  Administered 2018-05-11 (×4): .25 mg via INTRAVENOUS

## 2018-05-11 MED ORDER — PHENAZOPYRIDINE HCL 200 MG PO TABS
200.0000 mg | ORAL_TABLET | ORAL | Status: AC
  Administered 2018-05-11: 200 mg via ORAL
  Filled 2018-05-11: qty 1

## 2018-05-11 MED ORDER — KETOROLAC TROMETHAMINE 30 MG/ML IJ SOLN
15.0000 mg | Freq: Four times a day (QID) | INTRAMUSCULAR | Status: AC
  Filled 2018-05-11: qty 1

## 2018-05-11 MED ORDER — PROPOFOL 10 MG/ML IV BOLUS
INTRAVENOUS | Status: AC
  Filled 2018-05-11: qty 20

## 2018-05-11 MED ORDER — SODIUM CHLORIDE 0.9 % IJ SOLN
INTRAMUSCULAR | Status: AC
  Filled 2018-05-11: qty 100

## 2018-05-11 MED ORDER — CEFAZOLIN SODIUM-DEXTROSE 2-4 GM/100ML-% IV SOLN
INTRAVENOUS | Status: AC
  Filled 2018-05-11: qty 100

## 2018-05-11 MED ORDER — ESTRADIOL 0.1 MG/GM VA CREA
TOPICAL_CREAM | VAGINAL | Status: DC | PRN
  Administered 2018-05-11: 1 via VAGINAL

## 2018-05-11 MED ORDER — ONDANSETRON HCL 4 MG/2ML IJ SOLN: 4.0000 mg | Freq: Once | INTRAMUSCULAR | Status: DC | PRN

## 2018-05-11 MED ORDER — HYDROMORPHONE HCL 1 MG/ML IJ SOLN
0.2000 mg | INTRAMUSCULAR | Status: DC | PRN
  Administered 2018-05-11: 0.6 mg via INTRAVENOUS
  Filled 2018-05-11: qty 1

## 2018-05-11 MED ORDER — SENNOSIDES-DOCUSATE SODIUM 8.6-50 MG PO TABS: 1.0000 | ORAL_TABLET | Freq: Every evening | ORAL | Status: DC | PRN

## 2018-05-11 MED ORDER — GLYCOPYRROLATE 0.2 MG/ML IJ SOLN
INTRAMUSCULAR | Status: AC
  Filled 2018-05-11: qty 1

## 2018-05-11 MED ORDER — SODIUM CHLORIDE 0.9 % IJ SOLN
INTRAMUSCULAR | Status: AC
  Filled 2018-05-11: qty 10

## 2018-05-11 MED ORDER — ONDANSETRON HCL 4 MG PO TABS: 4.0000 mg | ORAL_TABLET | Freq: Four times a day (QID) | ORAL | Status: DC | PRN

## 2018-05-11 MED ORDER — DOCUSATE SODIUM 100 MG PO CAPS
100.0000 mg | ORAL_CAPSULE | Freq: Two times a day (BID) | ORAL | Status: DC
  Administered 2018-05-11 – 2018-05-12 (×3): 100 mg via ORAL
  Filled 2018-05-11 (×3): qty 1

## 2018-05-11 MED ORDER — ALUM & MAG HYDROXIDE-SIMETH 200-200-20 MG/5ML PO SUSP: 30.0000 mL | ORAL | Status: DC | PRN

## 2018-05-11 MED ORDER — GABAPENTIN 300 MG PO CAPS
ORAL_CAPSULE | ORAL | Status: AC
  Administered 2018-05-11: 300 mg via ORAL
  Filled 2018-05-11: qty 1

## 2018-05-11 MED ORDER — LABETALOL HCL 5 MG/ML IV SOLN
5.0000 mg | INTRAVENOUS | Status: AC
  Administered 2018-05-11 (×2): 5 mg via INTRAVENOUS

## 2018-05-11 MED ORDER — ACETAMINOPHEN 500 MG PO TABS
1000.0000 mg | ORAL_TABLET | Freq: Four times a day (QID) | ORAL | Status: DC
  Administered 2018-05-11 – 2018-05-12 (×3): 1000 mg via ORAL
  Filled 2018-05-11 (×4): qty 2

## 2018-05-11 MED ORDER — DEXAMETHASONE SODIUM PHOSPHATE 4 MG/ML IJ SOLN
INTRAMUSCULAR | Status: DC | PRN
  Administered 2018-05-11 (×2): 2 mg via INTRAVENOUS

## 2018-05-11 MED ORDER — DEXTROSE-NACL 5-0.45 % IV SOLN
125.0000 mL/h | INTRAVENOUS | Status: AC
  Administered 2018-05-11 – 2018-05-12 (×3): 125 mL/h via INTRAVENOUS

## 2018-05-11 MED ORDER — LACTATED RINGERS IV SOLN
INTRAVENOUS | Status: DC
  Administered 2018-05-11 (×2): via INTRAVENOUS
  Administered 2018-05-11: 75 mL/h via INTRAVENOUS

## 2018-05-11 MED ORDER — ONDANSETRON HCL 4 MG/2ML IJ SOLN
INTRAMUSCULAR | Status: DC | PRN
  Administered 2018-05-11: 4 mg via INTRAVENOUS

## 2018-05-11 MED ORDER — OXYCODONE HCL 5 MG PO TABS: 5.0000 mg | ORAL_TABLET | ORAL | Status: DC | PRN

## 2018-05-11 MED ORDER — PHENYLEPHRINE 40 MCG/ML (10ML) SYRINGE FOR IV PUSH (FOR BLOOD PRESSURE SUPPORT)
PREFILLED_SYRINGE | INTRAVENOUS | Status: AC
  Filled 2018-05-11: qty 10

## 2018-05-11 MED ORDER — LIDOCAINE HCL (CARDIAC) PF 100 MG/5ML IV SOSY
PREFILLED_SYRINGE | INTRAVENOUS | Status: AC
  Filled 2018-05-11: qty 5

## 2018-05-11 MED ORDER — KETOROLAC TROMETHAMINE 30 MG/ML IJ SOLN
15.0000 mg | Freq: Four times a day (QID) | INTRAMUSCULAR | Status: AC
  Administered 2018-05-11 – 2018-05-12 (×4): 15 mg via INTRAVENOUS
  Filled 2018-05-11 (×3): qty 1

## 2018-05-11 MED ORDER — LABETALOL HCL 5 MG/ML IV SOLN
INTRAVENOUS | Status: AC
  Administered 2018-05-11: 5 mg via INTRAVENOUS
  Filled 2018-05-11: qty 4

## 2018-05-11 MED ORDER — BUPIVACAINE-EPINEPHRINE (PF) 0.25% -1:200000 IJ SOLN
INTRAMUSCULAR | Status: AC
  Filled 2018-05-11: qty 30

## 2018-05-11 MED ORDER — STERILE WATER FOR IRRIGATION IR SOLN
Status: DC | PRN
  Administered 2018-05-11 (×3): 1000 mL via INTRAVESICAL

## 2018-05-11 MED ORDER — HYDROMORPHONE HCL 1 MG/ML IJ SOLN
INTRAMUSCULAR | Status: AC
  Filled 2018-05-11: qty 1

## 2018-05-11 SURGICAL SUPPLY — 79 items
ADH SKN CLS APL DERMABOND .7 (GAUZE/BANDAGES/DRESSINGS) ×9
BLADE SURG 15 STRL LF C SS BP (BLADE) ×6 IMPLANT
BLADE SURG 15 STRL SS (BLADE) ×10
CANISTER SUCT 3000ML PPV (MISCELLANEOUS) ×10 IMPLANT
CATH FOLEY 2WAY SLVR 30CC 16FR (CATHETERS) ×2 IMPLANT
CATH ROBINSON RED A/P 16FR (CATHETERS) ×2 IMPLANT
CONT PATH 16OZ SNAP LID 3702 (MISCELLANEOUS) IMPLANT
COVER BACK TABLE 60X90IN (DRAPES) ×5 IMPLANT
COVER MAYO STAND STRL (DRAPES) ×10 IMPLANT
DECANTER SPIKE VIAL GLASS SM (MISCELLANEOUS) IMPLANT
DERMABOND ADVANCED (GAUZE/BANDAGES/DRESSINGS) ×6
DERMABOND ADVANCED .7 DNX12 (GAUZE/BANDAGES/DRESSINGS) ×9 IMPLANT
DEVICE CAPIO SLIM SINGLE (INSTRUMENTS) IMPLANT
DRAIN PENROSE 1/4X12 LTX (DRAIN) ×5 IMPLANT
DRAPE UNDERBUTTOCKS STRL (DRAPE) ×5 IMPLANT
DRSG OPSITE 4X5.5 SM (GAUZE/BANDAGES/DRESSINGS) IMPLANT
DURAPREP 26ML APPLICATOR (WOUND CARE) ×5 IMPLANT
ELECT REM PT RETURN 9FT ADLT (ELECTROSURGICAL) ×5
ELECTRODE REM PT RTRN 9FT ADLT (ELECTROSURGICAL) IMPLANT
GAUZE 4X4 16PLY RFD (DISPOSABLE) ×2 IMPLANT
GAUZE PACKING 1 X5 YD ST (GAUZE/BANDAGES/DRESSINGS) ×5 IMPLANT
GAUZE PACKING 2X5 YD STRL (GAUZE/BANDAGES/DRESSINGS) ×2 IMPLANT
GLOVE BIO SURGEON STRL SZ 6.5 (GLOVE) ×12 IMPLANT
GLOVE BIO SURGEON STRL SZ7.5 (GLOVE) ×5 IMPLANT
GLOVE BIO SURGEONS STRL SZ 6.5 (GLOVE) ×3
GLOVE BIOGEL PI IND STRL 6.5 (GLOVE) ×3 IMPLANT
GLOVE BIOGEL PI IND STRL 7.0 (GLOVE) ×9 IMPLANT
GLOVE BIOGEL PI IND STRL 8 (GLOVE) ×6 IMPLANT
GLOVE BIOGEL PI INDICATOR 6.5 (GLOVE) ×2
GLOVE BIOGEL PI INDICATOR 7.0 (GLOVE) ×6
GLOVE BIOGEL PI INDICATOR 8 (GLOVE) ×4
GLOVE ECLIPSE 6.5 STRL STRAW (GLOVE) ×10 IMPLANT
GOWN STRL REUS W/TWL LRG LVL3 (GOWN DISPOSABLE) ×20 IMPLANT
LIGASURE LAP L-HOOKWIRE 5 44CM (INSTRUMENTS) ×2 IMPLANT
NDL INSUFFLATION 14GA 120MM (NEEDLE) ×3 IMPLANT
NEEDLE INSUFFLATION 14GA 120MM (NEEDLE) ×5 IMPLANT
NS IRRIG 1000ML POUR BTL (IV SOLUTION) ×10 IMPLANT
PACK LAVH (CUSTOM PROCEDURE TRAY) ×5 IMPLANT
PACK ROBOTIC GOWN (GOWN DISPOSABLE) ×15 IMPLANT
PACK VAGINAL WOMENS (CUSTOM PROCEDURE TRAY) ×10 IMPLANT
PAD OB MATERNITY 4.3X12.25 (PERSONAL CARE ITEMS) ×5 IMPLANT
PAD PREP 24X48 CUFFED NSTRL (MISCELLANEOUS) ×5 IMPLANT
PENCIL BUTTON HOLSTER BLD 10FT (ELECTRODE) ×5 IMPLANT
PLUG CATH AND CAP STER (CATHETERS) ×7 IMPLANT
RETRACTOR STAY HOOK 5MM (MISCELLANEOUS) IMPLANT
SET CYSTO W/LG BORE CLAMP LF (SET/KITS/TRAYS/PACK) ×12 IMPLANT
SET IRRIG TUBING LAPAROSCOPIC (IRRIGATION / IRRIGATOR) IMPLANT
SHEET LAVH (DRAPES) ×5 IMPLANT
SLING SUPRIS RETROPUBIC KIT (Miscellaneous) ×2 IMPLANT
SUT CAPIO ETHIBPND (SUTURE) IMPLANT
SUT MNCRL 0 MO-4 VIOLET 18 CR (SUTURE) ×3 IMPLANT
SUT MON AB 2-0 CT1 27 (SUTURE) ×10 IMPLANT
SUT MONOCRYL 0 MO 4 18  CR/8 (SUTURE) ×2
SUT SILK 2 0 PERMA HAND 18 BK (SUTURE) IMPLANT
SUT VIC AB 0 CT1 18XCR BRD8 (SUTURE) ×6 IMPLANT
SUT VIC AB 0 CT1 27 (SUTURE) ×20
SUT VIC AB 0 CT1 27XBRD ANBCTR (SUTURE) IMPLANT
SUT VIC AB 0 CT1 36 (SUTURE) ×10 IMPLANT
SUT VIC AB 0 CT1 8-18 (SUTURE) ×10
SUT VIC AB 2-0 CT1 27 (SUTURE) ×10
SUT VIC AB 2-0 CT1 TAPERPNT 27 (SUTURE) IMPLANT
SUT VIC AB 2-0 CT2 27 (SUTURE) IMPLANT
SUT VIC AB 2-0 SH 27 (SUTURE) ×5
SUT VIC AB 2-0 SH 27XBRD (SUTURE) IMPLANT
SUT VIC AB 2-0 UR5 27 (SUTURE) ×10 IMPLANT
SUT VIC AB 2-0 UR6 27 (SUTURE) ×10 IMPLANT
SUT VIC AB 4-0 PS2 18 (SUTURE) ×2 IMPLANT
SUT VICRYL 0 TIES 12 18 (SUTURE) ×5 IMPLANT
SUT VICRYL 0 UR6 27IN ABS (SUTURE) ×5 IMPLANT
SUT VICRYL 4-0 PS2 18IN ABS (SUTURE) ×15 IMPLANT
SYR BULB IRRIGATION 50ML (SYRINGE) ×5 IMPLANT
TOWEL OR 17X24 6PK STRL BLUE (TOWEL DISPOSABLE) ×25 IMPLANT
TRAY FOLEY W/BAG SLVR 14FR (SET/KITS/TRAYS/PACK) ×10 IMPLANT
TROCAR XCEL NON-BLD 11X100MML (ENDOMECHANICALS) ×5 IMPLANT
TROCAR XCEL NON-BLD 5MMX100MML (ENDOMECHANICALS) ×5 IMPLANT
TUBING INSUF HEATED (TUBING) ×5 IMPLANT
TUBING NON-CON 1/4 X 20 CONN (TUBING) ×4 IMPLANT
TUBING NON-CON 1/4 X 20' CONN (TUBING) ×1
WARMER LAPAROSCOPE (MISCELLANEOUS) ×5 IMPLANT

## 2018-05-11 NOTE — Anesthesia Postprocedure Evaluation (Signed)
Anesthesia Post Note  Patient: Zniya Cottone Mom  Procedure(s) Performed: LAPAROSCOPIC ASSISTED VAGINAL HYSTERECTOMY WITH SALPINGO OOPHORECTOMY, MCCALL'S CULDOPLASTY (Bilateral ) ANTERIOR (CYSTOCELE) REPAIR (N/A ) CYSTOSCOPY CYSTOSCOPY (N/A ) PUBO-VAGINAL SLING (N/A )     Patient location during evaluation: PACU Anesthesia Type: General Level of consciousness: awake and alert Pain management: pain level controlled Vital Signs Assessment: post-procedure vital signs reviewed and stable Respiratory status: spontaneous breathing, nonlabored ventilation, respiratory function stable and patient connected to nasal cannula oxygen Cardiovascular status: blood pressure returned to baseline and stable Postop Assessment: no apparent nausea or vomiting Anesthetic complications: no    Last Vitals:  Vitals:   05/11/18 1220 05/11/18 1256  BP: (!) 164/87 133/74  Pulse: 85 67  Resp: 18   Temp:    SpO2: 96%     Last Pain:  Vitals:   05/11/18 1340  TempSrc:   PainSc: 0-No pain   Pain Goal: Patients Stated Pain Goal: 3 (05/11/18 1145)               Anthon Harpole

## 2018-05-11 NOTE — Addendum Note (Signed)
Addendum  created 05/11/18 1411 by Ignacia Bayley, CRNA   Sign clinical note

## 2018-05-11 NOTE — Anesthesia Preprocedure Evaluation (Addendum)
Anesthesia Evaluation  Patient identified by MRN, date of birth, ID band Patient awake    Reviewed: Allergy & Precautions, NPO status , Patient's Chart, lab work & pertinent test results  Airway Mallampati: II  TM Distance: <3 FB Neck ROM: Full    Dental  (+) Teeth Intact, Dental Advisory Given   Pulmonary former smoker,    Pulmonary exam normal breath sounds clear to auscultation       Cardiovascular hypertension, Pt. on medications Normal cardiovascular exam Rhythm:Regular Rate:Normal  H/o WPW  Echo 03/31/16: Study Conclusions  - Left ventricle: The cavity size was normal. Systolic function was vigorous. The estimated ejection fraction was in the range of 65% to 70%. Wall motion was normal; there were no regional wall motion abnormalities. Doppler parameters are consistent with abnormal left ventricular relaxation (grade 1 diastolic dysfunction). - Aortic valve: Trileaflet; normal thickness, mildly calcified   leaflets. - Tricuspid valve: There was mild regurgitation. - Pulmonary arteries: Systolic pressure was mildly increased. PA peak pressure: 31 mm Hg (S).   Neuro/Psych negative neurological ROS     GI/Hepatic negative GI ROS, Neg liver ROS,   Endo/Other  negative endocrine ROS  Renal/GU negative Renal ROS     Musculoskeletal negative musculoskeletal ROS (+)   Abdominal   Peds  Hematology negative hematology ROS (+)   Anesthesia Other Findings Day of surgery medications reviewed with the patient.  Reproductive/Obstetrics Uterine prolapse                            Anesthesia Physical Anesthesia Plan  ASA: II  Anesthesia Plan: General   Post-op Pain Management:    Induction: Intravenous  PONV Risk Score and Plan: 4 or greater and Midazolam, Diphenhydramine, Dexamethasone and Ondansetron  Airway Management Planned: Oral ETT  Additional Equipment:   Intra-op Plan:    Post-operative Plan: Extubation in OR  Informed Consent: I have reviewed the patients History and Physical, chart, labs and discussed the procedure including the risks, benefits and alternatives for the proposed anesthesia with the patient or authorized representative who has indicated his/her understanding and acceptance.   Dental advisory given  Plan Discussed with: CRNA  Anesthesia Plan Comments:         Anesthesia Quick Evaluation

## 2018-05-11 NOTE — Anesthesia Postprocedure Evaluation (Signed)
Anesthesia Post Note  Patient: Christine Livingston  Procedure(s) Performed: LAPAROSCOPIC ASSISTED VAGINAL HYSTERECTOMY WITH SALPINGO OOPHORECTOMY, MCCALL'S CULDOPLASTY (Bilateral ) ANTERIOR (CYSTOCELE) REPAIR (N/A ) CYSTOSCOPY CYSTOSCOPY (N/A ) PUBO-VAGINAL SLING (N/A )     Patient location during evaluation: PACU Anesthesia Type: General Level of consciousness: awake and alert, oriented and awake Pain management: pain level controlled Vital Signs Assessment: post-procedure vital signs reviewed and stable Respiratory status: spontaneous breathing, nonlabored ventilation and respiratory function stable Cardiovascular status: blood pressure returned to baseline and stable Postop Assessment: no apparent nausea or vomiting Anesthetic complications: no    Last Vitals:  Vitals:   05/11/18 1220 05/11/18 1256  BP: (!) 164/87 133/74  Pulse: 85 67  Resp: 18   Temp:    SpO2: 96%     Last Pain:  Vitals:   05/11/18 1231  TempSrc:   PainSc: 3    Pain Goal: Patients Stated Pain Goal: 3 (05/11/18 1145)               Catalina Gravel

## 2018-05-11 NOTE — Progress Notes (Signed)
Patient looks good Vitals normal Reviewed  Case See In am

## 2018-05-11 NOTE — Transfer of Care (Signed)
Immediate Anesthesia Transfer of Care Note  Patient: Christine Livingston  Procedure(s) Performed: LAPAROSCOPIC ASSISTED VAGINAL HYSTERECTOMY WITH SALPINGO OOPHORECTOMY, CYSTOSCOPY (Bilateral ) ANTERIOR (CYSTOCELE) AND POSTERIOR REPAIR (RECTOCELE) (N/A ) CYSTOSCOPY (N/A ) PUBO-VAGINAL SLING (N/A )  Patient Location: PACU  Anesthesia Type:General  Level of Consciousness: awake, alert  and oriented  Airway & Oxygen Therapy: Patient Spontanous Breathing and Patient connected to nasal cannula oxygen  Post-op Assessment: Report given to RN, Post -op Vital signs reviewed and stable and Patient moving all extremities X 4  Post vital signs: Reviewed and stable  Last Vitals:  Vitals Value Taken Time  BP 161/93 05/11/2018 10:50 AM  Temp    Pulse 99 05/11/2018 10:51 AM  Resp 25 05/11/2018 10:51 AM  SpO2 100 % 05/11/2018 10:51 AM  Vitals shown include unvalidated device data.  Last Pain:  Vitals:   05/11/18 0629  TempSrc: Oral  PainSc: 0-No pain      Patients Stated Pain Goal: 3 (79/89/21 1941)  Complications: No apparent anesthesia complications

## 2018-05-11 NOTE — Op Note (Signed)
Preoperative diagnosis: Stress urinary incontinence Postoperative diagnosis: Stress urinary incontinence Surgery: Sling cystourethropexy and cystoscopy Surgeon: Dr. Nicki Reaper Yonah Tangeman   The patient has the above diagnoses and consent of the above procedure.  Extra care was taken with leg positioning to minimize the risk of compartment syndrome and neuropathy and deep vein thrombosis.  Leg adjustments was done by urology.  Preoperative antibiotics were given  On inspection she had excellent vaginal length and a very good cystocele repair.  I made two 1 cm incisions 1 fingerbreadth above the symphysis pubis 1.5 cm lateral to the midline  I carefully marked a 2 cm suburethral incision and instilled 3 cc of a lidocaine epinephrine mixture.  I made the incision appropriate depth.  I mobilized to the urethrovesical angle bilaterally with my scissors.  With the bladder emptied I passed the trocar on top of and along the back of the symphysis pubis using my box technique.  It was delivered onto the pulp of my index finger bilaterally  When I cystoscoped the patient there was no injury to bladder.  The trochars were outside the bladder.  On the left side I thought the indentation was quite moderate so I duplicated the same past and rechecked location.  There was minimal indentation bilaterally of bladder wall with trocar.  There is no question the trocars were not in the bladder.  There was excellent efflux of yellow light bilaterally and no bladder injury from cystocele repair  With the bladder empty I attached the mesh with the described technique and is brought up through the retropubic space.  It was tensioned over the fat part of the moderate size Kelly clamp.  There is appropriate hypermobility.  There is no spring back.  I was very pleased with attention in position of the sling.  She does have a 1 L bladder and this was taken to account tensioning the sling.  I closed the anterior vaginal wall after  irrigation with running 2-0 Vicryl and CT1 needle followed by 2 interrupted sutures.  The mesh was cut below the skin level and each incision was closed with 2 interrupted 4-0 Vicryl and Dermabond  I was very pleased with the surgery and hopefully it reaches her treatment goal.  Blood loss was less than 30 mL.  Leg position was good.

## 2018-05-11 NOTE — Op Note (Addendum)
PREOPERATIVE DIAGNOSES: 1. Pelvic organ prolapse 2. Stress urinary incontinence  POSTOPERATIVE DIAGNOSES: 1. same  PROCEDURE PERFORMED: Laparoscopic-assisted total vaginal hysterectomy with bilateral salpingoophorectomy, Modified McCall's culdoplasty, Anterior Colporrhaphy, cystoscopy  SURGEON: Dr. Lucillie Garfinkel ASSISTANT: Dr. Arvella Nigh  ANESTHESIA: General   ESTIMATED BLOOD LOSS: 150cc.  URINE OUTPUT: 50 cc of clear urine at the end of the procedure.  FLUIDS: See anesthesia record  COMPLICATIONS: None  TUBES: None.  DRAINS: Foley to gravity  PATHOLOGY: Uterus with large fibroid, cervix, bilateral fallopian tubes and ovaries, were sent to pathology for review.  FINDINGS: On exam, under anesthesia, normal appearing vulva and vagina, a normal sized uterus, stage 2 anterior prolapse, large hemorrhoids but no posterior prolapse  Operative findings demonstrated a 6 sized uter with a large pedunculated 5cm fibroid. Normal fallopian tubes and ovaries bilaterally. The bowel and omentum were normal appearing.  Procedure: A general anesthesia was induced and the patient was placed in the dirsal lithotomy position. The abdomen, perineum, and vagina were prepped and draped in the usual fashion. A foley catheter inserted into the bladder and attached to straight drainage. After the initial preparation, the procedure commenced at the vagina. With a speculum in place to visualize the cervix, the cervix was grasped and a jacobs tenaculum was placed within the uterine cavity for manipulation purposes being careful not to puncture the uterus. Attention was then turned to the abdomen.    Following infiltration with Marcaine, an infraumbilical incision was made and the Veress needle was gently advanced taking care to feel for the typical sensation of penetrating the peritoneum. With CO2 infiltration, an opening pressure of 3 mmHg was noted, and following this, a pneumoperitoneum of 15 mmHg was  created. A 11 mm trocar was then passed through the same incision and the laparoscope was then inserted through the trocar sleeve.  Visualization of the peritoneal cavity was then obtained and a brief inspection did not reveal any signs of complications from entry. Under direct observation, 54m suprapubic port was placed taking care to respect anatomical landmarks and vessels. Once the placement of the port was complete, the actual laparoscopic procedure began.   Oophorectomy  Beginning on the right side, the Infundibular ligament was identified by lifting the tube towards the anterior wall of the abdomen. The ureter was confirmed along the pelvic side wall and peristalsis was noted. The ligasure device was then used to clamp and ligate the IP ligament in three sequential bites. The IP was then cut middistance, again being sure to be clear of the ureter. Following this, the broad ligament was sequentially grasped, ligated, and cut in the direction of the round ligament hugging next to the fallopian tube. The round ligament was then ligated and cut. Following this, the anterior leaf of the broad ligament was then taken down on the left side, dissecting down towards the peritoneal reflection at the base of the bladder and adjacent to the cervix. The same process was then repeated on the left side such that both sides met and the anterior leaflet had been appropriately skeletonized.  Attention was then turned to the vagina. Jacobs tenaculum was removed.  A weighted speculum was placed in the posterior vaginal vault. The cervix was grasped with a teneculum on both its anterior and posterior lips. 20cc of lidocaine with diluted epi was injected. With downward traction, we made a circumferential incision of the vaginal mucosa. This allowed dissection and entrance into the posterior cul-de-sac. The posterior peritoneum was sutured to the posterior vaginal  cuff. At this time we visualized the uterosacral ligaments.  These were clamped and ligated with #0 Vicryl suture. The cervicovesical space was then created by both blunt and sharp dissection, allowing [place] to see the cardinal ligaments. These were clamped and ligated with #0 Vicryl suture as well. Once in the cervicovesical space, we were then able to completely ligate the uterosacral-cardinal ligament complex with #0 Vicryl suture. The anterior cul-de-sac was then entered sharply. Omentum and intestines were then visualized through the anterior cul-de-sac window and we began removal of the uterine body. The uterine arteries were then clamped and ligated with #0 Vicryl suture and carried upward toward the uterus until complete removal was obtained. Number 0 Vicryl suture was used on all major pedicles with Heaney clamping until the uterus was completely removed with the cervix, and bilateral fallopian tubes and ovaries. We then removed the weighted duckbill speculum and placed a regular speculum in the vaginal vault and visualized the entire area. We inspected for hemostasis, and this was secured. A dilute epi/lidocaine solution was infiltrated under the anterior vaginal mucosa midline. A small incision was made in the vaginal mucosa at the vaginal vault and the Metzenbaum scissors were then used to dissect the mucosa off of the cystocele and cut the vaginal mucosa in the midline. The cut edges were held and splayed laterally with a series of Allis clamps. The bladder was dissected away along the lateral edges with a combination of sharp and blunt dissection, exposing the vesicovaginal space. A series of #2-0 Vicryl interrupted sutures were then placed sequentially along the lateral folds of the vesicovaginal space and brought together to tuck the bladder back while simultaneously bringing the lateral vaginal tissues together. The excess vaginal mucosa was then trimmed. The vagina was then closed with a running #0 Vicryl suture.   A modified McCall's culdoplasty was  performed by placing a stitch through the uterosacral ligament on the left, purstringing the posterior peritoneum and then placing a stitch through the right uterosacral ligament and tying it all together. Hemostasis was again noted.   The vaginal cuff was closed with #0 vicryl in a running fashion.   A cystoscope was inserted into the bladder. The bladder was intact and normal looking, with no evidence of trauma or perforation. Bilateral ureteric jets were visualized. The bladder was drained through the sheath of the cystoscope, and then the cystoscope was removed. The foley catheter was then reinserted.  Attention then turned to the abdomen where all of the pedicles were noted to be hemostatic and cuff intact from above. Hemostasis was achieved - and confirmed on low flow. All gas removed from abdomen and all instruments were removed. Fascia was closed with 0' vicryl and skin closed with 4'0 vicryl. The sponge and lap counts were correct times 2 at this time.    Dr. Matilde Sprang then performed his portion of the procedure and she was awakened from anesthesia and sent to the recovery room in stable condition.    Arty Baumgartner MD

## 2018-05-11 NOTE — Brief Op Note (Addendum)
05/11/2018  10:42 AM  PATIENT:  Christine Livingston  67 y.o. female  PRE-OPERATIVE DIAGNOSIS:  prolapse  POST-OPERATIVE DIAGNOSIS:  prolapse, STRESS INCONTINENCE  PROCEDURE:  Procedure(s): LAPAROSCOPIC ASSISTED VAGINAL HYSTERECTOMY WITH SALPINGO OOPHORECTOMY, CYSTOSCOPY (Bilateral) ANTERIOR (CYSTOCELE) AND POSTERIOR REPAIR (RECTOCELE) (N/A) CYSTOSCOPY (N/A) PUBO-VAGINAL SLING (N/A)  SURGEON:  Surgeon(s) and Role: Panel 1:    * Minola Guin, Colin Benton, MD - Primary    * Arvella Nigh, MD - Assisting Panel 2:    * Bjorn Loser, MD - Primary  PHYSICIAN ASSISTANT:   ASSISTANTS: Dr. Arvella Nigh   ANESTHESIA:   local and general  EBL:  150 mL   BLOOD ADMINISTERED:none  DRAINS: none   LOCAL MEDICATIONS USED:  LIDOCAINE  and Amount: 20 ml  SPECIMEN:  Source of Specimen:  uterus with cervix, bilateral fallopian tubes and ovaries  DISPOSITION OF SPECIMEN:  PATHOLOGY  COUNTS:  YES  TOURNIQUET:  * No tourniquets in log *  DICTATION: .Note written in EPIC  PLAN OF CARE: Admit for overnight observation  PATIENT DISPOSITION:  PACU - hemodynamically stable.   Delay start of Pharmacological VTE agent (>24hrs) due to surgical blood loss or risk of bleeding: not applicable

## 2018-05-11 NOTE — Progress Notes (Signed)
Day of Surgery Procedure(s) (LRB): LAPAROSCOPIC ASSISTED VAGINAL HYSTERECTOMY WITH SALPINGO OOPHORECTOMY, MCCALL'S CULDOPLASTY (Bilateral) ANTERIOR (CYSTOCELE) REPAIR (N/A) CYSTOSCOPY (N/A) PUBO-VAGINAL SLING (N/A) CYSTOSCOPY  Subjective: Patient reports tolerating PO.    Objective: I have reviewed patient's vital signs and intake and output.  General: alert, cooperative and appears stated age 67: NWOB Cardio: regular rate and rhythm, S1, S2 normal, no murmur, click, rub or gallop GI: soft, non-tender; bowel sounds normal; no masses,  no organomegaly Extremities: extremities normal, atraumatic, no cyanosis or edema Vaginal Bleeding: none  Incisions c/d/i  Assessment: s/p Procedure(s): LAPAROSCOPIC ASSISTED VAGINAL HYSTERECTOMY WITH SALPINGO OOPHORECTOMY, MCCALL'S CULDOPLASTY (Bilateral) ANTERIOR (CYSTOCELE) REPAIR (N/A) CYSTOSCOPY (N/A) PUBO-VAGINAL SLING (N/A) CYSTOSCOPY: stable and progressing well  Plan: Advance diet  Voiding trial per Dr. Matilde Sprang  LOS: 0 days    Tyson Dense 05/11/2018, 4:29 PM

## 2018-05-11 NOTE — Anesthesia Procedure Notes (Signed)
Procedure Name: Intubation Date/Time: 05/11/2018 7:42 AM Performed by: Catalina Gravel, MD Pre-anesthesia Checklist: Patient identified, Patient being monitored, Timeout performed, Emergency Drugs available and Suction available Patient Re-evaluated:Patient Re-evaluated prior to induction Oxygen Delivery Method: Circle System Utilized Preoxygenation: Pre-oxygenation with 100% oxygen Induction Type: IV induction Ventilation: Mask ventilation without difficulty Laryngoscope Size: Miller and 2 Grade View: Grade II Tube type: Oral Tube size: 7.0 mm Number of attempts: 1 Airway Equipment and Method: stylet Placement Confirmation: ETT inserted through vocal cords under direct vision,  positive ETCO2 and breath sounds checked- equal and bilateral Secured at: 22 cm Tube secured with: Tape Dental Injury: Teeth and Oropharynx as per pre-operative assessment

## 2018-05-12 DIAGNOSIS — N393 Stress incontinence (female) (male): Secondary | ICD-10-CM | POA: Diagnosis not present

## 2018-05-12 LAB — CBC
HCT: 40.1 % (ref 36.0–46.0)
Hemoglobin: 13.3 g/dL (ref 12.0–15.0)
MCH: 28.6 pg (ref 26.0–34.0)
MCHC: 33.2 g/dL (ref 30.0–36.0)
MCV: 86.2 fL (ref 80.0–100.0)
PLATELETS: 213 10*3/uL (ref 150–400)
RBC: 4.65 MIL/uL (ref 3.87–5.11)
RDW: 12.9 % (ref 11.5–15.5)
WBC: 9.3 10*3/uL (ref 4.0–10.5)
nRBC: 0 % (ref 0.0–0.2)

## 2018-05-12 MED ORDER — IBUPROFEN 600 MG PO TABS: 600.0000 mg | ORAL_TABLET | Freq: Four times a day (QID) | ORAL | 0 refills | Status: DC | PRN

## 2018-05-12 MED ORDER — DOCUSATE SODIUM 100 MG PO CAPS: 100.0000 mg | ORAL_CAPSULE | Freq: Two times a day (BID) | ORAL | 2 refills | Status: DC

## 2018-05-12 NOTE — Progress Notes (Signed)
11:40am - Patient unable to void.  PVR via bladder scanner 629ml.  Phoned Dr. Matilde Sprang with update.  He stated that patient could self-catheterize and be discharged home.    Patient anxious about ability to self-catheterize and asking for assistance from RN.  RN held mirror for patient as she stood above the toilet to try self-cath.  Patient unable to locate urethral meatus.  Patient lay down on the bed and RN held mirror.  Patient was able to self-catheterize with RN cuing about placement of catheter.    Patient discharged to home with husband.  States she is confident in her ability to catheterize herself at home if needed.  No equipment for home ordered at discharge.  Patient ambulated to car with Santiago Bur, NT.

## 2018-05-12 NOTE — Discharge Summary (Signed)
Physician Discharge Summary  Patient ID: Christine Livingston MRN: [id number] DOB/AGE: 67-Jul-1952 67 y.o.  Admit date: 05/11/2018 Discharge date: 05/12/2018  Admission Diagnoses:  Discharge Diagnoses:  Active Problems:   S/P hysterectomy with oophorectomy   Discharged Condition: good  Hospital Course: Pt is a 67yo admitted for scheduled above surgery. She Had an uncomplicated LAVH BSO, anterior repair, McCalls culdoplasty, and cysto by myself and sling by Dr. Matilde Sprang with an EBL of 150cc. She had an uncomplicated postoperative course BSO, anterior repair, McCalls culdoplasty, and cysto by myself and sling by Dr. Matilde Sprang with an EBL of 150cc. She had an uncomplicated postoperative course and on POD#2 was meeting all goals such as ambulating, voiding freely, and tolerating a general diet and she was deemed stable for discharge home.    Consults: None  Significant Diagnostic Studies: labs:  CBC    Component Value Date/Time   WBC 9.3 05/12/2018 0525   RBC 4.65 05/12/2018 0525   HGB 13.3 05/12/2018 0525   HCT 40.1 05/12/2018 0525   PLT 213 05/12/2018 0525   MCV 86.2 05/12/2018 0525   MCV 84.8 11/21/2017 1501   MCH 28.6 05/12/2018 0525   MCHC 33.2 05/12/2018 0525   RDW 12.9 05/12/2018 0525   LYMPHSABS 1.5 04/07/2017 0957   MONOABS 0.3 04/07/2017 0957   EOSABS 0.0 04/07/2017 0957   BASOSABS 0.0 04/07/2017 0957     Treatments: surgery: LAVH BSO, anterior repair, McCalls culdoplasty, and cysto by myself and sling by Dr. Matilde Sprang   Discharge Exam: Blood pressure 140/70, pulse 87, temperature 98.6 F (37 C), temperature source Oral, resp. rate 18, SpO2 97 %. General appearance: alert, cooperative and appears stated age Resp: clear to auscultation bilaterally Cardio: regular rate and rhythm, S1, S2 normal, no murmur, click, rub or gallop GI: soft, non-tender; bowel sounds normal; no masses,  no organomegaly Pelvic: external genitalia normal and minimal old blood on pad Incision/Wound: c/d/i  Disposition:     Allergies as of 05/12/2018      Reactions   Bactrim [sulfamethoxazole-trimethoprim] Rash, Other (See Comments)   Leg  cramp   Cephalexin Diarrhea      Medication List    TAKE these medications   BENEFIBER PO Take by mouth daily.   CALCIUM 600/VITAMIN D3 PO Take 2 tablets by mouth daily.   docusate sodium 100 MG capsule Commonly known as:  COLACE Take 1 capsule (100 mg total) by mouth 2 (two) times daily.   HAIR SKIN & NAILS GUMMIES PO Take 2 each by mouth daily.   ibuprofen 600 MG tablet Commonly known as:  ADVIL,MOTRIN Take 1 tablet (600 mg total) by mouth every 6 (six) hours as needed.   lisinopril 20 MG tablet Commonly known as:  PRINIVIL,ZESTRIL Take 1 tablet a day by mouth daily. What changed:    how much to take  how to take this  when to take this  additional instructions      Oxycodone paper Rx given - take prn pain q 6 hours severe pain.   Fu in office in 1-2 weeks.    Signed: Tyson Dense 05/12/2018, 8:52 AM

## 2018-05-12 NOTE — Progress Notes (Signed)
Patient looks good Labs and vitals normal No leg pain Reviewed post op instructions Void tial d/c home

## 2018-06-01 NOTE — Addendum Note (Signed)
Addendum  created 06/01/18 1427 by Flossie Dibble, CRNA   Charge Capture section accepted

## 2018-06-07 ENCOUNTER — Encounter (HOSPITAL_COMMUNITY): Payer: Self-pay | Admitting: Obstetrics and Gynecology

## 2018-06-21 ENCOUNTER — Encounter: Payer: Self-pay | Admitting: Family Medicine

## 2018-06-21 ENCOUNTER — Ambulatory Visit: Payer: Medicare Other | Admitting: Family Medicine

## 2018-06-21 VITALS — BP 132/74 | HR 79 | Temp 98.6°F | Ht 67.0 in | Wt 167.0 lb

## 2018-06-21 DIAGNOSIS — I10 Essential (primary) hypertension: Secondary | ICD-10-CM

## 2018-06-21 DIAGNOSIS — Z780 Asymptomatic menopausal state: Secondary | ICD-10-CM | POA: Diagnosis not present

## 2018-06-21 DIAGNOSIS — M546 Pain in thoracic spine: Secondary | ICD-10-CM | POA: Diagnosis not present

## 2018-06-21 MED ORDER — TIZANIDINE HCL 2 MG PO CAPS: 2.0000 mg | ORAL_CAPSULE | Freq: Three times a day (TID) | ORAL | 2 refills | Status: DC | PRN

## 2018-06-21 NOTE — Assessment & Plan Note (Signed)
No red flags.  She will continue taking anti-inflammatories as needed.  Also recommended alternating heat and ice to the area.  Will start low-dose Zanaflex 2 mg 3 times daily as needed.

## 2018-06-21 NOTE — Progress Notes (Signed)
   Subjective:  Christine Livingston is a 67 y.o. female who presents today with a chief complaint of HTN.   HPI:  HTN, chronic problem Several year history.  On lisinopril 20 mg daily and tolerating well.  Back Livingston, new problem to provider Patient said intermittent back Livingston and back spasms over the past several years.  Symptoms have worsened recently after she had her hysterectomy about a month ago.  She has tried taking Advil and Tylenol which helped.  Symptoms are improving.  She takes meloxicam daily which helps.  No other obvious alleviating or aggravating factors.  ROS: Per HPI  PMH: She reports that she quit smoking about 37 years ago. Her smoking use included cigarettes. She smoked 1.00 pack per day. She has never used smokeless tobacco. She reports that she drinks alcohol. She reports that she does not use drugs.  Objective:  Physical Exam: BP 132/74 (BP Location: Left Arm, Patient Position: Sitting, Cuff Size: Normal)   Pulse 79   Temp 98.6 F (37 C) (Oral)   Ht 5\' 7"  (1.702 m)   Wt 167 lb (75.8 kg)   LMP  (LMP Unknown)   SpO2 98%   BMI 26.16 kg/m   Gen: NAD, resting comfortably CV: RRR with no murmurs appreciated Pulm: NWOB, CTAB with no crackles, wheezes, or rhonchi MSK -Back: No deformities.  Mildly tender to palpation along paraspinal muscles.  Full range of motion.  No spinous process tenderness -Lower extremities: No deformities.  Left foot in walking boot due to recent ankle fracture.  Strength 5 out of 5 otherwise.  Assessment/Plan:  Post-menopause Continue calcium and vitamin D supplementation.  Due for DEXA scan next year.  Thoracic back Livingston No red flags.  She will continue taking anti-inflammatories as needed.  Also recommended alternating heat and ice to the area.  Will start low-dose Zanaflex 2 mg 3 times daily as needed.  Essential hypertension At goal.  Continue lisinopril 20 mg daily.  Check BMET with next blood draw.  Christine Livingston. Christine Pain,  MD 06/21/2018 12:42 PM

## 2018-06-21 NOTE — Assessment & Plan Note (Signed)
At goal.  Continue lisinopril 20 mg daily.  Check BMET with next blood draw.

## 2018-06-21 NOTE — Assessment & Plan Note (Signed)
Continue calcium and vitamin D supplementation.  Due for DEXA scan next year.

## 2018-06-21 NOTE — Patient Instructions (Signed)
It was very nice to see you today!  We we send in zanaflex. Please use as needed.  No other changes today.  Keep up the good work!  Come back to see me in 1 year or sooner as needed.   Take care, Dr Jerline Pain

## 2018-07-09 ENCOUNTER — Ambulatory Visit: Payer: Medicare Other | Admitting: Family Medicine

## 2018-07-09 ENCOUNTER — Ambulatory Visit: Payer: Medicare Other | Admitting: Internal Medicine

## 2018-07-09 ENCOUNTER — Encounter: Payer: Self-pay | Admitting: Internal Medicine

## 2018-07-09 VITALS — BP 164/94 | HR 95 | Temp 99.1°F | Wt 169.1 lb

## 2018-07-09 DIAGNOSIS — J22 Unspecified acute lower respiratory infection: Secondary | ICD-10-CM | POA: Diagnosis not present

## 2018-07-09 DIAGNOSIS — J019 Acute sinusitis, unspecified: Secondary | ICD-10-CM | POA: Diagnosis not present

## 2018-07-09 MED ORDER — DOXYCYCLINE HYCLATE 100 MG PO TABS: 100.0000 mg | ORAL_TABLET | Freq: Two times a day (BID) | ORAL | 0 refills | Status: AC

## 2018-07-09 NOTE — Progress Notes (Signed)
Chief Complaint  Patient presents with  . Cough    Dry Cough and cold symptoms, not sleeping well, HA x 1 week but cough is worsening last few days. Pt has been taking Nyquil OTC. Some left sided slight CP. Denies SOB, wheezing, chest tx.  Husband is in the hospital with PNA    HPI: Christine Livingston 67 y.o. come in for cough  For over a week  sda  PCP NA Her husband got Sick over thanks giving  And    In icu at [facility] for pna  ? Rhinovirus plu eval for bact   Cold for a at least a week before her onset  And  and the last couple days worse  And headache and congestion fullness and cough but nos ob  See above  Headache and fullness noted    hyst and bladder tack in oct and then nov had le fracture  Ankle.   Freak falling episode   Generally no lung disease   bp usually better than today  ROS: See pertinent positives and negatives per HPI. No sob had some left parasternal pain last night but no assic sx   Past Medical History:  Diagnosis Date  . Constipation   . Hemorrhoid    banding  . Hypertension   . SVD (spontaneous vaginal delivery)    x 1  . WPW (Wolff-Parkinson-White syndrome)    occasional fast heart beat, pt coughs and goes back to normal, no meds    Family History  Problem Relation Age of Onset  . Heart disease Mother   . Heart failure Mother   . Hypertension Father   . Diabetes Sister   . Hypertension Sister   . Diabetes Paternal Grandmother   . Breast cancer Paternal Aunt   . Pancreatic cancer Paternal Uncle     Social History   Socioeconomic History  . Marital status: Married    Spouse name: Not on file  . Number of children: 1  . Years of education: Not on file  . Highest education level: Not on file  Occupational History  . Not on file  Social Needs  . Financial resource strain: Not on file  . Food insecurity:    Worry: Not on file    Inability: Not on file  . Transportation needs:    Medical: Not on file    Non-medical: Not on file    Tobacco Use  . Smoking status: Former Smoker    Packs/day: 1.00    Types: Cigarettes    Last attempt to quit: 10/28/1980    Years since quitting: 37.7  . Smokeless tobacco: Never Used  Substance and Sexual Activity  . Alcohol use: Yes    Alcohol/week: 0.0 standard drinks    Comment: occasional wine couple times a month  . Drug use: Never  . Sexual activity: Not on file  Lifestyle  . Physical activity:    Days per week: Not on file    Minutes per session: Not on file  . Stress: Not on file  Relationships  . Social connections:    Talks on phone: Not on file    Gets together: Not on file    Attends religious service: Not on file    Active member of club or organization: Not on file    Attends meetings of clubs or organizations: Not on file    Relationship status: Not on file  Other Topics Concern  . Not on file  Social History  Narrative  . Not on file    Outpatient Medications Prior to Visit  Medication Sig Dispense Refill  . Biotin w/ Vitamins C & E (HAIR SKIN & NAILS GUMMIES PO) Take 2 each by mouth daily.    . Calcium Carb-Cholecalciferol (CALCIUM 600/VITAMIN D3 PO) Take 2 tablets by mouth daily.    . cholecalciferol (VITAMIN D3) 25 MCG (1000 UT) tablet Take 4,000 Units by mouth daily.    [name] lisinopril (PRINIVIL,ZESTRIL) 20 MG tablet Take 1 tablet a day by mouth daily. (Patient taking differently: Take 20 mg by mouth daily. ) 90 tablet 3  . meloxicam (MOBIC) 15 MG tablet Take 15 mg by mouth daily.    . nitrofurantoin (MACRODANTIN) 100 MG capsule Take 100 mg by mouth daily.    . tizanidine (ZANAFLEX) 2 MG capsule Take 1 capsule (2 mg total) by mouth 3 (three) times daily as needed for muscle spasms. 30 capsule 2  . Wheat Dextrin (BENEFIBER PO) Take by mouth daily.     No facility-administered medications prior to visit.      EXAM:  BP (!) 164/94 (BP Location: Left Arm, Patient Position: Sitting, Cuff Size: Normal)   Pulse 95   Temp 99.1 F (37.3 C) (Oral)   Wt 169  lb 1.6 oz (76.7 kg)   LMP  (LMP Unknown)   SpO2 97%   BMI 26.48 kg/m   Body mass index is 26.48 kg/m. WDWN in NAD  quiet respirations;  congested  . Non toxic . HEENT: Normocephalic ;atraumatic , Eyes;  PERRL, EOMs  Full, lids and conjunctiva clear,,Ears: no deformities, canals nl, TM landmarks normal, Nose: no deformity or discharge but 2+congested;face minimally tender Mouth : OP clear without lesion or edema . Neck: Supple without adenopathy or masses or bruits Chest:  Clear to A without wheezes rales or rhonchi CV:  S1-S2 no gallops or murmurs peripheral perfusion is normal Skin :nl perfusion and no acute rashes  Nl gait  and station  BP Readings from Last 3 Encounters:  07/09/18 (!) 164/94  06/21/18 132/74  05/12/18 (!) 149/83    ASSESSMENT AND PLAN:  Discussed the following assessment and plan:  Acute respiratory infection  Acute sinusitis, recurrence not specified, unspecified location Watch BP   At home  Says usually  in control Saline  Sinus hygiene and time   Expectant management. For sx of pna and can add antibiotic if needed for sinusitis if not improving with  Normal measures  -Patient advised to return or notify health care team  if  new concerns arise.  Patient Instructions  This seems viral with sinusitis and no signs of pneumonia at this time.   Saline nose spray .   And afrin if needed  If  worening symptoms over the weekend then can add antibiotic   Doxycycline.  For bacterial  Sinusitis .   If  high  Fever, short of breath, call on call  team or seek emergent care .     Sinusitis, Adult Sinusitis is soreness and inflammation of your sinuses. Sinuses are hollow spaces in the bones around your face. Your sinuses are located:  Around your eyes.  In the middle of your forehead.  Behind your nose.  In your cheekbones.  Your sinuses and nasal passages are lined with a stringy fluid (mucus). Mucus normally drains out of your sinuses. When your  nasal tissues become inflamed or swollen, the mucus can become trapped or blocked so air cannot flow through your sinuses. This  allows bacteria, viruses, and funguses to grow, which leads to infection. Sinusitis can develop quickly and last for 7?10 days (acute) or for more than 12 weeks (chronic). Sinusitis often develops after a cold. What are the causes? This condition is caused by anything that creates swelling in the sinuses or stops mucus from draining, including:  Allergies.  Asthma.  Bacterial or viral infection.  Abnormally shaped bones between the nasal passages.  Nasal growths that contain mucus (nasal polyps).  Narrow sinus openings.  Pollutants, such as chemicals or irritants in the air.  A foreign object stuck in the nose.  A fungal infection. This is rare.  What increases the risk? The following factors may make you more likely to develop this condition:  Having allergies or asthma.  Having had a recent cold or respiratory tract infection.  Having structural deformities or blockages in your nose or sinuses.  Having a weak immune system.  Doing a lot of swimming or diving.  Overusing nasal sprays.  Smoking.  What are the signs or symptoms? The main symptoms of this condition are pain and a feeling of pressure around the affected sinuses. Other symptoms include:  Upper toothache.  Earache.  Headache.  Bad breath.  Decreased sense of smell and taste.  A cough that may get worse at night.  Fatigue.  Fever.  Thick drainage from your nose. The drainage is often green and it may contain pus (purulent).  Stuffy nose or congestion.  Postnasal drip. This is when extra mucus collects in the throat or back of the nose.  Swelling and warmth over the affected sinuses.  Sore throat.  Sensitivity to light.  How is this diagnosed? This condition is diagnosed based on symptoms, a medical history, and a physical exam. To find out if your condition  is acute or chronic, your health care provider may:  Look in your nose for signs of nasal polyps.  Tap over the affected sinus to check for signs of infection.  View the inside of your sinuses using an imaging device that has a light attached (endoscope).  If your health care provider suspects that you have chronic sinusitis, you may also:  Be tested for allergies.  Have a sample of mucus taken from your nose (nasal culture) and checked for bacteria.  Have a mucus sample examined to see if your sinusitis is related to an allergy.  If your sinusitis does not respond to treatment and it lasts longer than 8 weeks, you may have an MRI or CT scan to check your sinuses. These scans also help to determine how severe your infection is. In rare cases, a bone biopsy may be done to rule out more serious types of fungal sinus disease. How is this treated? Treatment for sinusitis depends on the cause and whether your condition is chronic or acute. If a virus is causing your sinusitis, your symptoms will go away on their own within 10 days. You may be given medicines to relieve your symptoms, including:  Topical nasal decongestants. They shrink swollen nasal passages and let mucus drain from your sinuses.  Antihistamines. These drugs block inflammation that is triggered by allergies. This can help to ease swelling in your nose and sinuses.  Topical nasal corticosteroids. These are nasal sprays that ease inflammation and swelling in your nose and sinuses.  Nasal saline washes. These rinses can help to get rid of thick mucus in your nose.  If your condition is caused by bacteria, you will be given  an antibiotic medicine. If your condition is caused by a fungus, you will be given an antifungal medicine. Surgery may be needed to correct underlying conditions, such as narrow nasal passages. Surgery may also be needed to remove polyps. Follow these instructions at home: Medicines  Take, use, or apply  over-the-counter and prescription medicines only as told by your health care provider. These may include nasal sprays.  If you were prescribed an antibiotic medicine, take it as told by your health care provider. Do not stop taking the antibiotic even if you start to feel better. Hydrate and Humidify  Drink enough water to keep your urine clear or pale yellow. Staying hydrated will help to thin your mucus.  Use a cool mist humidifier to keep the humidity level in your home above 50%.  Inhale steam for 10-15 minutes, 3-4 times a day or as told by your health care provider. You can do this in the bathroom while a hot shower is running.  Limit your exposure to cool or dry air. Rest  Rest as much as possible.  Sleep with your head raised (elevated).  Make sure to get enough sleep each night. General instructions  Apply a warm, moist washcloth to your face 3-4 times a day or as told by your health care provider. This will help with discomfort.  Wash your hands often with soap and water to reduce your exposure to viruses and other germs. If soap and water are not available, use hand sanitizer.  Do not smoke. Avoid being around people who are smoking (secondhand smoke).  Keep all follow-up visits as told by your health care provider. This is important. Contact a health care provider if:  You have a fever.  Your symptoms get worse.  Your symptoms do not improve within 10 days. Get help right away if:  You have a severe headache.  You have persistent vomiting.  You have pain or swelling around your face or eyes.  You have vision problems.  You develop confusion.  Your neck is stiff.  You have trouble breathing. This information is not intended to replace advice given to you by your health care provider. Make sure you discuss any questions you have with your health care provider. Document Released: 07/21/2005 Document Revised: 03/16/2016 Document Reviewed:  05/16/2015 Elsevier Interactive Patient Education  2018 [place]. Panosh M.D.

## 2018-07-09 NOTE — Patient Instructions (Addendum)
This seems viral with sinusitis and no signs of pneumonia at this time.   Saline nose spray .   And afrin if needed  If  worening symptoms over the weekend then can add antibiotic   Doxycycline.  For bacterial  Sinusitis .   If  high  Fever, short of breath, call on call  team or seek emergent care .     Sinusitis, Adult Sinusitis is soreness and inflammation of your sinuses. Sinuses are hollow spaces in the bones around your face. Your sinuses are located:  Around your eyes.  In the middle of your forehead.  Behind your nose.  In your cheekbones.  Your sinuses and nasal passages are lined with a stringy fluid (mucus). Mucus normally drains out of your sinuses. When your nasal tissues become inflamed or swollen, the mucus can become trapped or blocked so air cannot flow through your sinuses. This allows bacteria, viruses, and funguses to grow, which leads to infection. Sinusitis can develop quickly and last for 7?10 days (acute) or for more than 12 weeks (chronic). Sinusitis often develops after a cold. What are the causes? This condition is caused by anything that creates swelling in the sinuses or stops mucus from draining, including:  Allergies.  Asthma.  Bacterial or viral infection.  Abnormally shaped bones between the nasal passages.  Nasal growths that contain mucus (nasal polyps).  Narrow sinus openings.  Pollutants, such as chemicals or irritants in the air.  A foreign object stuck in the nose.  A fungal infection. This is rare.  What increases the risk? The following factors may make you more likely to develop this condition:  Having allergies or asthma.  Having had a recent cold or respiratory tract infection.  Having structural deformities or blockages in your nose or sinuses.  Having a weak immune system.  Doing a lot of swimming or diving.  Overusing nasal sprays.  Smoking.  What are the signs or symptoms? The main symptoms of this  condition are pain and a feeling of pressure around the affected sinuses. Other symptoms include:  Upper toothache.  Earache.  Headache.  Bad breath.  Decreased sense of smell and taste.  A cough that may get worse at night.  Fatigue.  Fever.  Thick drainage from your nose. The drainage is often green and it may contain pus (purulent).  Stuffy nose or congestion.  Postnasal drip. This is when extra mucus collects in the throat or back of the nose.  Swelling and warmth over the affected sinuses.  Sore throat.  Sensitivity to light.  How is this diagnosed? This condition is diagnosed based on symptoms, a medical history, and a physical exam. To find out if your condition is acute or chronic, your health care provider may:  Look in your nose for signs of nasal polyps.  Tap over the affected sinus to check for signs of infection.  View the inside of your sinuses using an imaging device that has a light attached (endoscope).  If your health care provider suspects that you have chronic sinusitis, you may also:  Be tested for allergies.  Have a sample of mucus taken from your nose (nasal culture) and checked for bacteria.  Have a mucus sample examined to see if your sinusitis is related to an allergy.  If your sinusitis does not respond to treatment and it lasts longer than 8 weeks, you may have an MRI or CT scan to check your sinuses. These scans also help to determine  how severe your infection is. In rare cases, a bone biopsy may be done to rule out more serious types of fungal sinus disease. How is this treated? Treatment for sinusitis depends on the cause and whether your condition is chronic or acute. If a virus is causing your sinusitis, your symptoms will go away on their own within 10 days. You may be given medicines to relieve your symptoms, including:  Topical nasal decongestants. They shrink swollen nasal passages and let mucus drain from your  sinuses.  Antihistamines. These drugs block inflammation that is triggered by allergies. This can help to ease swelling in your nose and sinuses.  Topical nasal corticosteroids. These are nasal sprays that ease inflammation and swelling in your nose and sinuses.  Nasal saline washes. These rinses can help to get rid of thick mucus in your nose.  If your condition is caused by bacteria, you will be given an antibiotic medicine. If your condition is caused by a fungus, you will be given an antifungal medicine. Surgery may be needed to correct underlying conditions, such as narrow nasal passages. Surgery may also be needed to remove polyps. Follow these instructions at home: Medicines  Take, use, or apply over-the-counter and prescription medicines only as told by your health care provider. These may include nasal sprays.  If you were prescribed an antibiotic medicine, take it as told by your health care provider. Do not stop taking the antibiotic even if you start to feel better. Hydrate and Humidify  Drink enough water to keep your urine clear or pale yellow. Staying hydrated will help to thin your mucus.  Use a cool mist humidifier to keep the humidity level in your home above 50%.  Inhale steam for 10-15 minutes, 3-4 times a day or as told by your health care provider. You can do this in the bathroom while a hot shower is running.  Limit your exposure to cool or dry air. Rest  Rest as much as possible.  Sleep with your head raised (elevated).  Make sure to get enough sleep each night. General instructions  Apply a warm, moist washcloth to your face 3-4 times a day or as told by your health care provider. This will help with discomfort.  Wash your hands often with soap and water to reduce your exposure to viruses and other germs. If soap and water are not available, use hand sanitizer.  Do not smoke. Avoid being around people who are smoking (secondhand smoke).  Keep all  follow-up visits as told by your health care provider. This is important. Contact a health care provider if:  You have a fever.  Your symptoms get worse.  Your symptoms do not improve within 10 days. Get help right away if:  You have a severe headache.  You have persistent vomiting.  You have pain or swelling around your face or eyes.  You have vision problems.  You develop confusion.  Your neck is stiff.  You have trouble breathing. This information is not intended to replace advice given to you by your health care provider. Make sure you discuss any questions you have with your health care provider. Document Released: 07/21/2005 Document Revised: 03/16/2016 Document Reviewed: 05/16/2015 Elsevier Interactive Patient Education  [employer].

## 2018-07-17 ENCOUNTER — Ambulatory Visit (HOSPITAL_COMMUNITY)
Admission: EM | Admit: 2018-07-17 | Discharge: 2018-07-17 | Disposition: A | Discharge status: Home / Self Care | Payer: Medicare Other | Attending: Family Medicine | Admitting: Family Medicine

## 2018-07-17 ENCOUNTER — Encounter (HOSPITAL_COMMUNITY): Payer: Self-pay | Admitting: Emergency Medicine

## 2018-07-17 DIAGNOSIS — M546 Pain in thoracic spine: Secondary | ICD-10-CM | POA: Diagnosis not present

## 2018-07-17 LAB — POCT URINALYSIS DIP (DEVICE)
BILIRUBIN URINE: NEGATIVE
Glucose, UA: NEGATIVE mg/dL
KETONES UR: 40 mg/dL — AB
Leukocytes, UA: NEGATIVE
Nitrite: NEGATIVE
PH: 6.5 (ref 5.0–8.0)
Protein, ur: NEGATIVE mg/dL
Specific Gravity, Urine: 1.015 (ref 1.005–1.030)
Urobilinogen, UA: 0.2 mg/dL (ref 0.0–1.0)

## 2018-07-17 MED ORDER — KETOROLAC TROMETHAMINE 30 MG/ML IJ SOLN: 30.0000 mg | Freq: Once | INTRAMUSCULAR | Status: DC

## 2018-07-17 MED ORDER — CYCLOBENZAPRINE HCL 10 MG PO TABS: 5.0000 mg | ORAL_TABLET | Freq: Every day | ORAL | 0 refills | Status: DC

## 2018-07-17 MED ORDER — DICLOFENAC SODIUM 1 % TD GEL: 4.0000 g | Freq: Four times a day (QID) | TRANSDERMAL | 0 refills | Status: DC

## 2018-07-17 NOTE — ED Provider Notes (Signed)
[place]    CSN: [id number] Arrival date & time: 07/17/18  1039     History   Chief Complaint Chief Complaint  Patient presents with  . Back Pain    HPI Christine Livingston is a 67 y.o. female.   Patient is a 67 year old female past medical history of hypertension, WPW.  She is also had recent hysterectomy.  She presents with left midthoracic paravertebral muscle tenderness.  This been present for approximately 3 to 4 days.  She has been using heat, massage, ibuprofen and Tylenol without much relief.  She is also been using tenacity and her daughter prescribed.  The pain is worse at night when she is in bed trying to sleep.  She describes it as sharp and stabbing at times.  She did have recent upper respiratory illness and her husband recently was in the hospital.  She was sleeping in the hospital chair.  The pain does not radiate.  There is no numbness or tingling.  She denies any fever, chills, night sweats.  ROS per HPI      Past Medical History:  Diagnosis Date  . Constipation   . Hemorrhoid    banding  . Hypertension   . SVD (spontaneous vaginal delivery)    x 1  . WPW (Wolff-Parkinson-White syndrome)    occasional fast heart beat, pt coughs and goes back to normal, no meds    Patient Active Problem List   Diagnosis Date Noted  . Thoracic back pain 06/21/2018  . Post-menopause 06/21/2018  . S/P hysterectomy with oophorectomy 05/11/2018  . Prolapsed uterus 08/25/2017  . External hemorrhoids 01/07/2017  . Wolff-Parkinson-White (WPW) syndrome 01/07/2017  . Undiagnosed cardiac murmurs 03/15/2016  . History of colonic polyps 01/30/2015  . WOLFF (WOLFE)-PARKINSON-WHITE (WPW) SYNDROME 02/03/2007  . Essential hypertension 02/01/2007    Past Surgical History:  Procedure Laterality Date  . ANTERIOR AND POSTERIOR REPAIR N/A 05/11/2018   Procedure: ANTERIOR (CYSTOCELE) REPAIR;  Surgeon: Tyson Dense, MD;  Location: [facility];  Service: Gynecology;   Laterality: N/A;  . COLONOSCOPY     polyps  . CYSTOSCOPY  05/11/2018   Procedure: CYSTOSCOPY;  Surgeon: Tyson Dense, MD;  Location: [facility];  Service: Gynecology;;  . Consuela Mimes N/A 05/11/2018   Procedure: Consuela Mimes;  Surgeon: Bjorn Loser, MD;  Location: [facility];  Service: Urology;  Laterality: N/A;  . laparoscopic drainage of ovarian cyst  1994  . LAPAROSCOPIC VAGINAL HYSTERECTOMY WITH SALPINGO OOPHORECTOMY Bilateral 05/11/2018   Procedure: LAPAROSCOPIC ASSISTED VAGINAL HYSTERECTOMY WITH SALPINGO OOPHORECTOMY, MCCALL'S CULDOPLASTY;  Surgeon: Tyson Dense, MD;  Location: [facility];  Service: Gynecology;  Laterality: Bilateral;  . PUBOVAGINAL SLING N/A 05/11/2018   Procedure: Gaynelle Arabian;  Surgeon: Bjorn Loser, MD;  Location: [facility];  Service: Urology;  Laterality: N/A;    OB History   No obstetric history on file.      Home Medications    Prior to Admission medications   Medication Sig Start Date End Date Taking? Authorizing Provider  Biotin w/ Vitamins C & E (HAIR SKIN & NAILS GUMMIES PO) Take 2 each by mouth daily.    [provider]  Calcium Carb-Cholecalciferol (CALCIUM 600/VITAMIN D3 PO) Take 2 tablets by mouth daily.    [provider]  cholecalciferol (VITAMIN D3) 25 MCG (1000 UT) tablet Take 4,000 Units by mouth daily.    [provider]  cyclobenzaprine (FLEXERIL) 10 MG tablet Take 0.5 tablets (5 mg total) by mouth at bedtime. 07/17/18  Angelene Rome A, NP  diclofenac sodium (VOLTAREN) 1 % GEL Apply 4 g topically 4 (four) times daily. 07/17/18   Loura Halt A, NP  lisinopril (PRINIVIL,ZESTRIL) 20 MG tablet Take 1 tablet a day by mouth daily. Patient taking differently: Take 20 mg by mouth daily.  03/25/18   Marletta Lor, MD  meloxicam (MOBIC) 15 MG tablet Take 15 mg by mouth daily.    [provider]  nitrofurantoin (MACRODANTIN) 100 MG capsule Take 100 mg by mouth daily.    [provider]    tizanidine (ZANAFLEX) 2 MG capsule Take 1 capsule (2 mg total) by mouth 3 (three) times daily as needed for muscle spasms. 06/21/18   Vivi Barrack, MD  Wheat Dextrin (BENEFIBER PO) Take by mouth daily.    [provider]    Family History Family History  Problem Relation Age of Onset  . Heart disease Mother   . Heart failure Mother   . Hypertension Father   . Diabetes Sister   . Hypertension Sister   . Diabetes Paternal Grandmother   . Breast cancer Paternal Aunt   . Pancreatic cancer Paternal Uncle     Social History Social History   Tobacco Use  . Smoking status: Former Smoker    Packs/day: 1.00    Types: Cigarettes    Last attempt to quit: 10/28/1980    Years since quitting: 37.7  . Smokeless tobacco: Never Used  Substance Use Topics  . Alcohol use: Yes    Alcohol/week: 0.0 standard drinks    Comment: occasional wine couple times a month  . Drug use: Never     Allergies   Bactrim [sulfamethoxazole-trimethoprim] and Cephalexin   Review of Systems Review of Systems   Physical Exam Triage Vital Signs ED Triage Vitals  Enc Vitals Group     BP 07/17/18 1136 (!) 181/96     Pulse Rate 07/17/18 1136 86     Resp 07/17/18 1136 18     Temp 07/17/18 1136 98.2 F (36.8 C)     Temp Source 07/17/18 1136 Oral     SpO2 07/17/18 1136 95 %     Weight --      Height --      Head Circumference --      Peak Flow --      Pain Score 07/17/18 1137 6     Pain Loc --      Pain Edu? --      Excl. in [place]? --    No data found.  Updated Vital Signs BP (!) 181/96 (BP Location: Right Arm)   Pulse 86   Temp 98.2 F (36.8 C) (Oral)   Resp 18   LMP  (LMP Unknown)   SpO2 95%   Visual Acuity Right Eye Distance:   Left Eye Distance:   Bilateral Distance:    Right Eye Near:   Left Eye Near:    Bilateral Near:     Physical Exam Vitals signs reviewed.  Constitutional:      General: She is not in acute distress.    Appearance: Normal appearance. She is not  ill-appearing or toxic-appearing.  HENT:     Head: Normocephalic and atraumatic.  Cardiovascular:     Rate and Rhythm: Normal rate and regular rhythm.  Pulmonary:     Effort: Pulmonary effort is normal.     Breath sounds: Normal breath sounds.  Musculoskeletal: Normal range of motion.        General: Tenderness  present.     Comments: Tenderness to palpation of the left thoracic paravertebral muscles. Mild redness and irritation from heating pad. No rash.   Skin:    General: Skin is warm and dry.  Neurological:     General: No focal deficit present.     Mental Status: She is alert.  Psychiatric:        Mood and Affect: Mood normal.      UC Treatments / Results  Labs (all labs ordered are listed, but only abnormal results are displayed) Labs Reviewed  POCT URINALYSIS DIP (DEVICE) - Abnormal; Notable for the following components:      Result Value   Ketones, ur 40 (*)    Hgb urine dipstick SMALL (*)    All other components within normal limits    EKG None  Radiology No results found.  Procedures Procedures (including critical care time)  Medications Ordered in UC Medications - No data to display  Initial Impression / Assessment and Plan / UC Course  I have reviewed the triage vital signs and the nursing notes.  Pertinent labs & imaging results that were available during my care of the patient were reviewed by me and considered in my medical decision making (see chart for details).    Urine showed trace blood which I believe is non significant Signs of infection Most likely muscle weakness, spasm. She has not been as active and sleeping in a hospital chair.  Will treat with Voltaren gel and Flexeril for muscle relaxant. She already has tizanidine for muscle relaxant.  I told her she could try to increase the dose of that first before using the Flexeril.  She agrees. No concern for shingles Instructed she is not better by Monday to follow-up with her doctor Final  Clinical Impressions(s) / UC Diagnoses   Final diagnoses:  Acute left-sided thoracic back pain     Discharge Instructions     Voltaren gel to apply to the area 4 times a day as needed for pain inflammation Flexeril to see if this helps with the muscle spasms Be aware the Flexeril will make you drowsy. Heat/stretching/gentle massage could help. For continued or worsening symptoms please follow-up with your primary care provider on Monday    ED Prescriptions    Medication Sig Dispense Auth. Provider   cyclobenzaprine (FLEXERIL) 10 MG tablet Take 0.5 tablets (5 mg total) by mouth at bedtime. 20 tablet Avianna Moynahan A, NP   diclofenac sodium (VOLTAREN) 1 % GEL Apply 4 g topically 4 (four) times daily. 1 Tube Loura Halt A, NP     Controlled Substance Prescriptions [place] Controlled Substance Registry consulted? Not Applicable   Orvan July, NP 07/18/18 1006

## 2018-07-17 NOTE — Discharge Instructions (Addendum)
Voltaren gel to apply to the area 4 times a day as needed for pain inflammation Flexeril to see if this helps with the muscle spasms Be aware the Flexeril will make you drowsy. Heat/stretching/gentle massage could help. For continued or worsening symptoms please follow-up with your primary care provider on Monday

## 2018-07-17 NOTE — ED Triage Notes (Signed)
Pt here for mid back pain that is spasm in nature

## 2018-07-19 ENCOUNTER — Ambulatory Visit: Payer: Medicare Other | Admitting: Family Medicine

## 2018-07-19 ENCOUNTER — Encounter: Payer: Self-pay | Admitting: Family Medicine

## 2018-07-19 VITALS — BP 138/84 | HR 85 | Temp 98.3°F | Ht 67.0 in | Wt 165.6 lb

## 2018-07-19 DIAGNOSIS — M546 Pain in thoracic spine: Secondary | ICD-10-CM | POA: Diagnosis not present

## 2018-07-19 DIAGNOSIS — M791 Myalgia, unspecified site: Secondary | ICD-10-CM | POA: Diagnosis not present

## 2018-07-19 MED ORDER — TIZANIDINE HCL 4 MG PO CAPS: 4.0000 mg | ORAL_CAPSULE | Freq: Three times a day (TID) | ORAL | 1 refills | Status: DC | PRN

## 2018-07-19 MED ORDER — MELOXICAM 15 MG PO TABS: 15.0000 mg | ORAL_TABLET | Freq: Every day | ORAL | 3 refills | Status: DC

## 2018-07-19 MED ORDER — METHYLPREDNISOLONE ACETATE 80 MG/ML IJ SUSP
80.0000 mg | Freq: Once | INTRAMUSCULAR | Status: AC
  Administered 2018-07-19: 80 mg via INTRAMUSCULAR

## 2018-07-19 NOTE — Progress Notes (Signed)
   Subjective:  Christine Livingston is a 67 y.o. female who presents today for same-day appointment with a chief complaint of back pain.   HPI:  Back Pain, Acute problem Started about a week ago. Worsened over that time. Went to Urgent Care a couple of days ago and was prescribed Flexeril and Voltaren gel.  This is helped modestly.  She has been taking Mobic and Advil.  She has also been taking her Zanaflex with modest improvement.  No weakness or numbness.  Pain does not radiate.  Located in mid thoracic left back.  Worse with certain movements.  No other obvious alleviating or aggravating factors.  ROS: Per HPI  PMH: She reports that she quit smoking about 37 years ago. Her smoking use included cigarettes. She smoked 1.00 pack per day. She has never used smokeless tobacco. She reports current alcohol use. She reports that she does not use drugs.  Objective:  Physical Exam: BP 138/84 (BP Location: Left Arm, Patient Position: Sitting, Cuff Size: Normal)   Pulse 85   Temp 98.3 F (36.8 C) (Oral)   Ht 5\' 7"  (1.702 m)   Wt 165 lb 9.6 oz (75.1 kg)   LMP  (LMP Unknown)   SpO2 97%   BMI 25.94 kg/m   Gen: NAD, resting comfortably MSK: -Back: No deformities.  Mostly tender to palpation along the left thoracic paraspinal muscles.  Full range of motion.  No rashes or skin lesions. -Lower extremities: Strength 5 out of 5 throughout.  Neurovascular intact distally.  Trigger Point Injection Procedure Note:  Pre-operative diagnosis: trigger point pain  Post-operative diagnosis: trigger point pain  After risks and benefits were explained including bleeding, infection, worsening of the pain, damage to the area being injected, weakness, allergic reaction to medications, vascular injection, and nerve damage, verbal consent was obtained.  All questions were answered.    The area of the trigger point was identified and the skin prepped three times with alcohol and the alcohol allowed to dry.  Next,  a 25 gauge 1 inch needle was placed in the area of the trigger point.  Once reproduction of the pain was elicited and negative aspiration confirmed, the trigger point was injected and the needle removed.    The patient did tolerate the procedure well and there were no complications.    Medication used: 31mL of 2% lidocaine without epinephrine  Trigger points injected: 1    Trigger point(s) location(s):  Left thoracic paraspinal muscle group  Assessment/Plan:  Thoracic Back Pain No red flags.  Likely muscular spasm.  Trigger point injection performed today.  See above procedure note.  Also give 80 mg of IM Depo-Medrol.  She will continue Mobic 15 mg daily and Zanaflex 4 mg 3 times daily as needed.  She will also continue topical Voltaren gel.  Patient with modest improvement from the trigger point injection while in office today.  Discussed activities to avoid.  Discussed reasons to return to care.  Follow-up as needed.  Algis Greenhouse. Jerline Pain, MD 07/19/2018 10:05 AM

## 2018-07-19 NOTE — Addendum Note (Signed)
Addended by: Elmer Bales on: 07/19/2018 02:02 PM   Modules accepted: Orders

## 2018-07-19 NOTE — Patient Instructions (Addendum)
It was very nice to see you today!  STOP taking advil. You should not take this with the mobic.  We gave you a trigger point injection today.  We will also give you a cortisone shot today.  Please continue taking the Mobic 15 mg daily, Zanaflex 4 mg 3 times daily as needed, and topical Voltaren gel 4 times daily as needed.  Please let me know if your symptoms worsen or do not improve.  Please see Dr Paulla Fore soon if this becomes a recurring issue.   Take care, Dr Jerline Pain

## 2018-07-22 ENCOUNTER — Telehealth: Payer: Self-pay | Admitting: Family Medicine

## 2018-07-22 NOTE — Telephone Encounter (Signed)
See note  Copied from [place] #[id number]. Topic: General - Other >> Jul 22, 2018  1:10 PM Carolyn Stare wrote: Pt call to say she think tiZANidine (ZANAFLEX) 4 MG capsule  is causing her to have loose stool                                               She said she was given cyclobenzaprine (FLEXERIL) 10 MG tablet by the [facility]  and is asking if it will be ok to take it

## 2018-07-22 NOTE — Telephone Encounter (Signed)
Called patient and advised of above.  Patient verbalized understanding.

## 2018-07-22 NOTE — Telephone Encounter (Signed)
Ok to take flexeril. But she should make sure she is not mixing it with zanaflex.  Algis Greenhouse. Jerline Pain, MD 07/22/2018 3:07 PM

## 2018-07-22 NOTE — Telephone Encounter (Signed)
Please advise 

## 2018-07-26 ENCOUNTER — Ambulatory Visit: Payer: Medicare Other | Admitting: Family Medicine

## 2018-07-26 ENCOUNTER — Encounter: Payer: Self-pay | Admitting: Family Medicine

## 2018-07-26 VITALS — BP 144/82 | HR 89 | Temp 97.7°F | Ht 67.0 in | Wt 168.0 lb

## 2018-07-26 DIAGNOSIS — M546 Pain in thoracic spine: Secondary | ICD-10-CM | POA: Diagnosis not present

## 2018-07-26 DIAGNOSIS — R350 Frequency of micturition: Secondary | ICD-10-CM | POA: Diagnosis not present

## 2018-07-26 LAB — POC URINALSYSI DIPSTICK (AUTOMATED)
BILIRUBIN UA: NEGATIVE
Blood, UA: NEGATIVE
GLUCOSE UA: NEGATIVE
KETONES UA: NEGATIVE
LEUKOCYTES UA: NEGATIVE
Nitrite, UA: NEGATIVE
Protein, UA: NEGATIVE
Spec Grav, UA: 1.015 (ref 1.010–1.025)
Urobilinogen, UA: 0.2 E.U./dL
pH, UA: 6 (ref 5.0–8.0)

## 2018-07-26 NOTE — Assessment & Plan Note (Addendum)
Improving.  No red flags.  Continue Flexeril and Mobic for the next couple weeks.  Will stop Zanaflex as it may have caused her diarrhea.  Follow-up with sports medicine if not improving.

## 2018-07-26 NOTE — Progress Notes (Signed)
   Subjective:  Christine Livingston is a 67 y.o. female who presents today for same-day appointment with a chief complaint of urinary frequency.   HPI:  Urinary Frequency, Acute problem Started 2 days ago. Stable over that time. Associated with mild dysuria.  She has also had some back pain but is not sure if this is related.  No fevers or chills.  No treatments tried.  Symptoms are not similar to prior UTIs.  Back Pain, established problem, improving Last seen about a week ago. Was given a trigger point injection and an IM injection of 80 mg Depo-Medrol.  Symptoms have significantly presents her last visit, how ever still has persistent left thoracic back pain.  No urinary retention.  No urinary incontinence.  Medications seem to be helping.  She had some diarrhea with Zanaflex and has not taken it since.  She is taking Flexeril 5 mg which seems to be helping.  She has been able to sleep through the night which she was not able to do before.  No other obvious alleviating or aggravating factors.  ROS: Per HPI  PMH: She reports that she quit smoking about 37 years ago. Her smoking use included cigarettes. She smoked 1.00 pack per day. She has never used smokeless tobacco. She reports current alcohol use. She reports that she does not use drugs.  Objective:  Physical Exam: BP (!) 144/82 (BP Location: Left Arm, Patient Position: Sitting, Cuff Size: Normal)   Pulse 89   Temp 97.7 F (36.5 C) (Oral)   Ht 5\' 7"  (1.702 m)   Wt 168 lb (76.2 kg)   LMP  (LMP Unknown)   SpO2 99%   BMI 26.31 kg/m   Gen: NAD, resting comfortably CV: RRR with no murmurs appreciated Pulm: NWOB, CTAB with no crackles, wheezes, or rhonchi GI: Normal bowel sounds present. Soft, Nontender, Nondistended. MSK:  -Back: No CVA tenderness.  Mildly tender to palpation along left paraspinal muscles. Skin: Warm, dry Neuro: Grossly normal, moves all extremities Psych: Normal affect and thought content  Results for orders  placed or performed in visit on 07/26/18 (from the past 24 hour(s))  POCT Urinalysis Dipstick (Automated)     Status: None   Collection Time: 07/26/18  8:04 AM  Result Value Ref Range   Color, UA yellow    Clarity, UA clear    Glucose, UA Negative Negative   Bilirubin, UA Negative    Ketones, UA Negative    Spec Grav, UA 1.015 1.010 - 1.025   Blood, UA Negative    pH, UA 6.0 5.0 - 8.0   Protein, UA Negative Negative   Urobilinogen, UA 0.2 0.2 or 1.0 E.U./dL   Nitrite, UA Negative    Leukocytes, UA Negative Negative    Assessment/Plan:  Urinary frequency UA not consistent with UTI.  Will check urine culture.  Possibly medication side effect.  Discussed reasons to return to care and seek emergent care.  Thoracic back pain Improving.  No red flags.  Continue Flexeril and Mobic for the next couple weeks.  Will stop Zanaflex as it may have caused her diarrhea.  Follow-up with sports medicine if not improving.    Algis Greenhouse. Jerline Pain, MD 07/26/2018 8:46 AM

## 2018-07-26 NOTE — Patient Instructions (Signed)
It was very nice to see you today!  We will call you in 2-3 days with the results of your urine culture.  Please let me know if your symptoms worsen in anyway.  Come back to see Dr Paulla Fore soon.   Take care, Dr Jerline Pain

## 2018-07-26 NOTE — Progress Notes (Signed)
poct

## 2018-07-27 LAB — URINE CULTURE
MICRO NUMBER:: 91533388
RESULT: NO GROWTH
SPECIMEN QUALITY:: ADEQUATE

## 2018-07-29 NOTE — Progress Notes (Signed)
Dr Marigene Ehlers interpretation of your lab work:  Good news! Your urine culture was normal. You do not have a UTI. We do not need to start any antibiotics. Please let me know if your symptoms do not improve over the next couple of weeks.    If you have any additional questions, please give [place] a call or send [place] a message through [place].  Take care, Dr Jerline Pain

## 2018-08-09 ENCOUNTER — Ambulatory Visit: Payer: Medicare Other | Admitting: Sports Medicine

## 2018-08-09 ENCOUNTER — Encounter: Payer: Self-pay | Admitting: Sports Medicine

## 2018-08-09 VITALS — BP 160/98 | HR 92 | Ht 67.0 in | Wt 166.4 lb

## 2018-08-09 DIAGNOSIS — M546 Pain in thoracic spine: Secondary | ICD-10-CM

## 2018-08-09 DIAGNOSIS — R29898 Other symptoms and signs involving the musculoskeletal system: Secondary | ICD-10-CM

## 2018-08-09 DIAGNOSIS — M24551 Contracture, right hip: Secondary | ICD-10-CM

## 2018-08-09 NOTE — Progress Notes (Signed)
Christine Livingston. Christine Livingston, [place] at [facility] [phone]  Christine Livingston - 68 y.o. female MRN [id number]  Date of birth: 06-09-1951  Visit Date:   PCP: Vivi Barrack, MD   Referred by: Vivi Barrack, MD   SUBJECTIVE:  Chief Complaint  Patient presents with  . Initial Assessment  . T spine pain    HPI: Patient presents with several years of worsening lower thoracic/upper lumbar pain.  Pain is reported as usually mild but does worsen because spasming and stabbing pain in her back.  This does seem to be associated with her stress.  She has been using heat, ice, warm water, Zanaflex, trigger point injections, meloxicam, physical therapy and home exercise program.  Over the past year things have worsened and she does report saving like it is associated with her stress level which has been elevated especially since having a hysterectomy and falling and breaking her ankle.  She denies any significant prior injuries but has been dealing with this for over a decade.  REVIEW OF SYSTEMS: Reports, Some intermittent night time disturbances. Denies fevers, chills, or night sweats. Denies unexplained weight loss. Denies personal history of cancer. Denies changes in bowel or bladder habits. Denies recent unreported falls. Denies new or worsening dyspnea or wheezing. Denies headaches or dizziness.  Denies numbness, tingling or weakness  In the extremities.  Denies dizziness or presyncopal episodes Denies lower extremity edema   HISTORY:  Prior history reviewed and updated per electronic medical record.  Social History   Occupational History  . Not on file  Tobacco Use  . Smoking status: Former Smoker    Packs/day: 1.00    Types: Cigarettes    Last attempt to quit: 10/28/1980    Years since quitting: 37.8  . Smokeless tobacco: Never Used  Substance and Sexual Activity  . Alcohol use: Yes    Alcohol/week: 0.0 standard drinks    Comment:  occasional wine couple times a month  . Drug use: Never  . Sexual activity: Not on file   Social History   Social History Narrative  . Not on file     Past Medical History:  Diagnosis Date  . Constipation   . Hemorrhoid    banding  . Hypertension   . SVD (spontaneous vaginal delivery)    x 1  . WPW (Wolff-Parkinson-White syndrome)    occasional fast heart beat, pt coughs and goes back to normal, no meds     Past Surgical History:  Procedure Laterality Date  . ANTERIOR AND POSTERIOR REPAIR N/A 05/11/2018   Procedure: ANTERIOR (CYSTOCELE) REPAIR;  Surgeon: Tyson Dense, MD;  Location: [facility];  Service: Gynecology;  Laterality: N/A;  . COLONOSCOPY     polyps  . CYSTOSCOPY  05/11/2018   Procedure: CYSTOSCOPY;  Surgeon: Tyson Dense, MD;  Location: [facility];  Service: Gynecology;;  . Consuela Mimes N/A 05/11/2018   Procedure: Consuela Mimes;  Surgeon: Bjorn Loser, MD;  Location: [facility];  Service: Urology;  Laterality: N/A;  . laparoscopic drainage of ovarian cyst  1994  . LAPAROSCOPIC VAGINAL HYSTERECTOMY WITH SALPINGO OOPHORECTOMY Bilateral 05/11/2018   Procedure: LAPAROSCOPIC ASSISTED VAGINAL HYSTERECTOMY WITH SALPINGO OOPHORECTOMY, MCCALL'S CULDOPLASTY;  Surgeon: Tyson Dense, MD;  Location: [facility];  Service: Gynecology;  Laterality: Bilateral;  . PUBOVAGINAL SLING N/A 05/11/2018   Procedure: Gaynelle Arabian;  Surgeon: Bjorn Loser, MD;  Location: [facility];  Service: Urology;  Laterality: N/A;    family history  includes Breast cancer in her paternal aunt; Diabetes in her paternal grandmother and sister; Heart disease in her mother; Heart failure in her mother; Hypertension in her father and sister; Pancreatic cancer in her paternal uncle.  DATA OBTAINED & REVIEWED:  Recent Labs    05/05/18 1140  CALCIUM 9.4   Problem  Thoracic Back Pain   CT scan 01/28/2018 of the abdomen and pelvis reviewed with evidence of significant spondylosis at L4-5 and  L5-S1.  SI joints are symmetrically open.  Incidental aortic atherosclerotic changes appreciated.    No specialty comments available.  OBJECTIVE:  VS:  HT:5\' 7"  (170.2 cm)   WT:166 lb 6.4 oz (75.5 kg)  BMI:26.06    BP:(!) 160/98  HR:92bpm  TEMP: ( )  RESP:97 %   PHYSICAL EXAM: CONSTITUTIONAL: Well-developed, Well-nourished and In no acute distress PSYCHIATRIC : Alert & appropriately interactive. and Not depressed or anxious appearing. RESPIRATORY : No increased work of breathing and Trachea Midline EYES : Pupils are equal., EOM intact without nystagmus. and No scleral icterus.  VASCULAR EXAM : Warm and well perfused NEURO: Strength: Normal strength in associated myotomes to manual muscle testing.  MSK Exam:   lumbar spine, thoracic spine  Well aligned No significant deformity. No overlying skin changes No focal bony tenderness No swelling Negative straight leg raise bilaterally.  She has good hip mobility with normal internal and external range of motion.  She has some paraspinal muscle spasms along the right greater than left thoracolumbar junction.  Thoracic rotation is limited to the right.  Left hip abduction strength is 5/5.  Right hip abduction strength is 4/5.  TFL recruitment pattern bilaterally.    ASSESSMENT  1. Right hip flexor tightness   2. Thoracic back pain, unspecified back pain laterality, unspecified chronicity   3. Weakness of right hip     PLAN:  Pertinent additional documentation may be included in corresponding procedure notes, imaging studies, problem based documentation and patient instructions.  Procedures:  Home Therapeutic exercises prescribed per procedure note.  Medications:  No orders of the defined types were placed in this encounter.   Discussion/Instructions: No problem-specific Assessment & Plan notes found for this encounter.  THERAPEUTIC EXERCISE: Discussed the foundation of treatment for this condition is physical therapy and/or  daily (5-6 days/week) therapeutic exercises, focusing on core strengthening, coordination, neuromuscular control/reeducation.  Home Therapeutic exercises prescribed today per procedure note. Links to [employer] provided today per Patient Instructions.  These exercises were developed by Minerva Ends, DC with a strong emphasis on core neuromuscular reducation and postural realignment through body-weight exercises. Discussed the underlying features of tight hip flexors leading to crouched, fetal like position that results in spinal column compression.  Including lumbar hyperflexion with hypermobility, thoracic flexion with restrictive rotation and cervical lordosis reversal Discussed red flag symptoms that warrant earlier emergent evaluation and patient voices understanding. Activity modifications and the importance of avoiding exacerbating activities (limiting pain to no more than a 4 / 10 during or following activity) recommended and discussed.  Prior CT scan did show moderate degree of lower lumbar spondylosis.  She will benefit from a revised home exercise program and reassurance was provided today given no signs of neurologic compromise.  We will plan to follow-up with her in 8 weeks and can consider advancing her home therapeutic exercises and/or referring to physical therapy.  If any lack of improvement: consider further diagnostic evaluation with MRI of the lumbar spine.  At follow up will plan: to consider initial  osteopathic manipulation and to advance therapeutic exercises   Return in about 8 weeks (around 10/04/2018).          [name], [facility] Physician

## 2018-08-09 NOTE — Progress Notes (Signed)
PROCEDURE NOTE: THERAPEUTIC EXERCISES (97110) 15 minutes spent for Therapeutic exercises as below and as referenced in the AVS.  This included exercises focusing on stretching, strengthening, with significant focus on eccentric aspects.   Proper technique shown and discussed handout in great detail with ATC.  All questions were discussed and answered.   Long term goals include an improvement in range of motion, strength, endurance as well as avoiding reinjury. Frequency of visits is one time as determined during today's  office visit. Frequency of exercises to be performed is as per handout.  EXERCISES REVIEWED: Archie Balboa Exercises Thoracic Mobility Hip ABduction strengthening with focus on [employer]

## 2018-08-09 NOTE — Patient Instructions (Addendum)
Please perform the exercise program that we have prepared for you and gone over in detail on a daily basis.  In addition to the handout you were provided you can access your program through: [email or web address]   Your unique program code is:   [facility]     Also check out [employer]" which is a program developed by Dr. Minerva Ends.   There are links to a couple of his YouTube Videos below and I would like to see you performing one of his videos 5-6 days per week.  It is best to do these exercises first thing in the morning.  They will give you a good jumpstart here today and start normalizing the way you move.  A good intro video is: "Independence from Pain 7-minute Video" - [email or web address]   A more advanced video is: Interior and spatial designer original 12 minutes" - [email or web address]-[email or web address]  Exercises that focus more on the neck are as below: Dr. Archie Balboa with [place] teaching neck and shoulder details Part 1 - https://youtu.be/[email or web address] Part 2 Dr. Archie Balboa with [facility] quick routine to practice daily - https://youtu.be/[email or web address]  Do not try to attempt the entire video when first beginning.  Try breaking of each exercise that he goes into shorter segments.  In other words, if they perform an exercise for 45 seconds, start with 15 seconds and rest and then resume when they begin the new activity.  If you work your way up to being able to do these videos without having to stop, I expect you will see significant improvements in your pain.  If you enjoy his videos and would like to find out more you can look on his website: [email or web address]  He has a workout streaming option as well as a DVD set available for purchase.  Amazon has the best price for his DVDs.

## 2018-10-05 ENCOUNTER — Encounter: Payer: Self-pay | Admitting: Sports Medicine

## 2018-10-05 ENCOUNTER — Ambulatory Visit: Payer: Medicare Other | Admitting: Sports Medicine

## 2018-10-05 VITALS — BP 140/86 | HR 83 | Ht 67.0 in | Wt 165.6 lb

## 2018-10-05 DIAGNOSIS — M24551 Contracture, right hip: Secondary | ICD-10-CM

## 2018-10-05 DIAGNOSIS — R29898 Other symptoms and signs involving the musculoskeletal system: Secondary | ICD-10-CM | POA: Diagnosis not present

## 2018-10-05 DIAGNOSIS — M546 Pain in thoracic spine: Secondary | ICD-10-CM

## 2018-10-05 DIAGNOSIS — M791 Myalgia, unspecified site: Secondary | ICD-10-CM | POA: Diagnosis not present

## 2018-10-05 NOTE — Progress Notes (Signed)
Christine Livingston. Christine Livingston, [place] at [facility] [phone]  Stepheni Livingston - 68 y.o. female MRN [id number]  Date of birth: Dec 03, 1950  Visit Date: October 07, 2018  PCP: Vivi Barrack, MD   Referred by: Vivi Barrack, MD  SUBJECTIVE:  Chief Complaint  Patient presents with  . Follow-up    Mid-lower back pain.  Archie Balboa.  T-spine mobility and hip aBd HEP.    HPI: Patient reports that she is showing fairly good improvement with her mid to low back pain.  She is performing her home therapeutic exercises as well as take Zanaflex and meloxicam.  She is undergone trigger point injection as well as physical therapy and overall is happy with her progress at this time.  REVIEW OF SYSTEMS: No significant nighttime awakenings due to this issue. Denies fevers, chills, recent weight gain or weight loss.  No night sweats.  Pt denies any change in bowel or bladder habits, muscle weakness, numbness or falls associated with this pain.  HISTORY:  Prior history reviewed and updated per electronic medical record.  Patient Active Problem List   Diagnosis Date Noted  . Thoracic back pain 06/21/2018    CT scan 01/28/2018 of the abdomen and pelvis reviewed with evidence of significant spondylosis at L4-5 and L5-S1.  SI joints are symmetrically open.  Incidental aortic atherosclerotic changes appreciated.   Christine Livingston Post-menopause 06/21/2018  . S/P hysterectomy with oophorectomy 05/11/2018  . Prolapsed uterus 08/25/2017  . External hemorrhoids 01/07/2017  . Wolff-Parkinson-White (WPW) syndrome 01/07/2017  . Undiagnosed cardiac murmurs 03/15/2016  . History of colonic polyps 01/30/2015    2013   . WOLFF (WOLFE)-PARKINSON-WHITE (WPW) SYNDROME 02/03/2007    Asymptomatic- incidental finding    . Essential hypertension 02/01/2007    Pt may want to switch off lisinopril in near future.      Social History   Occupational History  . Not on file  Tobacco Use    . Smoking status: Former Smoker    Packs/day: 1.00    Types: Cigarettes    Last attempt to quit: 10/28/1980    Years since quitting: 37.9  . Smokeless tobacco: Never Used  Substance and Sexual Activity  . Alcohol use: Yes    Alcohol/week: 0.0 standard drinks    Comment: occasional wine couple times a month  . Drug use: Never  . Sexual activity: Not on file   Social History   Social History Narrative  . Not on file    OBJECTIVE:  VS:  HT:5\' 7"  (170.2 cm)   WT:165 lb 9.6 oz (75.1 kg)  BMI:25.93    BP:140/86  HR:83bpm  TEMP: ( )  RESP:94 %   PHYSICAL EXAM: Adult female. No acute distress.  Alert and appropriate. She has negative straight leg raise bilaterally.  She has mild paraspinal muscle spasms.  She is able to heel and toe walk without difficulty.   ASSESSMENT:   1. Right hip flexor tightness   2. Thoracic back pain, unspecified back pain laterality, unspecified chronicity   3. Weakness of right hip   4. Trigger point     PROCEDURES:  None  PLAN:  Pertinent additional documentation may be included in corresponding procedure notes, imaging studies, problem based documentation and patient instructions.  No problem-specific Assessment & Plan notes found for this encounter.   Overall she is markedly improved.  She continues to have some bilateral paraspinal muscle tightness and spasms but is overall markedly improved.  I will have her continue with her home therapeutic exercises.  Given how well she is doing we will hold off any further manipulation at this time and if any lack of improvement she can return as needed.   No orders of the defined types were placed in this encounter.  Lab Orders  No laboratory test(s) ordered today   Imaging Orders  No imaging studies ordered today   Referral Orders  No referral(s) requested today    Return if symptoms worsen or fail to improve.          Christine Livingston, [facility] Physician

## 2018-10-07 ENCOUNTER — Encounter: Payer: Self-pay | Admitting: Sports Medicine

## 2018-10-14 LAB — HM MAMMOGRAPHY

## 2019-03-16 ENCOUNTER — Telehealth: Payer: Self-pay | Admitting: Family Medicine

## 2019-03-16 ENCOUNTER — Other Ambulatory Visit: Payer: Self-pay

## 2019-03-16 MED ORDER — LISINOPRIL 20 MG PO TABS: ORAL_TABLET | ORAL | 0 refills | Status: DC

## 2019-03-16 NOTE — Telephone Encounter (Signed)
Medication Refill - Medication: lisinopril (PRINIVIL,ZESTRIL) 20 MG tablet  Pt will be out of her rx today and needs refill asap.   Preferred Pharmacy :  [facility] [address], [place] - [place] [address] [phone] (Phone) [phone] (Fax)     Pt was advised that RX refills may take up to 3 business days. We ask that you follow-up with your pharmacy.

## 2019-03-16 NOTE — Telephone Encounter (Signed)
See note

## 2019-03-16 NOTE — Telephone Encounter (Signed)
Rx sent to pharmacy   

## 2019-04-12 ENCOUNTER — Other Ambulatory Visit: Payer: Self-pay | Admitting: Family Medicine

## 2019-05-10 ENCOUNTER — Other Ambulatory Visit: Payer: Self-pay | Admitting: Family Medicine

## 2019-05-17 ENCOUNTER — Telehealth: Payer: Self-pay | Admitting: Family Medicine

## 2019-05-17 NOTE — Telephone Encounter (Signed)
I called the patient to schedule AWV with Loma Sousa, but there was no answer and no option to leave a message. If patient calls back, please schedule Medicare Wellness Visit at next available opening.  VDM (Dee-Dee)

## 2019-06-13 ENCOUNTER — Other Ambulatory Visit: Payer: Self-pay | Admitting: Family Medicine

## 2019-06-15 ENCOUNTER — Other Ambulatory Visit: Payer: Self-pay | Admitting: Family Medicine

## 2019-06-15 NOTE — Telephone Encounter (Signed)
Last OV 07/26/18 Last refill 05/11/19 #30/0 Next OV 06/23/19  Refilling x 30 days. Pt needs to keep scheduled follow-up.

## 2019-06-23 ENCOUNTER — Encounter: Payer: Self-pay | Admitting: Family Medicine

## 2019-06-23 ENCOUNTER — Other Ambulatory Visit: Payer: Self-pay

## 2019-06-23 ENCOUNTER — Ambulatory Visit (INDEPENDENT_AMBULATORY_CARE_PROVIDER_SITE_OTHER): Payer: Medicare Other | Admitting: Family Medicine

## 2019-06-23 ENCOUNTER — Ambulatory Visit (INDEPENDENT_AMBULATORY_CARE_PROVIDER_SITE_OTHER): Payer: Medicare Other

## 2019-06-23 VITALS — BP 146/82 | Temp 97.2°F | Ht 67.0 in | Wt 169.2 lb

## 2019-06-23 DIAGNOSIS — Z1159 Encounter for screening for other viral diseases: Secondary | ICD-10-CM | POA: Diagnosis not present

## 2019-06-23 DIAGNOSIS — I10 Essential (primary) hypertension: Secondary | ICD-10-CM

## 2019-06-23 DIAGNOSIS — Z0001 Encounter for general adult medical examination with abnormal findings: Secondary | ICD-10-CM

## 2019-06-23 DIAGNOSIS — Z6826 Body mass index (BMI) 26.0-26.9, adult: Secondary | ICD-10-CM

## 2019-06-23 DIAGNOSIS — Z0000 Encounter for general adult medical examination without abnormal findings: Secondary | ICD-10-CM | POA: Diagnosis not present

## 2019-06-23 DIAGNOSIS — E663 Overweight: Secondary | ICD-10-CM

## 2019-06-23 LAB — COMPREHENSIVE METABOLIC PANEL
ALT: 17 U/L (ref 0–35)
AST: 16 U/L (ref 0–37)
Albumin: 4.6 g/dL (ref 3.5–5.2)
Alkaline Phosphatase: 77 U/L (ref 39–117)
BUN: 11 mg/dL (ref 6–23)
CO2: 27 mEq/L (ref 19–32)
Calcium: 9.5 mg/dL (ref 8.4–10.5)
Chloride: 102 mEq/L (ref 96–112)
Creatinine, Ser: 0.72 mg/dL (ref 0.40–1.20)
GFR: 80.49 mL/min (ref >60.00)
Glucose, Bld: 96 mg/dL (ref 70–99)
Potassium: 3.9 mEq/L (ref 3.5–5.1)
Sodium: 141 mEq/L (ref 135–145)
Total Bilirubin: 0.7 mg/dL (ref 0.2–1.2)
Total Protein: 6.9 g/dL (ref 6.0–8.3)

## 2019-06-23 LAB — LIPID PANEL
Cholesterol: 244 mg/dL — ABNORMAL HIGH (ref 0–200)
HDL: 50.7 mg/dL (ref >39.00)
LDL Cholesterol: 170 mg/dL — ABNORMAL HIGH (ref 0–99)
NonHDL: 193.73
Total CHOL/HDL Ratio: 5
Triglycerides: 121 mg/dL (ref 0.0–149.0)
VLDL: 24.2 mg/dL (ref 0.0–40.0)

## 2019-06-23 LAB — CBC
HCT: 43.9 % (ref 36.0–46.0)
Hemoglobin: 14.8 g/dL (ref 12.0–15.0)
MCHC: 33.7 g/dL (ref 30.0–36.0)
MCV: 86.7 fl (ref 78.0–100.0)
Platelets: 204 10*3/uL (ref 150.0–400.0)
RBC: 5.06 Mil/uL (ref 3.87–5.11)
RDW: 13.4 % (ref 11.5–15.5)
WBC: 3.9 10*3/uL — ABNORMAL LOW (ref 4.0–10.5)

## 2019-06-23 LAB — TSH: TSH: 1.26 u[IU]/mL (ref 0.35–4.50)

## 2019-06-23 MED ORDER — LISINOPRIL 20 MG PO TABS: 20.0000 mg | ORAL_TABLET | Freq: Every day | ORAL | 3 refills | Status: DC

## 2019-06-23 NOTE — Progress Notes (Signed)
Chief Complaint:  Christine Livingston is a 68 y.o. female who presents today for her annual comprehensive physical exam.    Assessment/Plan:  Essential hypertension At goal per JNC 8.  Continue lisinopril 20 mg daily.  Discussed home blood pressure monitoring with goal 140/90 or lower.  Will check CBC, C met, TSH.  Body mass index is 26.5 kg/m. / Overweight BMI Metric Follow Up - 06/23/19 0939      BMI Metric Follow Up-Please document annually   BMI Metric Follow Up  Education provided       Preventative Healthcare: Check CBC, C met, TSH, hepatitis C, and lipid panel.  Up-to-date on mammogram, colonoscopy, and bone density screenings.  Up-to-date on flu pneumonia vaccines.  Patient Counseling(The following topics were reviewed and/or handout was given):  -Nutrition: Stressed importance of moderation in sodium/caffeine intake, saturated fat and cholesterol, caloric balance, sufficient intake of fresh fruits, vegetables, and fiber.  -Stressed the importance of regular exercise.   -Substance Abuse: Discussed cessation/primary prevention of tobacco, alcohol, or other drug use; driving or other dangerous activities under the influence; availability of treatment for abuse.   -Injury prevention: Discussed safety belts, safety helmets, smoke detector, smoking near bedding or upholstery.   -Sexuality: Discussed sexually transmitted diseases, partner selection, use of condoms, avoidance of unintended pregnancy and contraceptive alternatives.   -Dental health: Discussed importance of regular tooth brushing, flossing, and dental visits.  -Health maintenance and immunizations reviewed. Please refer to Health maintenance section.  Return to care in 1 year for next preventative visit.     Subjective:  HPI:  She has no acute complaints today.   Her stable, chronic medical conditions are outlined below:   # Essential Hypertension - On lisinopril 83m daily and tolerating well - ROS: No  reported chest pain or shortness of breath  Lifestyle Diet: No specific diets or eating plans. Trying to eat healthy.  Exercise: Trying to stay active. No specific diets or exercises.   Depression screen PHQ 2/9 06/23/2019  Decreased Interest 0  Down, Depressed, Hopeless 0  PHQ - 2 Score 0  Altered sleeping 2  Tired, decreased energy 0  Change in appetite 0  Feeling bad or failure about yourself  0  Trouble concentrating 0  Moving slowly or fidgety/restless 0  Suicidal thoughts 0  PHQ-9 Score 2   Health Maintenance Due  Topic Date Due  . Hepatitis C Screening  003-29-1952 . TETANUS/TDAP  10/24/2018    ROS: Per HPI, otherwise a complete review of systems was negative.   PMH:  The following were reviewed and entered/updated in epic: Past Medical History:  Diagnosis Date  . Constipation   . Hemorrhoid    banding  . Hypertension   . SVD (spontaneous vaginal delivery)    x 1  . WPW (Wolff-Parkinson-White syndrome)    occasional fast heart beat, pt coughs and goes back to normal, no meds   Patient Active Problem List   Diagnosis Date Noted  . Thoracic back pain 06/21/2018  . Post-menopause 06/21/2018  . S/P hysterectomy with oophorectomy 05/11/2018  . Prolapsed uterus 08/25/2017  . External hemorrhoids 01/07/2017  . Wolff-Parkinson-White (WPW) syndrome 01/07/2017  . Undiagnosed cardiac murmurs 03/15/2016  . History of colonic polyps 01/30/2015  . WOLFF (WOLFE)-PARKINSON-WHITE (WPW) SYNDROME 02/03/2007  . Essential hypertension 02/01/2007   Past Surgical History:  Procedure Laterality Date  . ANTERIOR AND POSTERIOR REPAIR N/A 05/11/2018   Procedure: ANTERIOR (CYSTOCELE) REPAIR;  Surgeon: LTyson Dense MD;  Location: [facility];  Service: Gynecology;  Laterality: N/A;  . COLONOSCOPY     polyps  . CYSTOSCOPY  05/11/2018   Procedure: CYSTOSCOPY;  Surgeon: Tyson Dense, MD;  Location: [facility];  Service: Gynecology;;  . Consuela Mimes N/A 05/11/2018    Procedure: Consuela Mimes;  Surgeon: Bjorn Loser, MD;  Location: [facility];  Service: Urology;  Laterality: N/A;  . laparoscopic drainage of ovarian cyst  1994  . LAPAROSCOPIC VAGINAL HYSTERECTOMY WITH SALPINGO OOPHORECTOMY Bilateral 05/11/2018   Procedure: LAPAROSCOPIC ASSISTED VAGINAL HYSTERECTOMY WITH SALPINGO OOPHORECTOMY, MCCALL'S CULDOPLASTY;  Surgeon: Tyson Dense, MD;  Location: [facility];  Service: Gynecology;  Laterality: Bilateral;  . PUBOVAGINAL SLING N/A 05/11/2018   Procedure: Gaynelle Arabian;  Surgeon: Bjorn Loser, MD;  Location: [facility];  Service: Urology;  Laterality: N/A;    Family History  Problem Relation Age of Onset  . Heart disease Mother   . Heart failure Mother   . Hypertension Father   . Diabetes Sister   . Hypertension Sister   . Diabetes Paternal Grandmother   . Breast cancer Paternal Aunt   . Pancreatic cancer Paternal Uncle     Medications- reviewed and updated Current Outpatient Medications  Medication Sig Dispense Refill  . lisinopril (ZESTRIL) 20 MG tablet Take 1 tablet (20 mg total) by mouth daily. 90 tablet 3  . Wheat Dextrin (BENEFIBER PO) Take by mouth daily.     No current facility-administered medications for this visit.     Allergies-reviewed and updated Allergies  Allergen Reactions  . Bactrim [Sulfamethoxazole-Trimethoprim] Rash and Other (See Comments)    Leg cramp  . Cephalexin Diarrhea    Social History   Socioeconomic History  . Marital status: Married    Spouse name: Not on file  . Number of children: 1  . Years of education: Not on file  . Highest education level: Not on file  Occupational History  . Occupation: Retired     Comment: Management consultant  . Financial resource strain: Not on file  . Food insecurity    Worry: Not on file    Inability: Not on file  . Transportation needs    Medical: Not on file    Non-medical: Not on file  Tobacco Use  . Smoking status: Former Smoker    Packs/day: 1.00     Types: Cigarettes    Quit date: 10/28/1980    Years since quitting: 38.6  . Smokeless tobacco: Never Used  Substance and Sexual Activity  . Alcohol use: Yes    Alcohol/week: 0.0 standard drinks    Comment: occasional wine couple times a month  . Drug use: Never  . Sexual activity: Not on file  Lifestyle  . Physical activity    Days per week: Not on file    Minutes per session: Not on file  . Stress: Not on file  Relationships  . Social Herbalist on phone: Not on file    Gets together: Not on file    Attends religious service: Not on file    Active member of club or organization: Not on file    Attends meetings of clubs or organizations: Not on file    Relationship status: Not on file  Other Topics Concern  . 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 Wt Readings from Last 3 Encounters:  06/23/19 169 lb 3.2 oz (76.7 kg)  06/23/19 169 lb 3.2 oz (76.7 kg)  10/05/18 165 lb 9.6 oz (75.1 kg)   Gen: NAD, resting comfortably HEENT: TMs normal bilaterally. OP clear. No thyromegaly noted.  CV: RRR with no murmurs appreciated Pulm: NWOB, CTAB with no crackles, wheezes, or rhonchi GI: Normal bowel sounds present. Soft, Nontender, Nondistended. MSK: no edema, cyanosis, or clubbing noted Skin: warm, dry Neuro: CN2-12 grossly intact. Strength 5/5 in upper and lower extremities. Reflexes symmetric and intact bilaterally.  Psych: Normal affect and thought content     Saydee Zolman M. Jerline Pain, MD 06/23/2019 9:40 AM

## 2019-06-23 NOTE — Progress Notes (Signed)
I have personally reviewed the Medicare Annual Wellness Visit and agree with the assessment and plan.  Algis Greenhouse. Jerline Pain, MD 06/23/2019 9:30 AM

## 2019-06-23 NOTE — Patient Instructions (Signed)
Christine Livingston , Thank you for taking time to come for your Medicare Wellness Visit. I appreciate your ongoing commitment to your health goals. Please review the following plan we discussed and let me know if I can assist you in the future.   Screening recommendations/referrals: Colorectal Screening: up to date; last 03/30/15 with Dr. Ferdinand Lango (next due 2021) Mammogram: up to date; last 10/14/18 Bone Density: up to date; we will request the records from Dr. Royston Sinner   Vision and Dental Exams: Recommended annual ophthalmology exams for early detection of glaucoma and other disorders of the eye Recommended annual dental exams for proper oral hygiene  Vaccinations: Influenza vaccine: completed 04/22/19 Pneumococcal vaccine: up to date Tdap vaccine: recommended; Please call your insurance company to determine your out of pocket expense. You may receive this vaccine at your local pharmacy or Health Dept. Shingles vaccine:Shingrix completed   Advanced directives: Please bring a copy of your POA (Power of Attorney) and/or Living Will to your next appointment.  Goals: Recommend to drink at least 6-8 8oz glasses of water per day and consume a balanced diet rich in fresh fruits and vegetables.   Next appointment: Please schedule your Annual Wellness Visit with your Nurse Health Advisor in one year.  Preventive Care 56 Years and Older, Female Preventive care refers to lifestyle choices and visits with your health care provider that can promote health and wellness. What does preventive care include?  A yearly physical exam. This is also called an annual well check.  Dental exams once or twice a year.  Routine eye exams. Ask your health care provider how often you should have your eyes checked.  Personal lifestyle choices, including:  Daily care of your teeth and gums.  Regular physical activity.  Eating a healthy diet.  Avoiding tobacco and drug use.  Limiting alcohol use.  Practicing safe sex.   Taking low-dose aspirin every day if recommended by your health care provider.  Taking vitamin and mineral supplements as recommended by your health care provider. What happens during an annual well check? The services and screenings done by your health care provider during your annual well check will depend on your age, overall health, lifestyle risk factors, and family history of disease. Counseling  Your health care provider may ask you questions about your:  Alcohol use.  Tobacco use.  Drug use.  Emotional well-being.  Home and relationship well-being.  Sexual activity.  Eating habits.  History of falls.  Memory and ability to understand (cognition).  Work and work Statistician.  Reproductive health. Screening  You may have the following tests or measurements:  Height, weight, and BMI.  Blood pressure.  Lipid and cholesterol levels. These may be checked every 5 years, or more frequently if you are over 53 years old.  Skin check.  Lung cancer screening. You may have this screening every year starting at age 67 if you have a 30-pack-year history of smoking and currently smoke or have quit within the past 15 years.  Fecal occult blood test (FOBT) of the stool. You may have this test every year starting at age 82.  Flexible sigmoidoscopy or colonoscopy. You may have a sigmoidoscopy every 5 years or a colonoscopy every 10 years starting at age 44.  Hepatitis C blood test.  Hepatitis B blood test.  Sexually transmitted disease (STD) testing.  Diabetes screening. This is done by checking your blood sugar (glucose) after you have not eaten for a while (fasting). You may have this done every  1-3 years.  Bone density scan. This is done to screen for osteoporosis. You may have this done starting at age 34.  Mammogram. This may be done every 1-2 years. Talk to your health care provider about how often you should have regular mammograms. Talk with your health care  provider about your test results, treatment options, and if necessary, the need for more tests. Vaccines  Your health care provider may recommend certain vaccines, such as:  Influenza vaccine. This is recommended every year.  Tetanus, diphtheria, and acellular pertussis (Tdap, Td) vaccine. You may need a Td booster every 10 years.  Zoster vaccine. You may need this after age 25.  Pneumococcal 13-valent conjugate (PCV13) vaccine. One dose is recommended after age 58.  Pneumococcal polysaccharide (PPSV23) vaccine. One dose is recommended after age 15. Talk to your health care provider about which screenings and vaccines you need and how often you need them. This information is not intended to replace advice given to you by your health care provider. Make sure you discuss any questions you have with your health care provider. Document Released: 08/17/2015 Document Revised: 04/09/2016 Document Reviewed: 05/22/2015 Elsevier Interactive Patient Education  2017 [place] Prevention in the Home Falls can cause injuries. They can happen to people of all ages. There are many things you can do to make your home safe and to help prevent falls. What can I do on the outside of my home?  Regularly fix the edges of walkways and driveways and fix any cracks.  Remove anything that might make you trip as you walk through a door, such as a raised step or threshold.  Trim any bushes or trees on the path to your home.  Use bright outdoor lighting.  Clear any walking paths of anything that might make someone trip, such as rocks or tools.  Regularly check to see if handrails are loose or broken. Make sure that both sides of any steps have handrails.  Any raised decks and porches should have guardrails on the edges.  Have any leaves, snow, or ice cleared regularly.  Use sand or salt on walking paths during winter.  Clean up any spills in your garage right away. This includes oil or grease  spills. What can I do in the bathroom?  Use night lights.  Install grab bars by the toilet and in the tub and shower. Do not use towel bars as grab bars.  Use non-skid mats or decals in the tub or shower.  If you need to sit down in the shower, use a plastic, non-slip stool.  Keep the floor dry. Clean up any water that spills on the floor as soon as it happens.  Remove soap buildup in the tub or shower regularly.  Attach bath mats securely with double-sided non-slip rug tape.  Do not have throw rugs and other things on the floor that can make you trip. What can I do in the bedroom?  Use night lights.  Make sure that you have a light by your bed that is easy to reach.  Do not use any sheets or blankets that are too big for your bed. They should not hang down onto the floor.  Have a firm chair that has side arms. You can use this for support while you get dressed.  Do not have throw rugs and other things on the floor that can make you trip. What can I do in the kitchen?  Clean up any spills right away.  Avoid walking on wet floors.  Keep items that you use a lot in easy-to-reach places.  If you need to reach something above you, use a strong step stool that has a grab bar.  Keep electrical cords out of the way.  Do not use floor polish or wax that makes floors slippery. If you must use wax, use non-skid floor wax.  Do not have throw rugs and other things on the floor that can make you trip. What can I do with my stairs?  Do not leave any items on the stairs.  Make sure that there are handrails on both sides of the stairs and use them. Fix handrails that are broken or loose. Make sure that handrails are as long as the stairways.  Check any carpeting to make sure that it is firmly attached to the stairs. Fix any carpet that is loose or worn.  Avoid having throw rugs at the top or bottom of the stairs. If you do have throw rugs, attach them to the floor with carpet  tape.  Make sure that you have a light switch at the top of the stairs and the bottom of the stairs. If you do not have them, ask someone to add them for you. What else can I do to help prevent falls?  Wear shoes that:  Do not have high heels.  Have rubber bottoms.  Are comfortable and fit you well.  Are closed at the toe. Do not wear sandals.  If you use a stepladder:  Make sure that it is fully opened. Do not climb a closed stepladder.  Make sure that both sides of the stepladder are locked into place.  Ask someone to hold it for you, if possible.  Clearly mark and make sure that you can see:  Any grab bars or handrails.  First and last steps.  Where the edge of each step is.  Use tools that help you move around (mobility aids) if they are needed. These include:  Canes.  Walkers.  Scooters.  Crutches.  Turn on the lights when you go into a dark area. Replace any light bulbs as soon as they burn out.  Set up your furniture so you have a clear path. Avoid moving your furniture around.  If any of your floors are uneven, fix them.  If there are any pets around you, be aware of where they are.  Review your medicines with your doctor. Some medicines can make you feel dizzy. This can increase your chance of falling. Ask your doctor what other things that you can do to help prevent falls. This information is not intended to replace advice given to you by your health care provider. Make sure you discuss any questions you have with your health care provider. Document Released: 05/17/2009 Document Revised: 12/27/2015 Document Reviewed: 08/25/2014 Elsevier Interactive Patient Education  2017 [employer].

## 2019-06-23 NOTE — Progress Notes (Signed)
Subjective:   Christine Livingston is a 68 y.o. female who presents for Medicare Annual (Subsequent) preventive examination.  Review of Systems:   Cardiac Risk Factors include: advanced age (>41men, >37 women);hypertension    Objective:     Vitals: BP (!) 146/82 (BP Location: Left Arm, Patient Position: Sitting, Cuff Size: Normal)   Temp (!) 97.2 F (36.2 C) (Temporal)   Ht 5\' 7"  (1.702 m)   Wt 169 lb 3.2 oz (76.7 kg)   LMP  (LMP Unknown)   BMI 26.50 kg/m   Body mass index is 26.5 kg/m.  Advanced Directives 06/23/2019 05/11/2018 05/05/2018 12/20/2015 04/15/2015  Does Patient Have a Medical Advance Directive? Yes No No No Yes  Type of Advance Directive Living will;Healthcare Power of Attorney - - - -  Does patient want to make changes to medical advance directive? No - Patient declined - - - -  Copy of [place] in Chart? No - copy requested - - - -  Would patient like information on creating a medical advance directive? - No - Patient declined Yes (MAU/Ambulatory/Procedural Areas - Information given) No - patient declined information -    Tobacco Social History   Tobacco Use  Smoking Status Former Smoker  . Packs/day: 1.00  . Types: Cigarettes  . Quit date: 10/28/1980  . Years since quitting: 38.6  Smokeless Tobacco Never Used     Counseling given: Not Answered   Clinical Intake:  Pre-visit preparation completed: Yes  Pain : No/denies pain  Diabetes: No  How often do you need to have someone help you when you read instructions, pamphlets, or other written materials from your doctor or pharmacy?: 1 - Never  Interpreter Needed?: No  Information entered by :: Denman George LPN  Past Medical History:  Diagnosis Date  . Constipation   . Hemorrhoid    banding  . Hypertension   . SVD (spontaneous vaginal delivery)    x 1  . WPW (Wolff-Parkinson-White syndrome)    occasional fast heart beat, pt coughs and goes back to normal, no meds    Past Surgical History:  Procedure Laterality Date  . ANTERIOR AND POSTERIOR REPAIR N/A 05/11/2018   Procedure: ANTERIOR (CYSTOCELE) REPAIR;  Surgeon: Tyson Dense, MD;  Location: [facility];  Service: Gynecology;  Laterality: N/A;  . COLONOSCOPY     polyps  . CYSTOSCOPY  05/11/2018   Procedure: CYSTOSCOPY;  Surgeon: Tyson Dense, MD;  Location: [facility];  Service: Gynecology;;  . Consuela Mimes N/A 05/11/2018   Procedure: Consuela Mimes;  Surgeon: Bjorn Loser, MD;  Location: [facility];  Service: Urology;  Laterality: N/A;  . laparoscopic drainage of ovarian cyst  1994  . LAPAROSCOPIC VAGINAL HYSTERECTOMY WITH SALPINGO OOPHORECTOMY Bilateral 05/11/2018   Procedure: LAPAROSCOPIC ASSISTED VAGINAL HYSTERECTOMY WITH SALPINGO OOPHORECTOMY, MCCALL'S CULDOPLASTY;  Surgeon: Tyson Dense, MD;  Location: [facility];  Service: Gynecology;  Laterality: Bilateral;  . PUBOVAGINAL SLING N/A 05/11/2018   Procedure: Gaynelle Arabian;  Surgeon: Bjorn Loser, MD;  Location: [facility];  Service: Urology;  Laterality: N/A;   Family History  Problem Relation Age of Onset  . Heart disease Mother   . Heart failure Mother   . Hypertension Father   . Diabetes Sister   . Hypertension Sister   . Diabetes Paternal Grandmother   . Breast cancer Paternal Aunt   . Pancreatic cancer Paternal Uncle    Social History   Socioeconomic History  . Marital status: Married    Spouse  name: Not on file  . Number of children: 1  . Years of education: Not on file  . Highest education level: Not on file  Occupational History  . Occupation: Retired     Comment: Management consultant  . Financial resource strain: Not on file  . Food insecurity    Worry: Not on file    Inability: Not on file  . Transportation needs    Medical: Not on file    Non-medical: Not on file  Tobacco Use  . Smoking status: Former Smoker    Packs/day: 1.00    Types: Cigarettes    Quit date: 10/28/1980    Years since quitting:  38.6  . Smokeless tobacco: Never Used  Substance and Sexual Activity  . Alcohol use: Yes    Alcohol/week: 0.0 standard drinks    Comment: occasional wine couple times a month  . Drug use: Never  . Sexual activity: Not on file  Lifestyle  . Physical activity    Days per week: Not on file    Minutes per session: Not on file  . Stress: Not on file  Relationships  . Social Herbalist on phone: Not on file    Gets together: Not on file    Attends religious service: Not on file    Active member of club or organization: Not on file    Attends meetings of clubs or organizations: Not on file    Relationship status: Not on file  Other Topics Concern  . 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 lisinopril (ZESTRIL) 20 MG tablet Take 1 tablet by mouth once daily  . Wheat Dextrin (BENEFIBER PO) Take by mouth daily.  . [DISCONTINUED] Calcium Carb-Cholecalciferol (CALCIUM 600/VITAMIN D3 PO) Take 2 tablets by mouth daily.  . [DISCONTINUED] cyclobenzaprine (FLEXERIL) 10 MG tablet Take 0.5 tablets (5 mg total) by mouth at bedtime.  . [DISCONTINUED] nitrofurantoin (MACRODANTIN) 100 MG capsule Take 100 mg by mouth daily.   No facility-administered encounter medications on file as of 06/23/2019.     Activities of Daily Living In your present state of health, do you have any difficulty performing the following activities: 06/23/2019  Hearing? Y  Comment bilateral hearing aids  Vision? N  Difficulty concentrating or making decisions? N  Walking or climbing stairs? N  Dressing or bathing? N  Doing errands, shopping? N  Preparing Food and eating ? N  Using the Toilet? N  In the past six months, have you accidently leaked urine? N  Do you have problems with loss of bowel control? N  Managing your Medications? N  Managing your Finances? N  Housekeeping or managing your Housekeeping?  N  Some recent data might be hidden    Patient Care Team: Vivi Barrack, MD as PCP - General (Family Medicine) Evans Lance, MD as Consulting Physician (Cardiology) Richmond Campbell, MD as Consulting Physician (Gastroenterology) Calvert Cantor, MD as Consulting Physician (Ophthalmology) Royston Sinner, Colin Benton, MD as Consulting Physician (Obstetrics and Gynecology) [facility] [place] as Consulting Physician (Orthopedic Surgery) Posey Pronto, DDS as Consulting Physician (Dentistry) Bjorn Loser, MD as Consulting Physician (Urology) [name], DO as Consulting Physician ([place]) Virgina Evener, [place] as Consulting Physician (Optometry)    Assessment:   This is a routine wellness examination for Joycelyn.  Exercise Activities and Dietary recommendations Current Exercise  Habits: Home exercise routine, Type of exercise: walking, Time (Minutes): 40, Frequency (Times/Week): 3, Weekly Exercise (Minutes/Week): 120, Intensity: Mild  Goals   None     Fall Risk Fall Risk  06/23/2019 11/21/2017 08/07/2017 10/07/2016 04/02/2016  Falls in the past year? 0 No No No No  Injury with Fall? 0 - - - -  Follow up Falls evaluation completed;Education provided;Falls prevention discussed - - - -   Is the patient's home free of loose throw rugs in walkways, pet beds, electrical cords, etc?   yes      Grab bars in the bathroom? yes      Handrails on the stairs?   yes      Adequate lighting?   yes  Timed Get Up and Go performed: completed and within normal timeframe; no gait abnormalities noted   Depression Screen PHQ 2/9 Scores 06/23/2019 07/19/2018 06/21/2018 11/21/2017  PHQ - 2 Score 0 0 0 0  PHQ- 9 Score 2 - - -     Cognitive Function- no cognitive concerns at this time Cognitive Testing  Alert? Yes         Normal Appearance? Yes  Oriented to person? Yes           Place? Yes  Time? Yes  Recall of three objects? Yes  Can perform simple calculations? Yes   Displays appropriate judgment? Yes  Can read the correct time from a watch face? Yes     Immunization History  Administered Date(s) Administered  . Influenza Whole 06/04/2009  . Influenza, High Dose Seasonal PF 05/12/2017, 06/18/2018, 04/22/2019  . Influenza, Seasonal, Injecte, Preservative Fre 05/20/2015  . Influenza-Unspecified 04/18/2014, 05/09/2016, 05/12/2017, 04/22/2019  . Td 10/23/2008  . Zoster 11/19/2011  . Zoster Recombinat (Shingrix) 03/30/2018, 08/25/2018    Qualifies for Shingles Vaccine? Shingrix completed   Screening Tests Health Maintenance  Topic Date Due  . Hepatitis C Screening  07-19-1951  . DEXA SCAN  03/19/2016  . PNA vac Low Risk Adult (2 of 2 - PPSV23) 05/15/2017  . TETANUS/TDAP  10/24/2018  . MAMMOGRAM  10/14/2019  . COLONOSCOPY  03/29/2020  . INFLUENZA VACCINE  Completed    Cancer Screenings: Lung: Low Dose CT Chest recommended if Age 68-80 years, 30 pack-year currently smoking OR have quit w/in 15years. Patient does not qualify. Breast:  Up to date on Mammogram? Yes   Up to date of Bone Density/Dexa? Yes; requesting records  Colorectal: colonoscopy 03/30/15 with Dr. Ferdinand Lango repeat due 2021    Plan:  I have personally reviewed and addressed the Medicare Annual Wellness questionnaire and have noted the following in the patient's chart:  A. Medical and social history B. Use of alcohol, tobacco or illicit drugs  C. Current medications and supplements D. Functional ability and status E.  Nutritional status F.  Physical activity G. Advance directives H. List of other physicians I.  Hospitalizations, surgeries, and ER visits in previous 12 months J.  [place] such as hearing and vision if needed, cognitive and depression L. Referrals, records requested, and appointments- requesting records from [facility]   In addition, I have reviewed and discussed with patient certain preventive protocols, quality metrics, and best practice recommendations.  A written personalized care plan for preventive services as well as general preventive health recommendations were provided to patient.   Signed,  Denman George, LPN  Nurse Health Advisor   Nurse Notes: no additional

## 2019-06-23 NOTE — Patient Instructions (Signed)
It was very nice to see you today!  Keep up the good work!  No changes today.  We will check blood work.  Come back to see me in 1 year for your next physical, or sooner if needed.   Take care, Dr Jerline Pain  Please try these tips to maintain a healthy lifestyle:   Eat at least 3 REAL meals and 1-2 snacks per day.  Aim for no more than 5 hours between eating.  If you eat breakfast, please do so within one hour of getting up.    Obtain twice as many fruits/vegetables as protein or carbohydrate foods for both lunch and dinner. (Half of each meal should be fruits/vegetables, one quarter protein, and one quarter starchy carbs)   Cut down on sweet beverages. This includes juice, soda, and sweet tea.    Exercise at least 150 minutes every week.    Preventive Care 68 Years and Older, Female Preventive care refers to lifestyle choices and visits with your health care provider that can promote health and wellness. This includes:  A yearly physical exam. This is also called an annual well check.  Regular dental and eye exams.  Immunizations.  Screening for certain conditions.  Healthy lifestyle choices, such as diet and exercise. What can I expect for my preventive care visit? Physical exam Your health care provider will check:  Height and weight. These may be used to calculate body mass index (BMI), which is a measurement that tells if you are at a healthy weight.  Heart rate and blood pressure.  Your skin for abnormal spots. Counseling Your health care provider may ask you questions about:  Alcohol, tobacco, and drug use.  Emotional well-being.  Home and relationship well-being.  Sexual activity.  Eating habits.  History of falls.  Memory and ability to understand (cognition).  Work and work Statistician.  Pregnancy and menstrual history. What immunizations do I need?  Influenza (flu) vaccine  This is recommended every year. Tetanus, diphtheria, and  pertussis (Tdap) vaccine  You may need a Td booster every 10 years. Varicella (chickenpox) vaccine  You may need this vaccine if you have not already been vaccinated. Zoster (shingles) vaccine  You may need this after age 68. Pneumococcal conjugate (PCV13) vaccine  One dose is recommended after age 40. Pneumococcal polysaccharide (PPSV23) vaccine  One dose is recommended after age 68. Measles, mumps, and rubella (MMR) vaccine  You may need at least one dose of MMR if you were born in 1957 or later. You may also need a second dose. Meningococcal conjugate (MenACWY) vaccine  You may need this if you have certain conditions. Hepatitis A vaccine  You may need this if you have certain conditions or if you travel or work in places where you may be exposed to hepatitis A. Hepatitis B vaccine  You may need this if you have certain conditions or if you travel or work in places where you may be exposed to hepatitis B. Haemophilus influenzae type b (Hib) vaccine  You may need this if you have certain conditions. You may receive vaccines as individual doses or as more than one vaccine together in one shot (combination vaccines). Talk with your health care provider about the risks and benefits of combination vaccines. What tests do I need? Blood tests  Lipid and cholesterol levels. These may be checked every 5 years, or more frequently depending on your overall health.  Hepatitis C test.  Hepatitis B test. Screening  Lung cancer screening. You  may have this screening every year starting at age 38 if you have a 30-pack-year history of smoking and currently smoke or have quit within the past 15 years.  Colorectal cancer screening. All adults should have this screening starting at age 37 and continuing until age 64. Your health care provider may recommend screening at age 79 if you are at increased risk. You will have tests every 1-10 years, depending on your results and the type of  screening test.  Diabetes screening. This is done by checking your blood sugar (glucose) after you have not eaten for a while (fasting). You may have this done every 1-3 years.  Mammogram. This may be done every 1-2 years. Talk with your health care provider about how often you should have regular mammograms.  BRCA-related cancer screening. This may be done if you have a family history of breast, ovarian, tubal, or peritoneal cancers. Other tests  Sexually transmitted disease (STD) testing.  Bone density scan. This is done to screen for osteoporosis. You may have this done starting at age 77. Follow these instructions at home: Eating and drinking  Eat a diet that includes fresh fruits and vegetables, whole grains, lean protein, and low-fat dairy products. Limit your intake of foods with high amounts of sugar, saturated fats, and salt.  Take vitamin and mineral supplements as recommended by your health care provider.  Do not drink alcohol if your health care provider tells you not to drink.  If you drink alcohol: ? Limit how much you have to 0-1 drink a day. ? Be aware of how much alcohol is in your drink. In the [place] one drink equals one 12 oz bottle of beer (355 mL), one 5 oz glass of wine (148 mL), or one 1 oz glass of hard liquor (44 mL). Lifestyle  Take daily care of your teeth and gums.  Stay active. Exercise for at least 30 minutes on 5 or more days each week.  Do not use any products that contain nicotine or tobacco, such as cigarettes, e-cigarettes, and chewing tobacco. If you need help quitting, ask your health care provider.  If you are sexually active, practice safe sex. Use a condom or other form of protection in order to prevent STIs (sexually transmitted infections).  Talk with your health care provider about taking a low-dose aspirin or statin. What's next?  Go to your health care provider once a year for a well check visit.  Ask your health care provider how  often you should have your eyes and teeth checked.  Stay up to date on all vaccines. This information is not intended to replace advice given to you by your health care provider. Make sure you discuss any questions you have with your health care provider. Document Released: 08/17/2015 Document Revised: 07/15/2018 Document Reviewed: 07/15/2018 Elsevier Patient Education  2020 [employer].

## 2019-06-23 NOTE — Assessment & Plan Note (Signed)
At goal per JNC 8.  Continue lisinopril 20 mg daily.  Discussed home blood pressure monitoring with goal 140/90 or lower.  Will check CBC, C met, TSH.

## 2019-06-27 ENCOUNTER — Other Ambulatory Visit: Payer: Self-pay

## 2019-06-27 ENCOUNTER — Encounter: Payer: Self-pay | Admitting: Family Medicine

## 2019-06-27 DIAGNOSIS — E785 Hyperlipidemia, unspecified: Secondary | ICD-10-CM | POA: Insufficient documentation

## 2019-06-27 DIAGNOSIS — R768 Other specified abnormal immunological findings in serum: Secondary | ICD-10-CM

## 2019-06-27 MED ORDER — ATORVASTATIN CALCIUM 40 MG PO TABS: 40.0000 mg | ORAL_TABLET | Freq: Every day | ORAL | 3 refills | Status: DC

## 2019-06-27 NOTE — Progress Notes (Signed)
Please inform patient of the following:  Cholesterol numbers are up since last check. Recommend starting lipitor 40mg  daily to lower risk of heart attack and stroke. Please send in if she wishes to start.   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 This does not mean she has an active infection. We need to get more blood work to make sure her body has cleared the infection. Please place order for hepatitis RNA.  All of her other labs are NORMAL.  Algis Greenhouse. Jerline Pain, MD 06/27/2019 1:11 PM

## 2019-06-28 ENCOUNTER — Other Ambulatory Visit: Payer: Self-pay

## 2019-06-29 ENCOUNTER — Other Ambulatory Visit (INDEPENDENT_AMBULATORY_CARE_PROVIDER_SITE_OTHER): Payer: Medicare Other

## 2019-06-29 DIAGNOSIS — R768 Other specified abnormal immunological findings in serum: Secondary | ICD-10-CM

## 2019-07-03 LAB — HEPATITIS C ANTIBODY
Hepatitis C Ab: REACTIVE — AB
SIGNAL TO CUT-OFF: 1.15 — ABNORMAL HIGH (ref <1.00)

## 2019-07-03 LAB — HEPATITIS C RNA QUANTITATIVE
HCV Quantitative Log: <1.18 Log IU/mL
HCV RNA, PCR, QN: <15 IU/mL

## 2019-07-03 LAB — HCV RNA,QUANTITATIVE REAL TIME PCR
HCV Quantitative Log: <1.18 Log IU/mL
HCV RNA, PCR, QN: <15 IU/mL

## 2019-07-04 NOTE — Progress Notes (Signed)
Please inform patient of the following:  Good news! She has no signs of hep C infection.  We do not need to do any further tests.   Algis Greenhouse. Jerline Pain, MD 07/04/2019 12:23 PM

## 2019-07-05 ENCOUNTER — Telehealth: Payer: Self-pay | Admitting: Family Medicine

## 2019-07-05 NOTE — Telephone Encounter (Signed)
Copied from [place] [id number]. Topic: General - Inquiry >> Jul 04, 2019  4:53 PM Alease Frame wrote: Reason for CRM: Patient called in for regular labs results. Please advise

## 2019-07-05 NOTE — Telephone Encounter (Signed)
Patient has been given her labs through [facility].

## 2019-07-13 ENCOUNTER — Other Ambulatory Visit: Payer: Self-pay | Admitting: Family Medicine

## 2019-07-13 ENCOUNTER — Telehealth: Payer: Self-pay | Admitting: Physical Therapy

## 2019-07-13 NOTE — Telephone Encounter (Signed)
Copied from [place] [id number]. Topic: General - Inquiry >> Jul 13, 2019  2:22 PM Alease Frame wrote: Reason for CRM: patient has been taking meds for about 3 weeks and as of yesterday patient is experiencing loose bowels and Hemorids are bleeding. She would like a call back from Dr Jerline Pain on what to do next . Please advise

## 2019-07-13 NOTE — Telephone Encounter (Signed)
Medication Refill - Medication: lisinopril (ZESTRIL) 20 MG tablet [id number]    Preferred Pharmacy (with phone number or street name):  [place], [place] - [id number]  [place] [place] [address]  Phone: [phone] Fax: [phone]     Agent: Please be advised that RX refills may take up to 3 business days. We ask that you follow-up with your pharmacy.    Patient is wanting a 90 day supply

## 2019-07-14 NOTE — Telephone Encounter (Signed)
Noted. Recommend OV if recurs.   Algis Greenhouse. Jerline Pain, MD 07/14/2019 12:00 PM

## 2019-07-14 NOTE — Telephone Encounter (Signed)
Patient stated she is not sure if Atorvastatin is causing her symptoms,she is feeling better today.Declined appt. Please advise.

## 2019-07-18 ENCOUNTER — Telehealth: Payer: Self-pay | Admitting: Family Medicine

## 2019-07-18 NOTE — Telephone Encounter (Signed)
Informed patient to monitor symptoms and quarantine.If she is feeling worse schedule a virtual appointment with Dr.Parker.Also if she accumulates SOB call ED, she voices understanding.

## 2019-07-18 NOTE — Telephone Encounter (Signed)
Pt stated on yesterday she was around her dad and she started developing a sinus headache and cough later in the day when she left. She also developed a low grade fever in the night that has gone away. She is going to have a Covid 19 test done at [facility] today. She would like to know if she needs to anything else in the mean time and depending on her results, should she get her dad tested as well. Please advise.

## 2019-07-18 NOTE — Telephone Encounter (Signed)
See below

## 2019-07-19 NOTE — Telephone Encounter (Signed)
Agree with plan.  Algis Greenhouse. Jerline Pain, MD 07/19/2019 8:05 AM

## 2019-09-02 ENCOUNTER — Ambulatory Visit: Payer: Medicare Other

## 2019-09-08 ENCOUNTER — Ambulatory Visit: Payer: Medicare PPO | Attending: Internal Medicine

## 2019-09-08 DIAGNOSIS — Z23 Encounter for immunization: Secondary | ICD-10-CM | POA: Insufficient documentation

## 2019-09-08 NOTE — Progress Notes (Signed)
   Covid-19 Vaccination Clinic  Name:  Jeniene Grondahl    MRN: [id number] DOB: October 03, 1950  09/08/2019  Ms. Kutter was observed post Covid-19 immunization for 15 minutes without incidence. She was provided with Vaccine Information Sheet and instruction to access the V-Safe system.   Ms. Melott was instructed to call 911 with any severe reactions post vaccine: [name] Difficulty breathing  . Swelling of your face and throat  . A fast heartbeat  . A bad rash all over your body  . Dizziness and weakness    Immunizations Administered    Name Date Dose VIS Date Route   Pfizer COVID-19 Vaccine 09/08/2019  9:15 AM 0.3 mL 07/15/2019 Intramuscular   Manufacturer: [place]   Lot: [id number]   [place]: [id number]

## 2019-09-20 ENCOUNTER — Ambulatory Visit (INDEPENDENT_AMBULATORY_CARE_PROVIDER_SITE_OTHER): Payer: Medicare PPO | Admitting: Family Medicine

## 2019-09-20 ENCOUNTER — Encounter: Payer: Self-pay | Admitting: Family Medicine

## 2019-09-20 ENCOUNTER — Other Ambulatory Visit: Payer: Self-pay

## 2019-09-20 VITALS — BP 140/80 | HR 74 | Temp 97.6°F | Ht 67.0 in | Wt 171.5 lb

## 2019-09-20 DIAGNOSIS — R399 Unspecified symptoms and signs involving the genitourinary system: Secondary | ICD-10-CM | POA: Diagnosis not present

## 2019-09-20 DIAGNOSIS — E785 Hyperlipidemia, unspecified: Secondary | ICD-10-CM | POA: Diagnosis not present

## 2019-09-20 DIAGNOSIS — I10 Essential (primary) hypertension: Secondary | ICD-10-CM

## 2019-09-20 LAB — POC URINALSYSI DIPSTICK (AUTOMATED)
Bilirubin, UA: NEGATIVE
Blood, UA: NEGATIVE
Glucose, UA: NEGATIVE
Ketones, UA: NEGATIVE
Nitrite, UA: NEGATIVE
Protein, UA: NEGATIVE
Spec Grav, UA: <=1.005 — AB (ref 1.010–1.025)
Urobilinogen, UA: 0.2 E.U./dL
pH, UA: 7.5 (ref 5.0–8.0)

## 2019-09-20 MED ORDER — NITROFURANTOIN MONOHYD MACRO 100 MG PO CAPS: 100.0000 mg | ORAL_CAPSULE | Freq: Two times a day (BID) | ORAL | 0 refills | Status: DC

## 2019-09-20 NOTE — Patient Instructions (Signed)
It was nice to see you!  You have a urinary tract infection. Please start the antibiotic.  We will check a urine culture to make sure you do not have a resistant bacteria. We will call you if we need to change your medications.   Please make sure you are drinking plenty of fluids over the next few days.  If your symptoms do not improve over the next 5-7 days, or if they worsen, please let us know. Please also let us know if you have worsening back pain, fevers, chills, or body aches.   Take care, Dr Jourdin Gens  

## 2019-09-20 NOTE — Progress Notes (Signed)
   Christine Livingston is a 69 y.o. female who presents today for an office visit.  Assessment/Plan:  New/Acute Problems: UTI UA and history consistent with UTI.  Will start Macrobid given history of allergies to cephalexin and Bactrim.  Check urine culture.  Encouraged good oral hydration.  Chronic Problems Addressed Today: Essential hypertension At goal.  Continue lisinopril 20 mg daily.  Dyslipidemia Patient recently started atorvastatin.  Has been tolerating well however she is concerned that it may be causing UTIs based on a list of side effects that she read.  Discussed with patient this is not a typical or expected side effect.  However, if continues to have recurrent UTIs in the future we will try off.     Subjective:  HPI:  Started having urinary frequency over the past several days.  No fevers or chills. Feels similar to previous UTIs. Worsened yesterday. Some right sided abdominal pain. No treatments tried.        Objective:  Physical Exam: BP 140/80   Pulse 74   Temp 97.6 F (36.4 C)   Ht 5\' 7"  (1.702 m)   Wt 171 lb 8 oz (77.8 kg)   LMP  (LMP Unknown)   SpO2 99%   BMI 26.86 kg/m   Gen: No acute distress, resting comfortably CV: Regular rate and rhythm with no murmurs appreciated Pulm: Normal work of breathing, clear to auscultation bilaterally with no crackles, wheezes, or rhonchi Neuro: Grossly normal, moves all extremities Psych: Normal affect and thought content      Caleb M. Jerline Pain, MD 09/20/2019 11:21 AM

## 2019-09-20 NOTE — Assessment & Plan Note (Signed)
Patient recently started atorvastatin.  Has been tolerating well however she is concerned that it may be causing UTIs based on a list of side effects that she read.  Discussed with patient this is not a typical or expected side effect.  However, if continues to have recurrent UTIs in the future we will try off.

## 2019-09-20 NOTE — Assessment & Plan Note (Signed)
At goal  Continue lisinopril 20mg daily

## 2019-09-21 LAB — URINE CULTURE
MICRO NUMBER:: 10156413
SPECIMEN QUALITY:: ADEQUATE

## 2019-09-23 ENCOUNTER — Ambulatory Visit: Payer: Medicare Other

## 2019-09-23 NOTE — Progress Notes (Signed)
Please inform patient of the following:  Urine culture is inconclusive. Would like for her to finish the antibiotics we prescribed and let us know if not improving.  Algis Greenhouse. Jerline Pain, MD 09/23/2019 10:37 AM

## 2019-10-03 ENCOUNTER — Ambulatory Visit: Payer: Medicare PPO | Attending: Internal Medicine

## 2019-10-03 DIAGNOSIS — Z23 Encounter for immunization: Secondary | ICD-10-CM | POA: Insufficient documentation

## 2019-10-03 NOTE — Progress Notes (Signed)
   Covid-19 Vaccination Clinic  Name:  Christine Livingston    MRN: [id number] DOB: 05-03-1951  10/03/2019  [name] was observed post Covid-19 immunization for 15 minutes without incidence. She was provided with Vaccine Information Sheet and instruction to access the V-Safe system.   [name] was instructed to call 911 with any severe reactions post vaccine: [name] Difficulty breathing  . Swelling of your face and throat  . A fast heartbeat  . A bad rash all over your body  . Dizziness and weakness    Immunizations Administered    Name Date Dose VIS Date Route   Pfizer COVID-19 Vaccine 10/03/2019  1:11 PM 0.3 mL 07/15/2019 Intramuscular   Manufacturer: [place]   Lot: [id number]   [place]: [id number]

## 2019-11-10 DIAGNOSIS — Z1231 Encounter for screening mammogram for malignant neoplasm of breast: Secondary | ICD-10-CM | POA: Diagnosis not present

## 2019-11-10 LAB — HM MAMMOGRAPHY

## 2019-11-17 ENCOUNTER — Encounter: Payer: Self-pay | Admitting: Family Medicine

## 2019-11-28 ENCOUNTER — Encounter: Payer: Self-pay | Admitting: Family Medicine

## 2020-06-15 DIAGNOSIS — D23112 Other benign neoplasm of skin of right lower eyelid, including canthus: Secondary | ICD-10-CM | POA: Diagnosis not present

## 2020-06-15 DIAGNOSIS — H2513 Age-related nuclear cataract, bilateral: Secondary | ICD-10-CM | POA: Diagnosis not present

## 2020-06-15 DIAGNOSIS — H43393 Other vitreous opacities, bilateral: Secondary | ICD-10-CM | POA: Diagnosis not present

## 2020-06-20 DIAGNOSIS — Z23 Encounter for immunization: Secondary | ICD-10-CM | POA: Diagnosis not present

## 2020-06-21 ENCOUNTER — Telehealth: Payer: Self-pay | Admitting: Family Medicine

## 2020-06-21 NOTE — Telephone Encounter (Signed)
Received records from [facility] forwarded 3 pages to Dr. Dimas Chyle 11/18/21fbg

## 2020-06-25 ENCOUNTER — Ambulatory Visit (INDEPENDENT_AMBULATORY_CARE_PROVIDER_SITE_OTHER): Payer: Medicare PPO | Admitting: Family Medicine

## 2020-06-25 ENCOUNTER — Other Ambulatory Visit: Payer: Self-pay

## 2020-06-25 ENCOUNTER — Encounter: Payer: Self-pay | Admitting: Family Medicine

## 2020-06-25 VITALS — BP 165/74 | HR 74 | Temp 97.8°F | Ht 67.0 in | Wt 173.6 lb

## 2020-06-25 DIAGNOSIS — E785 Hyperlipidemia, unspecified: Secondary | ICD-10-CM

## 2020-06-25 DIAGNOSIS — Z0001 Encounter for general adult medical examination with abnormal findings: Secondary | ICD-10-CM

## 2020-06-25 DIAGNOSIS — I10 Essential (primary) hypertension: Secondary | ICD-10-CM | POA: Diagnosis not present

## 2020-06-25 DIAGNOSIS — Z0000 Encounter for general adult medical examination without abnormal findings: Secondary | ICD-10-CM

## 2020-06-25 DIAGNOSIS — I471 Supraventricular tachycardia, unspecified: Secondary | ICD-10-CM | POA: Insufficient documentation

## 2020-06-25 LAB — COMPREHENSIVE METABOLIC PANEL
AG Ratio: 1.8 (calc) (ref 1.0–2.5)
ALT: 50 U/L — ABNORMAL HIGH (ref 6–29)
AST: 28 U/L (ref 10–35)
Albumin: 4.4 g/dL (ref 3.6–5.1)
Alkaline phosphatase (APISO): 92 U/L (ref 37–153)
BUN: 13 mg/dL (ref 7–25)
CO2: 29 mmol/L (ref 20–32)
Calcium: 9.4 mg/dL (ref 8.6–10.4)
Chloride: 105 mmol/L (ref 98–110)
Creat: 0.76 mg/dL (ref 0.50–0.99)
Globulin: 2.4 g/dL (calc) (ref 1.9–3.7)
Glucose, Bld: 101 mg/dL — ABNORMAL HIGH (ref 65–99)
Potassium: 4.3 mmol/L (ref 3.5–5.3)
Sodium: 142 mmol/L (ref 135–146)
Total Bilirubin: 0.8 mg/dL (ref 0.2–1.2)
Total Protein: 6.8 g/dL (ref 6.1–8.1)

## 2020-06-25 LAB — TSH: TSH: 1.39 mIU/L (ref 0.40–4.50)

## 2020-06-25 LAB — CBC
HCT: 43.8 % (ref 35.0–45.0)
Hemoglobin: 14.6 g/dL (ref 11.7–15.5)
MCH: 29.1 pg (ref 27.0–33.0)
MCHC: 33.3 g/dL (ref 32.0–36.0)
MCV: 87.3 fL (ref 80.0–100.0)
MPV: 11.1 fL (ref 7.5–12.5)
Platelets: 212 10*3/uL (ref 140–400)
RBC: 5.02 10*6/uL (ref 3.80–5.10)
RDW: 12.3 % (ref 11.0–15.0)
WBC: 3.7 10*3/uL — ABNORMAL LOW (ref 3.8–10.8)

## 2020-06-25 LAB — LIPID PANEL
Cholesterol: 139 mg/dL (ref <200)
HDL: 48 mg/dL — ABNORMAL LOW (ref >=50)
LDL Cholesterol (Calc): 73 mg/dL (calc)
Non-HDL Cholesterol (Calc): 91 mg/dL (calc) (ref <130)
Total CHOL/HDL Ratio: 2.9 (calc) (ref <5.0)
Triglycerides: 98 mg/dL (ref <150)

## 2020-06-25 NOTE — Assessment & Plan Note (Signed)
Above goal.  She has been previously well controlled.  Continue lisinopril 20 mg daily.  Discussed importance of low-sodium diet.  She will continue monitoring at home and let us know if persistently 150/90 or higher.

## 2020-06-25 NOTE — Assessment & Plan Note (Signed)
No recent recurrences.  Regular rate and rhythm today.

## 2020-06-25 NOTE — Patient Instructions (Signed)
It was very nice to see you today!  We will check blood work today.  Please continue working on diet and exercise.  Keep an eye on your blood pressure and let me know if persistently 150/90 or higher.  I will see you back in year for your next checkup.  Please come back to see me sooner if needed.  Take care, Dr Jerline Pain  Please try these tips to maintain a healthy lifestyle:   Eat at least 3 REAL meals and 1-2 snacks per day.  Aim for no more than 5 hours between eating.  If you eat breakfast, please do so within one hour of getting up.    Each meal should contain half fruits/vegetables, one quarter protein, and one quarter carbs (no bigger than a computer mouse)   Cut down on sweet beverages. This includes juice, soda, and sweet tea.     Drink at least 1 glass of water with each meal and aim for at least 8 glasses per day   Exercise at least 150 minutes every week.    Preventive Care 34 Years and Older, Female Preventive care refers to lifestyle choices and visits with your health care provider that can promote health and wellness. This includes:  A yearly physical exam. This is also called an annual well check.  Regular dental and eye exams.  Immunizations.  Screening for certain conditions.  Healthy lifestyle choices, such as diet and exercise. What can I expect for my preventive care visit? Physical exam Your health care provider will check:  Height and weight. These may be used to calculate body mass index (BMI), which is a measurement that tells if you are at a healthy weight.  Heart rate and blood pressure.  Your skin for abnormal spots. Counseling Your health care provider may ask you questions about:  Alcohol, tobacco, and drug use.  Emotional well-being.  Home and relationship well-being.  Sexual activity.  Eating habits.  History of falls.  Memory and ability to understand (cognition).  Work and work Statistician.  Pregnancy and  menstrual history. What immunizations do I need?  Influenza (flu) vaccine  This is recommended every year. Tetanus, diphtheria, and pertussis (Tdap) vaccine  You may need a Td booster every 10 years. Varicella (chickenpox) vaccine  You may need this vaccine if you have not already been vaccinated. Zoster (shingles) vaccine  You may need this after age 67. Pneumococcal conjugate (PCV13) vaccine  One dose is recommended after age 19. Pneumococcal polysaccharide (PPSV23) vaccine  One dose is recommended after age 67. Measles, mumps, and rubella (MMR) vaccine  You may need at least one dose of MMR if you were born in 1957 or later. You may also need a second dose. Meningococcal conjugate (MenACWY) vaccine  You may need this if you have certain conditions. Hepatitis A vaccine  You may need this if you have certain conditions or if you travel or work in places where you may be exposed to hepatitis A. Hepatitis B vaccine  You may need this if you have certain conditions or if you travel or work in places where you may be exposed to hepatitis B. Haemophilus influenzae type b (Hib) vaccine  You may need this if you have certain conditions. You may receive vaccines as individual doses or as more than one vaccine together in one shot (combination vaccines). Talk with your health care provider about the risks and benefits of combination vaccines. What tests do I need? Blood tests  Lipid and  cholesterol levels. These may be checked every 5 years, or more frequently depending on your overall health.  Hepatitis C test.  Hepatitis B test. Screening  Lung cancer screening. You may have this screening every year starting at age 55 if you have a 30-pack-year history of smoking and currently smoke or have quit within the past 15 years.  Colorectal cancer screening. All adults should have this screening starting at age 50 and continuing until age 75. Your health care provider may  recommend screening at age 45 if you are at increased risk. You will have tests every 1-10 years, depending on your results and the type of screening test.  Diabetes screening. This is done by checking your blood sugar (glucose) after you have not eaten for a while (fasting). You may have this done every 1-3 years.  Mammogram. This may be done every 1-2 years. Talk with your health care provider about how often you should have regular mammograms.  BRCA-related cancer screening. This may be done if you have a family history of breast, ovarian, tubal, or peritoneal cancers. Other tests  Sexually transmitted disease (STD) testing.  Bone density scan. This is done to screen for osteoporosis. You may have this done starting at age 65. Follow these instructions at home: Eating and drinking  Eat a diet that includes fresh fruits and vegetables, whole grains, lean protein, and low-fat dairy products. Limit your intake of foods with high amounts of sugar, saturated fats, and salt.  Take vitamin and mineral supplements as recommended by your health care provider.  Do not drink alcohol if your health care provider tells you not to drink.  If you drink alcohol: ? Limit how much you have to 0-1 drink a day. ? Be aware of how much alcohol is in your drink. In the [place] one drink equals one 12 oz bottle of beer (355 mL), one 5 oz glass of wine (148 mL), or one 1 oz glass of hard liquor (44 mL). Lifestyle  Take daily care of your teeth and gums.  Stay active. Exercise for at least 30 minutes on 5 or more days each week.  Do not use any products that contain nicotine or tobacco, such as cigarettes, e-cigarettes, and chewing tobacco. If you need help quitting, ask your health care provider.  If you are sexually active, practice safe sex. Use a condom or other form of protection in order to prevent STIs (sexually transmitted infections).  Talk with your health care provider about taking a low-dose  aspirin or statin. What's next?  Go to your health care provider once a year for a well check visit.  Ask your health care provider how often you should have your eyes and teeth checked.  Stay up to date on all vaccines. This information is not intended to replace advice given to you by your health care provider. Make sure you discuss any questions you have with your health care provider. Document Revised: 07/15/2018 Document Reviewed: 07/15/2018 Elsevier Patient Education  2020 [employer]  

## 2020-06-25 NOTE — Assessment & Plan Note (Signed)
Doing well with Lipitor 40 mg daily.  Will check lipids today.  Continue lifestyle modifications.

## 2020-06-25 NOTE — Progress Notes (Signed)
Chief Complaint:  Christine Livingston is a 69 y.o. female who presents today for her annual comprehensive physical exam and subsequent medicare annual wellness visit.    Assessment/Plan:  Chronic Problems Addressed Today: Essential hypertension Above goal.  She has been previously well controlled.  Continue lisinopril 20 mg daily.  Discussed importance of low-sodium diet.  She will continue monitoring at home and let us know if persistently 150/90 or higher.  Paroxysmal SVT (supraventricular tachycardia) (HCC) No recent recurrences.  Regular rate and rhythm today.  Dyslipidemia Doing well with Lipitor 40 mg daily.  Will check lipids today.  Continue lifestyle modifications.  Body mass index is 27.19 kg/m. / Overweight  BMI Metric Follow Up - 06/25/20 1007      BMI Metric Follow Up-Please document annually   BMI Metric Follow Up Education provided           Preventative Healthcare: She is due for colonoscopy.  She will follow-up with GI regarding this.  She is up-to-date on mammogram.  Up-to-date on Covid and flu vaccine.  Up-to-date on pneumonia vaccine as well.  Patient Counseling(The following topics were reviewed and/or handout was given):  -Nutrition: Stressed importance of moderation in sodium/caffeine intake, saturated fat and cholesterol, caloric balance, sufficient intake of fresh fruits, vegetables, and fiber.  -Stressed the importance of regular exercise.   -Substance Abuse: Discussed cessation/primary prevention of tobacco, alcohol, or other drug use; driving or other dangerous activities under the influence; availability of treatment for abuse.   -Injury prevention: Discussed safety belts, safety helmets, smoke detector, smoking near bedding or upholstery.   -Sexuality: Discussed sexually transmitted diseases, partner selection, use of condoms, avoidance of unintended pregnancy and contraceptive alternatives.   -Dental health: Discussed importance of regular tooth  brushing, flossing, and dental visits.  -Health maintenance and immunizations reviewed. Please refer to Health maintenance section.  During the course of the visit the patient was educated and counseled about appropriate screening and preventive services including:        Fall prevention   Nutrition Physical Activity Weight Management Cognition  Return to care in 1 year for next preventative visit.     Subjective:  HPI:  Health Risk Assessment: Patient considers her overall health to be good. He has no difficulty performing the following: . Preparing food and eating . Bathing  . Getting dressed . Using the toilet . Shopping . Managing Finances . Moving around from place to place  She has not had any falls within the past year.   Depression screen [facility] 2/9 06/25/2020  Decreased Interest 0  Down, Depressed, Hopeless 0  PHQ - 2 Score 0  Altered sleeping -  Tired, decreased energy -  Change in appetite -  Feeling bad or failure about yourself  -  Trouble concentrating -  Moving slowly or fidgety/restless -  Suicidal thoughts -  PHQ-9 Score -   Lifestyle Factors: Diet: Balanced.  Exercise: Limited.   Patient Care Team: Vivi Barrack, MD as PCP - General (Family Medicine) Evans Lance, MD as Consulting Physician (Cardiology) Richmond Campbell, MD as Consulting Physician (Gastroenterology) Calvert Cantor, MD as Consulting Physician (Ophthalmology) Royston Sinner, Colin Benton, MD as Consulting Physician (Obstetrics and Gynecology) [facility] [place] as Consulting Physician (Orthopedic Surgery) Posey Pronto, DDS as Consulting Physician (Dentistry) Bjorn Loser, MD as Consulting Physician (Urology) [name], DO as Consulting Physician ([place]) Virgina Evener, OD as Consulting Physician (Optometry)   ROS: Per HPI, otherwise a complete review of systems  was negative.   PMH:  The following were reviewed and entered/updated in  epic: Past Medical History:  Diagnosis Date  . Constipation   . Hemorrhoid    banding  . Hypertension   . SVD (spontaneous vaginal delivery)    x 1  . WPW (Wolff-Parkinson-White syndrome)    occasional fast heart beat, pt coughs and goes back to normal, no meds   Patient Active Problem List   Diagnosis Date Noted  . Paroxysmal SVT (supraventricular tachycardia) ([place]) 06/25/2020  . Dyslipidemia 06/27/2019  . Thoracic back pain 06/21/2018  . Post-menopause 06/21/2018  . S/P hysterectomy with oophorectomy 05/11/2018  . Prolapsed uterus 08/25/2017  . External hemorrhoids 01/07/2017  . Wolff-Parkinson-White (WPW) syndrome 01/07/2017  . History of colonic polyps 01/30/2015  . Essential hypertension 02/01/2007   Past Surgical History:  Procedure Laterality Date  . ANTERIOR AND POSTERIOR REPAIR N/A 05/11/2018   Procedure: ANTERIOR (CYSTOCELE) REPAIR;  Surgeon: Tyson Dense, MD;  Location: [facility];  Service: Gynecology;  Laterality: N/A;  . COLONOSCOPY     polyps  . CYSTOSCOPY  05/11/2018   Procedure: CYSTOSCOPY;  Surgeon: Tyson Dense, MD;  Location: [facility];  Service: Gynecology;;  . Consuela Mimes N/A 05/11/2018   Procedure: Consuela Mimes;  Surgeon: Bjorn Loser, MD;  Location: [facility];  Service: Urology;  Laterality: N/A;  . laparoscopic drainage of ovarian cyst  1994  . LAPAROSCOPIC VAGINAL HYSTERECTOMY WITH SALPINGO OOPHORECTOMY Bilateral 05/11/2018   Procedure: LAPAROSCOPIC ASSISTED VAGINAL HYSTERECTOMY WITH SALPINGO OOPHORECTOMY, MCCALL'S CULDOPLASTY;  Surgeon: Tyson Dense, MD;  Location: [facility];  Service: Gynecology;  Laterality: Bilateral;  . PUBOVAGINAL SLING N/A 05/11/2018   Procedure: Gaynelle Arabian;  Surgeon: Bjorn Loser, MD;  Location: [facility];  Service: Urology;  Laterality: N/A;    Family History  Problem Relation Age of Onset  . Heart disease Mother   . Heart failure Mother   . Hypertension Father   . Diabetes Sister   . Hypertension  Sister   . Diabetes Paternal Grandmother   . Breast cancer Paternal Aunt   . Pancreatic cancer Paternal Uncle     Medications- reviewed and updated Current Outpatient Medications  Medication Sig Dispense Refill  . atorvastatin (LIPITOR) 40 MG tablet Take 1 tablet (40 mg total) by mouth daily. 90 tablet 3  . lisinopril (ZESTRIL) 20 MG tablet Take 1 tablet (20 mg total) by mouth daily. 90 tablet 3  . Wheat Dextrin (BENEFIBER PO) Take by mouth daily.     No current facility-administered medications for this visit.    Allergies-reviewed and updated Allergies  Allergen Reactions  . Bactrim [Sulfamethoxazole-Trimethoprim] Rash and Other (See Comments)    Leg cramp  . Cephalexin Diarrhea    Social History   Socioeconomic History  . Marital status: Married    Spouse name: Not on file  . Number of children: 1  . Years of education: Not on file  . Highest education level: Not on file  Occupational History  . Occupation: Retired     Comment: Pharmacist, hospital   Tobacco Use  . Smoking status: Former Smoker    Packs/day: 1.00    Types: Cigarettes    Quit date: 10/28/1980    Years since quitting: 39.6  . Smokeless tobacco: Never Used  Vaping Use  . Vaping Use: Never used  Substance and Sexual Activity  . Alcohol use: Yes    Alcohol/week: 0.0 standard drinks    Comment: occasional wine couple times a month  . Drug use: Never  .  Sexual activity: Not on file  Other Topics Concern  . 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 Difficulty of Paying Living Expenses: Not on file  Food Insecurity:   . Worried About Charity fundraiser in the Last Year: Not on file  . Ran Out of Food in the Last Year: Not on file  Transportation Needs:   . Lack of Transportation (Medical): Not on file  . Lack of Transportation (Non-Medical): Not on file  Physical Activity:   . Days of Exercise per Week: Not on  file  . Minutes of Exercise per Session: Not on file  Stress:   . Feeling of Stress : Not on file  Social Connections:   . Frequency of Communication with Friends and Family: Not on file  . Frequency of Social Gatherings with Friends and Family: Not on file  . Attends Religious Services: Not on file  . Active Member of Clubs or Organizations: Not on file  . Attends Archivist Meetings: Not on file  . Marital Status: Not on file        Objective:  Physical Exam: BP (!) 165/74   Pulse 74   Temp 97.8 F (36.6 C) (Temporal)   Ht 5\' 7"  (1.702 m)   Wt 173 lb 9.6 oz (78.7 kg)   LMP  (LMP Unknown)   SpO2 99%   BMI 27.19 kg/m   Body mass index is 27.19 kg/m. Wt Readings from Last 3 Encounters:  06/25/20 173 lb 9.6 oz (78.7 kg)  09/20/19 171 lb 8 oz (77.8 kg)  06/23/19 169 lb 3.2 oz (76.7 kg)   Gen: NAD, resting comfortably HEENT: TMs normal bilaterally. OP clear. No thyromegaly noted.  CV: RRR with no murmurs appreciated Pulm: NWOB, CTAB with no crackles, wheezes, or rhonchi GI: Normal bowel sounds present. Soft, Nontender, Nondistended. MSK: no edema, cyanosis, or clubbing noted Skin: warm, dry Neuro: CN2-12 grossly intact. Strength 5/5 in upper and lower extremities. Reflexes symmetric and intact bilaterally. Normal minicog with 3/3 delayed word recall.  Psych: Normal affect and thought content     Alleta Avery M. Jerline Pain, MD 06/25/2020 10:08 AM

## 2020-06-26 ENCOUNTER — Encounter: Payer: Self-pay | Admitting: Family Medicine

## 2020-06-26 DIAGNOSIS — R739 Hyperglycemia, unspecified: Secondary | ICD-10-CM | POA: Insufficient documentation

## 2020-06-26 NOTE — Progress Notes (Signed)
Please inform patient of the following:  Blood sugar and cholesterol levels are borderline but stable.  Her liver numbers up slightly. Would like for her to come back in a week or so to recheck. Please place future order for CMET.  Everything else is NORMAL and we can recheck in a year.  Algis Greenhouse. Jerline Pain, MD 06/26/2020 10:56 AM

## 2020-06-27 ENCOUNTER — Other Ambulatory Visit: Payer: Self-pay

## 2020-06-27 DIAGNOSIS — R7989 Other specified abnormal findings of blood chemistry: Secondary | ICD-10-CM

## 2020-07-02 ENCOUNTER — Other Ambulatory Visit: Payer: Medicare PPO

## 2020-07-02 ENCOUNTER — Other Ambulatory Visit: Payer: Self-pay | Admitting: Family Medicine

## 2020-07-02 ENCOUNTER — Other Ambulatory Visit: Payer: Self-pay

## 2020-07-02 DIAGNOSIS — R7989 Other specified abnormal findings of blood chemistry: Secondary | ICD-10-CM | POA: Diagnosis not present

## 2020-07-02 LAB — COMPREHENSIVE METABOLIC PANEL
AG Ratio: 2 (calc) (ref 1.0–2.5)
ALT: 40 U/L — ABNORMAL HIGH (ref 6–29)
AST: 22 U/L (ref 10–35)
Albumin: 4.5 g/dL (ref 3.6–5.1)
Alkaline phosphatase (APISO): 87 U/L (ref 37–153)
BUN: 12 mg/dL (ref 7–25)
CO2: 29 mmol/L (ref 20–32)
Calcium: 9.5 mg/dL (ref 8.6–10.4)
Chloride: 105 mmol/L (ref 98–110)
Creat: 0.84 mg/dL (ref 0.50–0.99)
Globulin: 2.2 g/dL (calc) (ref 1.9–3.7)
Glucose, Bld: 83 mg/dL (ref 65–99)
Potassium: 4.2 mmol/L (ref 3.5–5.3)
Sodium: 142 mmol/L (ref 135–146)
Total Bilirubin: 0.8 mg/dL (ref 0.2–1.2)
Total Protein: 6.7 g/dL (ref 6.1–8.1)

## 2020-07-03 ENCOUNTER — Other Ambulatory Visit: Payer: Self-pay | Admitting: *Deleted

## 2020-07-03 MED ORDER — LISINOPRIL 20 MG PO TABS
20.0000 mg | ORAL_TABLET | Freq: Every day | ORAL | 3 refills | Status: DC
Start: 2020-07-03 — End: 2021-01-04

## 2020-07-03 NOTE — Progress Notes (Signed)
Please inform patient of the following:  Liver numbers are normalizing. Do not need to do any further testing at this point but we can recheck at her next office visit.  Christine Livingston. Jerline Pain, MD 07/03/2020 8:03 AM

## 2020-07-23 DIAGNOSIS — L821 Other seborrheic keratosis: Secondary | ICD-10-CM | POA: Diagnosis not present

## 2020-07-23 DIAGNOSIS — L820 Inflamed seborrheic keratosis: Secondary | ICD-10-CM | POA: Diagnosis not present

## 2020-07-23 DIAGNOSIS — D485 Neoplasm of uncertain behavior of skin: Secondary | ICD-10-CM | POA: Diagnosis not present

## 2020-09-08 DIAGNOSIS — R197 Diarrhea, unspecified: Secondary | ICD-10-CM | POA: Diagnosis not present

## 2020-10-04 ENCOUNTER — Other Ambulatory Visit: Payer: Self-pay | Admitting: Family Medicine

## 2020-11-12 DIAGNOSIS — Z1231 Encounter for screening mammogram for malignant neoplasm of breast: Secondary | ICD-10-CM | POA: Diagnosis not present

## 2020-11-12 LAB — HM MAMMOGRAPHY

## 2020-11-14 ENCOUNTER — Encounter: Payer: Self-pay | Admitting: Family Medicine

## 2020-11-22 ENCOUNTER — Encounter: Payer: Self-pay | Admitting: Family Medicine

## 2020-11-22 DIAGNOSIS — R928 Other abnormal and inconclusive findings on diagnostic imaging of breast: Secondary | ICD-10-CM | POA: Diagnosis not present

## 2020-11-22 DIAGNOSIS — R921 Mammographic calcification found on diagnostic imaging of breast: Secondary | ICD-10-CM | POA: Diagnosis not present

## 2020-11-22 DIAGNOSIS — N6489 Other specified disorders of breast: Secondary | ICD-10-CM | POA: Diagnosis not present

## 2020-11-22 LAB — HM MAMMOGRAPHY

## 2020-12-04 ENCOUNTER — Ambulatory Visit: Payer: Medicare PPO | Admitting: Family Medicine

## 2020-12-04 ENCOUNTER — Encounter: Payer: Self-pay | Admitting: Family Medicine

## 2020-12-04 ENCOUNTER — Other Ambulatory Visit: Payer: Self-pay

## 2020-12-04 VITALS — BP 158/90 | HR 68 | Temp 97.7°F | Ht 67.0 in | Wt 167.8 lb

## 2020-12-04 DIAGNOSIS — I10 Essential (primary) hypertension: Secondary | ICD-10-CM | POA: Diagnosis not present

## 2020-12-04 DIAGNOSIS — F439 Reaction to severe stress, unspecified: Secondary | ICD-10-CM | POA: Diagnosis not present

## 2020-12-04 DIAGNOSIS — R350 Frequency of micturition: Secondary | ICD-10-CM | POA: Diagnosis not present

## 2020-12-04 DIAGNOSIS — Z23 Encounter for immunization: Secondary | ICD-10-CM | POA: Diagnosis not present

## 2020-12-04 LAB — POCT URINALYSIS DIPSTICK
Bilirubin, UA: NEGATIVE
Blood, UA: NEGATIVE
Glucose, UA: NEGATIVE
Ketones, UA: NEGATIVE
Leukocytes, UA: NEGATIVE
Nitrite, UA: NEGATIVE
Protein, UA: NEGATIVE
Spec Grav, UA: 1.01 (ref 1.010–1.025)
Urobilinogen, UA: 0.2 E.U./dL
pH, UA: 6 (ref 5.0–8.0)

## 2020-12-04 NOTE — Patient Instructions (Addendum)
It was very nice to see you today!  We will check a urine culture to make sure that you do not have a UTI.  We will give you your tetanus vaccine today.  Please monitor your blood pressure at home and let me know if persistently 150/90 or higher.  Please send me a message in a few weeks to let me know how your blood pressures are looking.  Take care, Dr Jerline Pain  PLEASE NOTE:  If you had any lab tests please let us know if you have not heard back within a few days. You may see your results on mychart before we have a chance to review them but we will give you a call once they are reviewed by [place]. If we ordered any referrals today, please let us know if you have not heard from their office within the next week.   Please try these tips to maintain a healthy lifestyle:   Eat at least 3 REAL meals and 1-2 snacks per day.  Aim for no more than 5 hours between eating.  If you eat breakfast, please do so within one hour of getting up.    Each meal should contain half fruits/vegetables, one quarter protein, and one quarter carbs (no bigger than a computer mouse)   Cut down on sweet beverages. This includes juice, soda, and sweet tea.     Drink at least 1 glass of water with each meal and aim for at least 8 glasses per day   Exercise at least 150 minutes every week.

## 2020-12-04 NOTE — Assessment & Plan Note (Signed)
Symptoms are currently manageable.  Discussed options including pharmacotherapy and referral for behavioral health however patient deferred.

## 2020-12-04 NOTE — Assessment & Plan Note (Signed)
Above goal though home readings seem to be at goal.  We will continue lisinopril 20 mg daily for now.  She will monitor at home over the next few weeks and let me know if persistently elevated.  Would consider switching to losartan 100mg  daily if blood pressure is persistently elevated.

## 2020-12-04 NOTE — Progress Notes (Signed)
   Christine Livingston is a 70 y.o. female who presents today for an office visit.  Assessment/Plan:  New/Acute Problems: Right Flank Pain Seems to be mostly muscular.  UA without blood or signs of infection.  We will check culture to rule out UTI.  We will hold off on empiric antibiotics for now given her lack of other signs and symptoms of infection.  Chronic Problems Addressed Today: Essential hypertension Above goal though home readings seem to be at goal.  We will continue lisinopril 20 mg daily for now.  She will monitor at home over the next few weeks and let me know if persistently elevated.  Would consider switching to losartan 100mg  daily if blood pressure is persistently elevated.  Stress Symptoms are currently manageable.  Discussed options including pharmacotherapy and referral for behavioral health however patient deferred.  Preventative Healthcare Tdap give today.     Subjective:  HPI:  Patient here with concerns for UTI.  Started a week ago.  Had pain in her right flank.  Hurt worse when pushing on the area.  She noticed a little bit of increased frequency as well.  No dysuria.  No hematuria.  No fevers or chills.  No abdominal pain  She had 1 episode of diarrhea yesterday.  Has had a normal bowel movement since then.  No specific treatments tried.  She is also concerned about her blood pressure being elevated.  Normally in the 140/80 range at home but is typically elevated when she comes into doctors offices.  She has been under a lot of stress recently to due caregiver stress and other familial obligations       Objective:  Physical Exam: BP (!) 158/90   Pulse 68   Temp 97.7 F (36.5 C) (Temporal)   Ht 5\' 7"  (1.702 m)   Wt 167 lb 12.8 oz (76.1 kg)   LMP  (LMP Unknown)   SpO2 97%   BMI 26.28 kg/m   Gen: No acute distress, resting comfortably CV: Regular rate and rhythm with no murmurs appreciated Pulm: Normal work of breathing, clear to auscultation  bilaterally with no crackles, wheezes, or rhonchi  MSK: Back without deformities.  No CVA tenderness. Neuro: Grossly normal, moves all extremities Psych: Normal affect and thought content      Christine Livingston M. Jerline Pain, MD 12/04/2020 9:17 AM

## 2020-12-06 ENCOUNTER — Telehealth: Payer: Self-pay

## 2020-12-06 ENCOUNTER — Other Ambulatory Visit: Payer: Self-pay

## 2020-12-06 LAB — URINE CULTURE
MICRO NUMBER:: 11843416
SPECIMEN QUALITY:: ADEQUATE

## 2020-12-06 MED ORDER — NITROFURANTOIN MONOHYD MACRO 100 MG PO CAPS: 100.0000 mg | ORAL_CAPSULE | Freq: Two times a day (BID) | ORAL | 0 refills | Status: AC

## 2020-12-06 NOTE — Telephone Encounter (Signed)
See note

## 2020-12-06 NOTE — Telephone Encounter (Signed)
Ok to send in Christine Livingston 100 Mg twice daily x 7 days ok per Dr.Parker. Patient notified, Rx sent in

## 2020-12-06 NOTE — Progress Notes (Signed)
Please inform patient of the following:  Culture shows UTI. Please see phone note. Please send in keflex 500mg  bid x 7 days.  Algis Greenhouse. Jerline Pain, MD 12/06/2020 12:57 PM

## 2020-12-06 NOTE — Telephone Encounter (Signed)
Ok to send in keflex 500mg  bid x 7 days.  Algis Greenhouse. Jerline Pain, MD 12/06/2020 12:50 PM

## 2020-12-06 NOTE — Telephone Encounter (Signed)
Pt saw her urine culture results on MyChart and would like for CMA to call her after Dr. Jerline Pain reviews the results. Pt is going out of town tomorrow morning and would like medication to be sent in before she leaves. Please advise.

## 2020-12-07 ENCOUNTER — Other Ambulatory Visit: Payer: Self-pay | Admitting: *Deleted

## 2020-12-07 ENCOUNTER — Telehealth: Payer: Self-pay

## 2020-12-07 MED ORDER — AMOXICILLIN-POT CLAVULANATE 875-125 MG PO TABS: 1.0000 | ORAL_TABLET | Freq: Two times a day (BID) | ORAL | 0 refills | Status: AC

## 2020-12-07 NOTE — Telephone Encounter (Signed)
Please advise 

## 2020-12-07 NOTE — Telephone Encounter (Signed)
Patient states she seen Dr. Jerline Pain on 5/3 for a UTI.  States was prescribed Macrobid.  States started [employer] yesterday.  States she has experienced  a reaction to this medication.  States she has insomnia and diarrhea that she believes is caused by taking the macrobid.     Patient states has stopped this medication today.    Patient is concerned bc she has to have a colonoscopy on Tuesday 12/11/20.   Would like to know if Dr. Jerline Pain could possibly prescribe a different medication that might treat the UTI.    Patient is currently out of town.  New Script would have to be sent to [facility] in [place] on [address].

## 2020-12-07 NOTE — Telephone Encounter (Signed)
Ok to send in augmentin 875 bid x 7 days.  Algis Greenhouse. Jerline Pain, MD 12/07/2020 12:42 PM

## 2020-12-07 NOTE — Telephone Encounter (Signed)
Rx send to pharmacy  

## 2020-12-11 DIAGNOSIS — K648 Other hemorrhoids: Secondary | ICD-10-CM | POA: Diagnosis not present

## 2020-12-11 DIAGNOSIS — Z86010 Personal history of colonic polyps: Secondary | ICD-10-CM | POA: Diagnosis not present

## 2020-12-11 DIAGNOSIS — K5730 Diverticulosis of large intestine without perforation or abscess without bleeding: Secondary | ICD-10-CM | POA: Diagnosis not present

## 2020-12-11 DIAGNOSIS — Z1211 Encounter for screening for malignant neoplasm of colon: Secondary | ICD-10-CM | POA: Diagnosis not present

## 2020-12-11 LAB — HM COLONOSCOPY

## 2020-12-20 ENCOUNTER — Encounter: Payer: Self-pay | Admitting: Family Medicine

## 2021-01-03 ENCOUNTER — Encounter: Payer: Self-pay | Admitting: Family Medicine

## 2021-01-03 ENCOUNTER — Other Ambulatory Visit: Payer: Self-pay | Admitting: Family Medicine

## 2021-01-04 ENCOUNTER — Ambulatory Visit: Payer: Medicare PPO | Admitting: Family Medicine

## 2021-01-04 ENCOUNTER — Other Ambulatory Visit: Payer: Self-pay | Admitting: *Deleted

## 2021-01-04 DIAGNOSIS — N764 Abscess of vulva: Secondary | ICD-10-CM | POA: Diagnosis not present

## 2021-01-04 DIAGNOSIS — R350 Frequency of micturition: Secondary | ICD-10-CM | POA: Diagnosis not present

## 2021-01-04 MED ORDER — LISINOPRIL 40 MG PO TABS: 40.0000 mg | ORAL_TABLET | Freq: Every day | ORAL | 0 refills | Status: DC

## 2021-01-14 DIAGNOSIS — Z20822 Contact with and (suspected) exposure to covid-19: Secondary | ICD-10-CM | POA: Diagnosis not present

## 2021-01-14 DIAGNOSIS — Z03818 Encounter for observation for suspected exposure to other biological agents ruled out: Secondary | ICD-10-CM | POA: Diagnosis not present

## 2021-01-14 DIAGNOSIS — I10 Essential (primary) hypertension: Secondary | ICD-10-CM | POA: Diagnosis not present

## 2021-01-15 ENCOUNTER — Encounter: Payer: Self-pay | Admitting: Family Medicine

## 2021-01-16 DIAGNOSIS — Z20822 Contact with and (suspected) exposure to covid-19: Secondary | ICD-10-CM | POA: Diagnosis not present

## 2021-01-17 ENCOUNTER — Other Ambulatory Visit: Payer: Self-pay

## 2021-01-17 ENCOUNTER — Emergency Department (HOSPITAL_COMMUNITY)
Admission: EM | Admit: 2021-01-17 | Discharge: 2021-01-17 | Disposition: A | Discharge status: Home / Self Care | Payer: Medicare PPO | Attending: Emergency Medicine | Admitting: Emergency Medicine

## 2021-01-17 ENCOUNTER — Telehealth: Payer: Self-pay | Admitting: Internal Medicine

## 2021-01-17 ENCOUNTER — Encounter (HOSPITAL_COMMUNITY): Payer: Self-pay | Admitting: Emergency Medicine

## 2021-01-17 ENCOUNTER — Telehealth: Payer: Self-pay

## 2021-01-17 ENCOUNTER — Emergency Department (HOSPITAL_COMMUNITY): Payer: Medicare PPO

## 2021-01-17 DIAGNOSIS — Z87891 Personal history of nicotine dependence: Secondary | ICD-10-CM | POA: Diagnosis not present

## 2021-01-17 DIAGNOSIS — I160 Hypertensive urgency: Secondary | ICD-10-CM | POA: Insufficient documentation

## 2021-01-17 DIAGNOSIS — Z79899 Other long term (current) drug therapy: Secondary | ICD-10-CM | POA: Insufficient documentation

## 2021-01-17 DIAGNOSIS — R519 Headache, unspecified: Secondary | ICD-10-CM | POA: Diagnosis not present

## 2021-01-17 DIAGNOSIS — I10 Essential (primary) hypertension: Secondary | ICD-10-CM | POA: Diagnosis not present

## 2021-01-17 LAB — CBC WITH DIFFERENTIAL/PLATELET
Abs Immature Granulocytes: 0.02 10*3/uL (ref 0.00–0.07)
Basophils Absolute: 0.1 10*3/uL (ref 0.0–0.1)
Basophils Relative: 1 %
Eosinophils Absolute: 0.1 10*3/uL (ref 0.0–0.5)
Eosinophils Relative: 1 %
HCT: 44.6 % (ref 36.0–46.0)
Hemoglobin: 14.7 g/dL (ref 12.0–15.0)
Immature Granulocytes: 0 %
Lymphocytes Relative: 33 %
Lymphs Abs: 1.5 10*3/uL (ref 0.7–4.0)
MCH: 29.1 pg (ref 26.0–34.0)
MCHC: 33 g/dL (ref 30.0–36.0)
MCV: 88.1 fL (ref 80.0–100.0)
Monocytes Absolute: 0.3 10*3/uL (ref 0.1–1.0)
Monocytes Relative: 8 %
Neutro Abs: 2.5 10*3/uL (ref 1.7–7.7)
Neutrophils Relative %: 57 %
Platelets: 223 10*3/uL (ref 150–400)
RBC: 5.06 MIL/uL (ref 3.87–5.11)
RDW: 12.5 % (ref 11.5–15.5)
WBC: 4.5 10*3/uL (ref 4.0–10.5)
nRBC: 0 % (ref 0.0–0.2)

## 2021-01-17 LAB — BASIC METABOLIC PANEL
Anion gap: 8 (ref 5–15)
BUN: 9 mg/dL (ref 8–23)
CO2: 25 mmol/L (ref 22–32)
Calcium: 9.2 mg/dL (ref 8.9–10.3)
Chloride: 106 mmol/L (ref 98–111)
Creatinine, Ser: 0.7 mg/dL (ref 0.44–1.00)
GFR, Estimated: >60 mL/min (ref >60)
Glucose, Bld: 121 mg/dL — ABNORMAL HIGH (ref 70–99)
Potassium: 3.7 mmol/L (ref 3.5–5.1)
Sodium: 139 mmol/L (ref 135–145)

## 2021-01-17 MED ORDER — AMLODIPINE BESYLATE 5 MG PO TABS: 5.0000 mg | ORAL_TABLET | Freq: Every day | ORAL | 0 refills | Status: DC

## 2021-01-17 NOTE — Telephone Encounter (Signed)
Spoke with pt who reports she is currently in the ED waiting to be seen.  Pt states she had also called her PCP and spoke with a nurse who advised her to call 911 d/t elevated BP, headache and insomnia.  Pt advised may call for f/u appointment after discharge from the hospital if needed.  Pt verbalizes understanding and thanked Therapist, sports for the call.

## 2021-01-17 NOTE — Telephone Encounter (Signed)
Nurse Assessment Nurse: Raenette Rover, RN, Zella Ball Date/Time (Eastern Time): 01/17/2021 8:48:39 AM Confirm and document reason for call. If symptomatic, describe symptoms. ---Delphia Grates from the office, has a Pt who needs to be triaged. No apts for the next 3 days. Pts states her BP was 170/93 has a head ache, is unable to sleep and feels her heart is not beating properly does have a heart condition called WPW. Head ache has been constant for 3 days. No difficulty breathing or chest pain. Does the patient have any new or worsening symptoms? ---Yes Will a triage be completed? ---Yes Related visit to physician within the last 2 weeks? ---Yes Does the PT have any chronic conditions? (i.e. diabetes, asthma, this includes High risk factors for pregnancy, etc.) ---Yes List chronic conditions. ---WPW, HTN, Is this a behavioral health or substance abuse call? ---No PLEASE NOTE: All timestamps contained within this report are represented as [place] Standard Time. CONFIDENTIALTY NOTICE: This fax transmission is intended only for the addressee. It contains information that is legally privileged, confidential or otherwise protected from use or disclosure. If you are not the intended recipient, you are strictly prohibited from reviewing, disclosing, copying using or disseminating any of this information or taking any action in reliance on or regarding this information. If you have received this fax in error, please notify us immediately by telephone so that we can arrange for its return to [place]. Phone: [phone], Toll-Free: [phone], Fax: [phone] Page: 2 of 2 Call Id: [id number] Guidelines Guideline Title Affirmed Question Affirmed Notes Nurse Date/Time Eilene Ghazi Time) Blood Pressure - High [1] Weakness of the face, arm or leg on one side of the body AND [2] new-onset Lahoma Crocker 01/17/2021 8:51:31 AM Disp. Time Eilene Ghazi Time) Disposition Final User 01/17/2021 8:59:16 AM 911 Outcome  Documentation Raenette Rover, RN, Zella Ball Reason: EMS on the way. 01/17/2021 8:54:18 AM Call EMS 911 Now Yes Raenette Rover, RN, Herbert Deaner Disagree/Comply Comply Caller Understands Yes PreDisposition Did not know what to do Care Advice Given Per Guideline CALL EMS 911 NOW: CARE ADVICE given per High Blood Pressure (Adult) guideline. Comments User: Wilson Singer, RN Date/Time Eilene Ghazi Time): 01/17/2021 8:50:46 AM pcp increased her htn medicine went from 20 mg to Lisinopril 40 mg since 01/04/2021 User: Wilson Singer, RN Date/Time (Eastern Time): 01/17/2021 8:51:02 AM At night heart beating differently. User: Wilson Singer, RN Date/Time Eilene Ghazi Time): 01/17/2021 8:52:48 AM Right side of the face feels different and started this morning. Noticed when she got up this morning. Referrals GO TO FACILITY OTHER - [facility]

## 2021-01-17 NOTE — ED Provider Notes (Signed)
[facility] EMERGENCY DEPARTMENT Provider Note   CSN: [id number] Arrival date & time: 01/17/21  9518     History No chief complaint on file.   Christine Livingston is a 70 y.o. female.  Christine Livingston of hypertension that has been managed on lisinopril.  Recently, her doctor doubled the dose of her medication, and she is coming in with difficult to control blood pressure despite this medication change.  She has simultaneously been dealing with life stress.  She has been taking care of her husband who recently contracted COVID.  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  She states that for the past 3 days, she has had difficulty sleeping.  She denies any dietary changes and states that she cooks most meals at home.  When she called her primary care doctor's office this morning, she was directed to the emergency department.  The history is provided by the patient.  Hypertension This is a recurrent problem. Episode onset: 2 weeks ago. The problem has been gradually worsening. Associated symptoms include headaches (mild). Pertinent negatives include no chest pain, no abdominal pain and no shortness of breath. Nothing aggravates the symptoms. Nothing relieves the symptoms. Treatments tried: increased dose of lisinopril.      Past Medical History:  Diagnosis Date   Constipation    Hemorrhoid    banding   Hypertension    SVD (spontaneous vaginal delivery)    x 1   WPW (Wolff-Parkinson-White syndrome)    occasional fast heart beat, pt coughs and goes back to normal, no meds    Patient Active Problem List   Diagnosis Date Noted   Stress 12/04/2020   Hyperglycemia 06/26/2020   Paroxysmal SVT (supraventricular tachycardia) ([place]) 06/25/2020   Dyslipidemia 06/27/2019   Thoracic back pain 06/21/2018   Post-menopause 06/21/2018   S/P hysterectomy with oophorectomy 05/11/2018   Prolapsed uterus 08/25/2017   External hemorrhoids 01/07/2017    Wolff-Parkinson-White (WPW) syndrome 01/07/2017   History of colonic polyps 01/30/2015   Essential hypertension 02/01/2007    Past Surgical History:  Procedure Laterality Date   ANTERIOR AND POSTERIOR REPAIR N/A 05/11/2018   Procedure: ANTERIOR (CYSTOCELE) REPAIR;  Surgeon: Tyson Dense, MD;  Location: [facility];  Service: Gynecology;  Laterality: N/A;   COLONOSCOPY     polyps   CYSTOSCOPY  05/11/2018   Procedure: CYSTOSCOPY;  Surgeon: Tyson Dense, MD;  Location: [facility];  Service: Gynecology;;   CYSTOSCOPY N/A 05/11/2018   Procedure: Consuela Mimes;  Surgeon: Bjorn Loser, MD;  Location: [facility];  Service: Urology;  Laterality: N/A;   laparoscopic drainage of ovarian cyst  1994   LAPAROSCOPIC VAGINAL HYSTERECTOMY WITH SALPINGO OOPHORECTOMY Bilateral 05/11/2018   Procedure: LAPAROSCOPIC ASSISTED VAGINAL HYSTERECTOMY WITH SALPINGO OOPHORECTOMY, MCCALL'S CULDOPLASTY;  Surgeon: Tyson Dense, MD;  Location: [facility];  Service: Gynecology;  Laterality: Bilateral;   PUBOVAGINAL SLING N/A 05/11/2018   Procedure: Gaynelle Arabian;  Surgeon: Bjorn Loser, MD;  Location: [facility];  Service: Urology;  Laterality: N/A;     OB History   No obstetric history on file.     Family History  Problem Relation Age of Onset   Heart disease Mother    Heart failure Mother    Hypertension Father    Diabetes Sister    Hypertension Sister    Diabetes Paternal Grandmother    Breast cancer Paternal Aunt    Pancreatic cancer Paternal Uncle     Social History  Tobacco Use   Smoking status: Former    Packs/day: 1.00    Pack years: 0.00    Types: Cigarettes    Quit date: 10/28/1980    Years since quitting: 40.2   Smokeless tobacco: Never  Vaping Use   Vaping Use: Never used  Substance Use Topics   Alcohol use: Yes    Alcohol/week: 0.0 standard drinks    Comment: occasional wine couple times a month   Drug use: Never    Home Medications Prior to Admission medications    Medication Sig Start Date End Date Taking? Authorizing Provider  amLODipine (NORVASC) 5 MG tablet Take 1 tablet (5 mg total) by mouth daily. 01/17/21  Yes Arnaldo Natal, MD  Ascorbic Acid (VITAMIN C) 100 MG tablet Take 100 mg by mouth daily.    [provider]  atorvastatin (LIPITOR) 40 MG tablet Take 1 tablet by mouth once daily 01/03/21   Vivi Barrack, MD  cholecalciferol (VITAMIN D3) 25 MCG (1000 UNIT) tablet Take 1,000 Units by mouth daily.    [provider]  lisinopril (ZESTRIL) 40 MG tablet Take 1 tablet (40 mg total) by mouth daily. 01/04/21   Vivi Barrack, MD  Wheat Dextrin (BENEFIBER PO) Take by mouth daily.    [provider]    Allergies    Bactrim [sulfamethoxazole-trimethoprim], Cephalexin, and Nitrofurantoin  Review of Systems   Review of Systems  Constitutional:  Negative for chills and fever.  HENT:  Negative for ear pain and sore throat.   Eyes:  Negative for pain and visual disturbance.  Respiratory:  Negative for cough and shortness of breath.   Cardiovascular:  Negative for chest pain and palpitations.  Gastrointestinal:  Negative for abdominal pain and vomiting.  Genitourinary:  Negative for dysuria and hematuria.  Musculoskeletal:  Negative for arthralgias and back pain.  Skin:  Negative for color change and rash.  Neurological:  Positive for headaches (mild). Negative for seizures and syncope.  All other systems reviewed and are negative.  Physical Exam Updated Vital Signs BP (!) 185/89 (BP Location: Left Arm)   Pulse (!) 59   Temp 98.7 F (37.1 C) (Oral)   Resp 13   LMP  (LMP Unknown)   SpO2 99%   Physical Exam Vitals and nursing note reviewed.  Constitutional:      General: She is not in acute distress.    Appearance: She is well-developed.  HENT:     Head: Normocephalic and atraumatic.  Eyes:     Conjunctiva/sclera: Conjunctivae normal.  Cardiovascular:     Rate and Rhythm: Normal rate and regular rhythm.      Heart sounds: No murmur heard. Pulmonary:     Effort: Pulmonary effort is normal. No respiratory distress.     Breath sounds: Normal breath sounds.  Musculoskeletal:     Cervical back: Neck supple.  Skin:    General: Skin is warm and dry.  Neurological:     General: No focal deficit present.     Mental Status: She is alert and oriented to person, place, and time.  Psychiatric:        Mood and Affect: Mood normal.        Behavior: Behavior normal.    ED Results / Procedures / Treatments   Labs (all labs ordered are listed, but only abnormal results are displayed) Labs Reviewed  BASIC METABOLIC PANEL - Abnormal; Notable for the following components:      Result Value   Glucose, Bld  121 (*)    All other components within normal limits  CBC WITH DIFFERENTIAL/PLATELET    EKG EKG Interpretation  Date/Time:  Thursday January 17 2021 09:58:34 EDT Ventricular Rate:  73 PR Interval:  136 QRS Duration: 124 QT Interval:  408 QTC Calculation: 449 R Axis:   94 Text Interpretation: Normal sinus rhythm Right bundle branch block Abnormal ECG similar to prior EKG Confirmed by Lorre Munroe ([id number]) on 01/17/2021 2:30:51 PM  Radiology CT Head Wo Contrast  Result Date: 01/17/2021 CLINICAL DATA:  Headache, intracranial hemorrhage suspected. Headache and hypertension. EXAM: CT HEAD WITHOUT CONTRAST TECHNIQUE: Contiguous axial images were obtained from the base of the skull through the vertex without intravenous contrast. COMPARISON:  No pertinent prior exams available for comparison. FINDINGS: Brain: Cerebral volume is within normal limits for age. Bilateral basal ganglia mineralization. There is no acute intracranial hemorrhage. No demarcated cortical infarct. No extra-axial fluid collection. No evidence of intracranial mass. No midline shift. Vascular: No hyperdense vessel.  Atherosclerotic calcifications. Skull: No calvarial fracture or focal suspicious osseous lesion. Sinuses/Orbits: Visualized  orbits show no acute finding. Subcentimeter right ethmoid sinus osteoma. Trace mucosal thickening within the left frontal ethmoidal recess. IMPRESSION: Unremarkable CT appearance of the brain for age. No evidence of acute intracranial abnormality. Electronically Signed   By: Kellie Simmering DO   On: 01/17/2021 10:53    Procedures Procedures   Medications Ordered in ED Medications - No data to display  ED Course  I have reviewed the triage vital signs and the nursing notes.  Pertinent labs & imaging results that were available during my care of the patient were reviewed by me and considered in my medical decision making (see chart for details).    MDM Rules/Calculators/A&P                          Wallace presents with a mild headache and elevated blood pressure.  ED work-up was within normal limits.  No evidence of chest pain or shortness of breath.  Troponins were not ordered.  I will add Norvasc to her medication regimen.  We talked about stress as a cause of hypertension as well, and she was encouraged to take breaks from her responsibilities when she is able.  She will follow-up with her primary care doctor to assess her response to this medication. Final Clinical Impression(s) / ED Diagnoses Final diagnoses:  Hypertensive urgency    Rx / DC Orders ED Discharge Orders          Ordered    amLODipine (NORVASC) 5 MG tablet  Daily        01/17/21 1426             Arnaldo Natal, MD 01/17/21 1431

## 2021-01-17 NOTE — Telephone Encounter (Signed)
Patient seen in ED. 

## 2021-01-17 NOTE — ED Provider Notes (Signed)
Emergency Medicine Provider Triage Evaluation Note  Christine Livingston , a 70 y.o. female  was evaluated in triage.  Pt complains of headache, htn, insomina. Recently had meds changes on 6/3. Denies chest pain/dizziness  Review of Systems  Positive: Headache, htn, insomnia Negative: Chest pain  Physical Exam  BP (!) 178/89 (BP Location: Right Arm)   Pulse 77   Temp 98.5 F (36.9 C) (Oral)   Resp 16   LMP  (LMP Unknown)   SpO2 97%  Gen:   Awake, no distress   Resp:  Normal effort  MSK:   Moves extremities without difficulty  Other:  Mental Status:  Alert, thought content appropriate, able to give a coherent history. Speech fluent without evidence of aphasia. Able to follow 2 step commands without difficulty.  Cranial Nerves: II-XII intact Motor: Normal tone. 5/5 strength of BUE and BLE major muscle groups including strong and equal grip strength and dorsiflexion/plantar flexion Sensory: light touch normal in all extremities.   Medical Decision Making  Medically screening exam initiated at 10:01 AM.  Appropriate orders placed.  Christine Livingston was informed that the remainder of the evaluation will be completed by another provider, this initial triage assessment does not replace that evaluation, and the importance of remaining in the ED until their evaluation is complete.     Bishop Dublin 01/17/21 1002    Arnaldo Natal, MD 01/17/21 1141

## 2021-01-17 NOTE — ED Triage Notes (Signed)
Pt arrives via EMS for HTN, insomnia, headaches. Pt's MD recently changed her BP meds on 6/3. Since then her BP has been increasing. Denies CP, Dizziness. BP180/104

## 2021-01-17 NOTE — Telephone Encounter (Signed)
Patient c/o Palpitations:  High priority if patient c/o lightheadedness, shortness of breath, or chest pain  How long have you had palpitations/irregular HR/ Afib? Are you having the symptoms now? No  Are you currently experiencing lightheadedness, SOB or CP? No but a headache  Do you have a history of afib (atrial fibrillation) or irregular heart rhythm? Yes  Have you checked your BP or HR? (document readings if available): No  Are you experiencing any other symptoms? No  Pt is wanting to be seen today because she cannot sleep at night

## 2021-01-22 ENCOUNTER — Other Ambulatory Visit: Payer: Self-pay

## 2021-01-22 ENCOUNTER — Ambulatory Visit: Payer: Medicare PPO | Admitting: Family

## 2021-01-22 ENCOUNTER — Encounter: Payer: Self-pay | Admitting: Family

## 2021-01-22 VITALS — BP 163/92 | HR 85 | Temp 98.2°F | Ht 67.0 in | Wt 164.2 lb

## 2021-01-22 DIAGNOSIS — G4709 Other insomnia: Secondary | ICD-10-CM

## 2021-01-22 DIAGNOSIS — I10 Essential (primary) hypertension: Secondary | ICD-10-CM | POA: Diagnosis not present

## 2021-01-22 DIAGNOSIS — F439 Reaction to severe stress, unspecified: Secondary | ICD-10-CM | POA: Diagnosis not present

## 2021-01-23 NOTE — Progress Notes (Signed)
Acute Office Visit  Subjective:    Patient ID: Christine Livingston, female    DOB: 1950/11/20, 70 y.o.   MRN: [id number]  Chief Complaint  Patient presents with   Follow-up    Elevated readings. Pt says that they have decreased slightly.    HPI Patient is in today as an ER follow-up from 01/17/2021 where she presented with elevated blood pressure. She reports starting Amlodipine 5 mg daily and continuing to take Lisinopril 40 mg day. She reports an improvement in her blood pressure. Is now seeing reading in the 140-150s/70-80s. She is still concerned that her blood pressure is requiring more med management. She does admit to high levels of stress taking care of her father and and her husband has COVID. She has help from her sisters. She does not routinely exercise.   Patient admits to not being able to sleep well. She has difficulty falling asleep and maintaining sleep. She has taken Melatonin in the past that helps.   Past Medical History:  Diagnosis Date   Constipation    Hemorrhoid    banding   Hypertension    SVD (spontaneous vaginal delivery)    x 1   WPW (Wolff-Parkinson-White syndrome)    occasional fast heart beat, pt coughs and goes back to normal, no meds    Past Surgical History:  Procedure Laterality Date   ANTERIOR AND POSTERIOR REPAIR N/A 05/11/2018   Procedure: ANTERIOR (CYSTOCELE) REPAIR;  Surgeon: Tyson Dense, MD;  Location: [facility];  Service: Gynecology;  Laterality: N/A;   COLONOSCOPY     polyps   CYSTOSCOPY  05/11/2018   Procedure: CYSTOSCOPY;  Surgeon: Tyson Dense, MD;  Location: [facility];  Service: Gynecology;;   CYSTOSCOPY N/A 05/11/2018   Procedure: Consuela Mimes;  Surgeon: Bjorn Loser, MD;  Location: [facility];  Service: Urology;  Laterality: N/A;   laparoscopic drainage of ovarian cyst  1994   LAPAROSCOPIC VAGINAL HYSTERECTOMY WITH SALPINGO OOPHORECTOMY Bilateral 05/11/2018   Procedure: LAPAROSCOPIC ASSISTED VAGINAL HYSTERECTOMY WITH  SALPINGO OOPHORECTOMY, MCCALL'S CULDOPLASTY;  Surgeon: Tyson Dense, MD;  Location: [facility];  Service: Gynecology;  Laterality: Bilateral;   PUBOVAGINAL SLING N/A 05/11/2018   Procedure: Gaynelle Arabian;  Surgeon: Bjorn Loser, MD;  Location: [facility];  Service: Urology;  Laterality: N/A;    Family History  Problem Relation Age of Onset   Heart disease Mother    Heart failure Mother    Hypertension Father    Diabetes Sister    Hypertension Sister    Diabetes Paternal Grandmother    Breast cancer Paternal Aunt    Pancreatic cancer Paternal Uncle     Social History   Socioeconomic History   Marital status: Married    Spouse name: Not on file   Number of children: 1   Years of education: Not on file   Highest education level: Not on file  Occupational History   Occupation: Retired     Comment: Pharmacist, hospital   Tobacco Use   Smoking status: Former    Packs/day: 1.00    Pack years: 0.00    Types: Cigarettes    Quit date: 10/28/1980    Years since quitting: 40.2   Smokeless tobacco: Never  Vaping Use   Vaping Use: Never used  Substance and Sexual Activity   Alcohol use: Yes    Alcohol/week: 0.0 standard drinks    Comment: occasional wine couple times a month   Drug use: Never   Sexual activity: Not on file  Other Topics Concern   Not on file  Social History Narrative   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 30 tablet 0   Ascorbic Acid (VITAMIN C) 100 MG tablet Take 100 mg by mouth daily.     atorvastatin (LIPITOR) 40 MG tablet Take 1 tablet by mouth once  daily 90 tablet 0   cholecalciferol (VITAMIN D3) 25 MCG (1000 UNIT) tablet Take 1,000 Units by mouth daily.     lisinopril (ZESTRIL) 40 MG tablet Take 1 tablet (40 mg total) by mouth daily. 90 tablet 0   Wheat Dextrin (BENEFIBER PO) Take by mouth daily.     No facility-administered medications prior to visit.    Allergies  Allergen Reactions   Bactrim [Sulfamethoxazole-Trimethoprim] Rash and Other (See Comments)    Leg cramp   Cephalexin Diarrhea   Nitrofurantoin Diarrhea    Review of Systems  Constitutional: Negative.   Eyes: Negative.   Respiratory: Negative.    Cardiovascular: Negative.   Endocrine: Negative.   Musculoskeletal: Negative.   Skin: Negative.   Neurological: Negative.   Hematological: Negative.   Psychiatric/Behavioral: Negative.    All other systems reviewed and are negative.     Objective:    Physical Exam Vitals and nursing note reviewed.  Constitutional:      Appearance: Normal appearance.  Cardiovascular:     Rate and Rhythm: Normal rate and regular rhythm.     Pulses: Normal pulses.     Heart sounds: Normal heart sounds.  Pulmonary:     Effort: Pulmonary effort is normal.     Breath sounds: Normal breath sounds.  Abdominal:     General: Abdomen is flat.     Palpations: Abdomen is soft.  Musculoskeletal:        General: Normal range of motion.     Cervical back: Normal range of motion and neck supple.  Skin:    General: Skin is warm and dry.  Neurological:     General: No focal deficit present.     Mental Status: She is alert and oriented to person, place, and time.  Psychiatric:        Mood and Affect: Mood normal.        Behavior: Behavior normal.    BP (!) 163/92   Pulse 85   Temp 98.2 F (36.8 C) (Temporal)   Ht 5\' 7"  (1.702 m)   Wt 164 lb 3.2 oz (74.5 kg)   LMP  (LMP Unknown)   SpO2 97%   BMI 25.72 kg/m  Wt Readings from Last 3 Encounters:  01/22/21 164 lb 3.2 oz (74.5 kg)  12/04/20 167 lb 12.8 oz (76.1 kg)  06/25/20  173 lb 9.6 oz (78.7 kg)    There are no preventive care reminders to display for this patient.  There are no preventive care reminders to display for this patient.   Lab Results  Component Value Date   TSH 1.39 06/25/2020   Lab Results  Component Value Date   WBC 4.5 01/17/2021   HGB 14.7 01/17/2021  HCT 44.6 01/17/2021   MCV 88.1 01/17/2021   PLT 223 01/17/2021   Lab Results  Component Value Date   NA 139 01/17/2021   K 3.7 01/17/2021   CO2 25 01/17/2021   GLUCOSE 121 (H) 01/17/2021   BUN 9 01/17/2021   CREATININE 0.70 01/17/2021   BILITOT 0.8 07/02/2020   ALKPHOS 77 06/23/2019   AST 22 07/02/2020   ALT 40 (H) 07/02/2020   PROT 6.7 07/02/2020   ALBUMIN 4.6 06/23/2019   CALCIUM 9.2 01/17/2021   ANIONGAP 8 01/17/2021   GFR 80.49 06/23/2019   Lab Results  Component Value Date   CHOL 139 06/25/2020   Lab Results  Component Value Date   HDL 48 (L) 06/25/2020   Lab Results  Component Value Date   LDLCALC 73 06/25/2020   Lab Results  Component Value Date   TRIG 98 06/25/2020   Lab Results  Component Value Date   CHOLHDL 2.9 06/25/2020   No results found for: HGBA1C     Assessment & Plan:   Problem List Items Addressed This Visit     Essential hypertension - Primary (Chronic)   Stress   Other Visit Diagnoses     Other insomnia           Encouraged more cardiovascular exercise. We will evaluate the effects of Amlodipine 5mg  once daily in 2 weeks. Encouraged Melatonin 3 mg daily to help with insomnia.     Kennyth Arnold, FNP

## 2021-02-05 ENCOUNTER — Other Ambulatory Visit: Payer: Self-pay

## 2021-02-05 ENCOUNTER — Telehealth (INDEPENDENT_AMBULATORY_CARE_PROVIDER_SITE_OTHER): Payer: Medicare PPO | Admitting: Family Medicine

## 2021-02-05 ENCOUNTER — Encounter: Payer: Self-pay | Admitting: Family Medicine

## 2021-02-05 VITALS — BP 127/77 | HR 75 | Ht 67.0 in | Wt 162.0 lb

## 2021-02-05 DIAGNOSIS — I10 Essential (primary) hypertension: Secondary | ICD-10-CM

## 2021-02-05 MED ORDER — AMLODIPINE BESYLATE 5 MG PO TABS: 5.0000 mg | ORAL_TABLET | Freq: Every day | ORAL | 1 refills | Status: DC

## 2021-02-05 NOTE — Assessment & Plan Note (Addendum)
Home readings have been at goal.  Recently had amlodipine added onto regimen.  She has tolerated this well.  Home readings mostly in the 120s and 130s.  We will continue current regimen of lisinopril 40 mg daily and amlodipine 5 mg daily.  She will continue home monitoring and let us know if persistently elevated.   Discussed lifestyle modifications.

## 2021-02-05 NOTE — Progress Notes (Signed)
   Yelina Sarratt is a 70 y.o. female who presents today for a telephone visit.  Assessment/Plan:  Chronic Problems Addressed Today: Essential hypertension Home readings have been at goal.  Recently had amlodipine added onto regimen.  She has tolerated this well.  Home readings mostly in the 120s and 130s.  We will continue current regimen of lisinopril 40 mg daily and amlodipine 5 mg daily.  She will continue home monitoring and let us know if persistently elevated.   Discussed lifestyle modifications.      Subjective:  HPI:  See A/p.         Objective/Observations   NAD  Telephone Visit   I connected with Leani Myron on 02/05/21 at  3:40 PM EDT via telephone and verified that I am speaking with the correct person using two identifiers. I discussed the limitations of evaluation and management by telemedicine and the availability of in person appointments. The patient expressed understanding and agreed to proceed.   Patient location: Home Provider location: [place] participating in the virtual visit: Myself and Patient  A total of 11 minutes were spent on medical discussion.      Algis Greenhouse. Jerline Pain, MD 02/05/2021 10:40 AM

## 2021-02-26 ENCOUNTER — Telehealth (INDEPENDENT_AMBULATORY_CARE_PROVIDER_SITE_OTHER): Payer: Medicare PPO | Admitting: Family Medicine

## 2021-02-26 ENCOUNTER — Encounter: Payer: Self-pay | Admitting: Family Medicine

## 2021-02-26 VITALS — BP 130/75 | HR 84

## 2021-02-26 DIAGNOSIS — R197 Diarrhea, unspecified: Secondary | ICD-10-CM

## 2021-02-26 NOTE — Progress Notes (Signed)
Virtual Visit via Video Note  I connected with Christine Livingston  on 02/26/21 at  3:20 PM EDT by a video enabled telemedicine application and verified that I am speaking with the correct person using two identifiers.  Location patient: home, [place] Location provider:work or home office Persons participating in the virtual visit: patient, provider  I discussed the limitations of evaluation and management by telemedicine and the availability of in person appointments. The patient expressed understanding and agreed to proceed.   HPI:  Acute telemedicine visit for NVD: -Onset: on 7/16 came down acutely with what she thought was a bug - had diarrhea, felt sweaty, had some nausea -she then felt better after a few days and went on vacation and had normal bowel movements until yesterday -she went out to eat last night and had diarrhea once after dinner -Denies: abdominal pain, vomiting, blood in the stools, fevers, inability to eat/drink/get out of bed, melena, hematochezia, fever -had coffee, toast, scone, soup and apple sauce today without diarrhea or symptoms -denies recent abx or travel -Pertinent past medical history: see below -Pertinent medication allergies: Allergies  Allergen Reactions   Bactrim [Sulfamethoxazole-Trimethoprim] Rash and Other (See Comments)    Leg cramp   Cephalexin Diarrhea   Nitrofurantoin Diarrhea  -COVID-19 vaccine status: has been vaccinated 2x and had a booster -had colonoscopy earlier this year  ROS: See pertinent positives and negatives per HPI.  Past Medical History:  Diagnosis Date   Constipation    Hemorrhoid    banding   Hypertension    SVD (spontaneous vaginal delivery)    x 1   WPW (Wolff-Parkinson-White syndrome)    occasional fast heart beat, pt coughs and goes back to normal, no meds    Past Surgical History:  Procedure Laterality Date   ANTERIOR AND POSTERIOR REPAIR N/A 05/11/2018   Procedure: ANTERIOR (CYSTOCELE) REPAIR;  Surgeon: Tyson Dense, MD;  Location: [facility];  Service: Gynecology;  Laterality: N/A;   COLONOSCOPY     polyps   CYSTOSCOPY  05/11/2018   Procedure: CYSTOSCOPY;  Surgeon: Tyson Dense, MD;  Location: [facility];  Service: Gynecology;;   CYSTOSCOPY N/A 05/11/2018   Procedure: Consuela Mimes;  Surgeon: Bjorn Loser, MD;  Location: [facility];  Service: Urology;  Laterality: N/A;   laparoscopic drainage of ovarian cyst  1994   LAPAROSCOPIC VAGINAL HYSTERECTOMY WITH SALPINGO OOPHORECTOMY Bilateral 05/11/2018   Procedure: LAPAROSCOPIC ASSISTED VAGINAL HYSTERECTOMY WITH SALPINGO OOPHORECTOMY, MCCALL'S CULDOPLASTY;  Surgeon: Tyson Dense, MD;  Location: [facility];  Service: Gynecology;  Laterality: Bilateral;   PUBOVAGINAL SLING N/A 05/11/2018   Procedure: Gaynelle Arabian;  Surgeon: Bjorn Loser, MD;  Location: [facility];  Service: Urology;  Laterality: N/A;     Current Outpatient Medications:    amLODipine (NORVASC) 5 MG tablet, Take 1 tablet (5 mg total) by mouth daily., Disp: 90 tablet, Rfl: 1   atorvastatin (LIPITOR) 40 MG tablet, Take 1 tablet by mouth once daily, Disp: 90 tablet, Rfl: 0   Biotin w/ Vitamins C & E (HAIR SKIN & NAILS GUMMIES PO), Take by mouth., Disp: , Rfl:    cholecalciferol (VITAMIN D3) 25 MCG (1000 UNIT) tablet, Take 2,000 Units by mouth daily., Disp: , Rfl:    lisinopril (ZESTRIL) 40 MG tablet, Take 1 tablet (40 mg total) by mouth daily., Disp: 90 tablet, Rfl: 0   melatonin 1 MG TABS tablet, Take 1 mg by mouth at bedtime. Takes 2-3 mg nightly, Disp: , Rfl:    vitamin C (ASCORBIC ACID)  500 MG tablet, Take 500 mg by mouth daily., Disp: , Rfl:    Wheat Dextrin (BENEFIBER PO), Take by mouth daily., Disp: , Rfl:   EXAM:  VITALS per patient if applicable:  GENERAL: alert, oriented, appears well and in no acute distress  HEENT: atraumatic, conjunttiva clear, no obvious abnormalities on inspection of external nose and ears  NECK: normal movements of the head and neck  LUNGS:  on inspection no signs of respiratory distress, breathing rate appears normal, no obvious gross SOB, gasping or wheezing  CV: no obvious cyanosis  MS: moves all visible extremities without noticeable abnormality  PSYCH/NEURO: pleasant and cooperative, no obvious depression or anxiety, speech and thought processing grossly intact  ASSESSMENT AND PLAN:  Discussed the following assessment and plan:  Diarrhea, unspecified type  -we discussed possible serious and likely etiologies, options for evaluation and workup, limitations of telemedicine visit vs in person visit, treatment, treatment risks and precautions. Pt prefers to treat via telemedicine empirically rather than in person at this moment. Suspect gastroenteritis w/ possible post infectious imbalance. She is not having symptoms today. Since doing better today, opted for gentle diet with avoidance of dairy and red meat for 1-2 weeks, possible probiotic, prn imodium. Given rise in covid cases in community also discussed this possibility/testing.  Advised to seek prompt in person care if worsening, new symptoms arise, or if is not improving with treatment. Discussed options for inperson care if PCP office not available. Did let this patient know that I only do telemedicine on Tuesdays and Thursdays for [place]. Advised to schedule follow up visit with PCP or [facility] if any further questions or concerns to avoid delays in care.   I discussed the assessment and treatment plan with the patient. The patient was provided an opportunity to ask questions and all were answered. The patient agreed with the plan and demonstrated an understanding of the instructions.     Lucretia Kern, DO

## 2021-02-26 NOTE — Patient Instructions (Signed)
-  bland diet and avoid dairy and red meat for 2 weeks  -could try a probiotic such as align or culturelle  -stay hydrated  -can use imodium if any further diarrhea  Seek in person care promptly if your symptoms worsen, new concerns arise or you are not improving with treatment.  It was nice to meet you today. I help [facility] out with telemedicine visits on Tuesdays and Thursdays and am available for visits on those days. If you have any concerns or questions following this visit please schedule a follow up visit with your Primary Care doctor or seek care at a local urgent care clinic to avoid delays in care.

## 2021-03-07 ENCOUNTER — Encounter: Payer: Self-pay | Admitting: Family Medicine

## 2021-03-07 ENCOUNTER — Other Ambulatory Visit: Payer: Self-pay

## 2021-03-07 ENCOUNTER — Ambulatory Visit: Payer: Medicare PPO | Admitting: Family Medicine

## 2021-03-07 VITALS — BP 158/80 | HR 85 | Temp 98.1°F | Wt 163.0 lb

## 2021-03-07 DIAGNOSIS — R195 Other fecal abnormalities: Secondary | ICD-10-CM | POA: Diagnosis not present

## 2021-03-07 DIAGNOSIS — R399 Unspecified symptoms and signs involving the genitourinary system: Secondary | ICD-10-CM

## 2021-03-07 DIAGNOSIS — I10 Essential (primary) hypertension: Secondary | ICD-10-CM | POA: Diagnosis not present

## 2021-03-07 DIAGNOSIS — N3000 Acute cystitis without hematuria: Secondary | ICD-10-CM | POA: Diagnosis not present

## 2021-03-07 LAB — COMPREHENSIVE METABOLIC PANEL
ALT: 49 U/L — ABNORMAL HIGH (ref 0–35)
AST: 33 U/L (ref 0–37)
Albumin: 4.2 g/dL (ref 3.5–5.2)
Alkaline Phosphatase: 84 U/L (ref 39–117)
BUN: 12 mg/dL (ref 6–23)
CO2: 28 mEq/L (ref 19–32)
Calcium: 9.1 mg/dL (ref 8.4–10.5)
Chloride: 103 mEq/L (ref 96–112)
Creatinine, Ser: 0.83 mg/dL (ref 0.40–1.20)
GFR: 71.65 mL/min (ref >60.00)
Glucose, Bld: 136 mg/dL — ABNORMAL HIGH (ref 70–99)
Potassium: 3.5 mEq/L (ref 3.5–5.1)
Sodium: 139 mEq/L (ref 135–145)
Total Bilirubin: 0.7 mg/dL (ref 0.2–1.2)
Total Protein: 6.8 g/dL (ref 6.0–8.3)

## 2021-03-07 LAB — POCT URINALYSIS DIPSTICK
Bilirubin, UA: NEGATIVE
Blood, UA: POSITIVE
Glucose, UA: NEGATIVE
Ketones, UA: NEGATIVE
Nitrite, UA: NEGATIVE
Protein, UA: POSITIVE — AB
Spec Grav, UA: 1.015 (ref 1.010–1.025)
Urobilinogen, UA: NEGATIVE E.U./dL — AB
pH, UA: 6 (ref 5.0–8.0)

## 2021-03-07 LAB — CBC WITH DIFFERENTIAL/PLATELET
Basophils Absolute: 0 10*3/uL (ref 0.0–0.1)
Basophils Relative: 0.7 % (ref 0.0–3.0)
Eosinophils Absolute: 0.1 10*3/uL (ref 0.0–0.7)
Eosinophils Relative: 1.1 % (ref 0.0–5.0)
HCT: 40.9 % (ref 36.0–46.0)
Hemoglobin: 13.7 g/dL (ref 12.0–15.0)
Lymphocytes Relative: 17.8 % (ref 12.0–46.0)
Lymphs Abs: 1.2 10*3/uL (ref 0.7–4.0)
MCHC: 33.5 g/dL (ref 30.0–36.0)
MCV: 87 fl (ref 78.0–100.0)
Monocytes Absolute: 0.4 10*3/uL (ref 0.1–1.0)
Monocytes Relative: 6.2 % (ref 3.0–12.0)
Neutro Abs: 5 10*3/uL (ref 1.4–7.7)
Neutrophils Relative %: 74.2 % (ref 43.0–77.0)
Platelets: 194 10*3/uL (ref 150.0–400.0)
RBC: 4.7 Mil/uL (ref 3.87–5.11)
RDW: 13.1 % (ref 11.5–15.5)
WBC: 6.8 10*3/uL (ref 4.0–10.5)

## 2021-03-07 MED ORDER — AMOXICILLIN 875 MG PO TABS: 875.0000 mg | ORAL_TABLET | Freq: Two times a day (BID) | ORAL | 0 refills | Status: AC

## 2021-03-07 NOTE — Patient Instructions (Signed)
Please return to see Dr. Jerline Pain for follow up on your blood pressure and GI issues. Schedule in 3-4 weeks after completing your antibiotics and probiotic for a month.   I will release your lab results to you on your MyChart account with further instructions. Please reply with any questions.    I've ordered antibiotics for your UTI.   If you have any questions or concerns, please don't hesitate to send me a message via MyChart or call the office at [phone]. Thank you for visiting with [place] today! It's our pleasure caring for you.

## 2021-03-07 NOTE — Addendum Note (Signed)
Addended by: Loura Back on: 03/07/2021 10:46 AM   Modules accepted: Orders

## 2021-03-07 NOTE — Progress Notes (Signed)
Subjective  CC:  Chief Complaint  Patient presents with   Urinary Tract Infection    7/16 - dizziness, diarrhea which led to 3 days of constipation  7/25 - cold sweats and diarrhea again Over the last several days has been experiencing urgency and burning    Same day acute visit; PCP not available. New pt to me. Chart reviewed.   HPI: Christine Livingston is a 70 y.o. female who presents to the office today to address the problems listed above in the chief complaint. 70 year old female with history of hypertension and recurrent UTIs presents due to GI symptoms and urinary tract symptoms.  I reviewed recent notes from her recent telehealth visit.  To briefly summarize, she had acute onset diaphoresis, loose diarrhea associated with nausea back in mid July.  Symptoms resolved with over-the-counter antidiarrheals.  However, since, her bowel movements have not normalized.  No longer having diarrhea.  However having softer than normal stool.  No blood or mucus in stool.  Some abdominal bloating but no discomfort, abdominal pain, fevers or chills.  She has been trying to eat a bland diet which is possibly helping.  She also started a probiotic which is helping in the last week.  She reports a remote history of possible mild IBS.  She denies history of diverticular disease.  She had a recent colonoscopy in June. However over the last few days, she now has increased urinary frequency and dysuria with abnormal appearing urine.  No blood in the urine.  No flank pain, fevers or chills.  No history of kidney infection.  Her most recent urinary tract infection was in May and was E. coli. Hypertension: Has history of hypertensive urgency.  Currently taking her medications.  No chest pain or shortness of breath.  No headaches.  Blood pressure is mildly elevated today, she monitors this at home.  She feels it is elevated due to the stress of her illness and rushing around this morning.  She is also taking care of  her 1 year old father.  Assessment  1. Acute cystitis without hematuria   2. UTI symptoms   3. Abnormal stools   4. Essential hypertension      Plan  Acute cystitis: Treated with amoxicillin 875 twice daily x5 days.  Await urine culture.  Routine guidance given. Abnormal stools: History most consistent with possible viral gastroenteritis and post gastroenteritis bowel changes in a patient with history of IBS.  No red flag symptoms noted today.  We will check blood work and stool pathogen panel.  Continue probiotic.  Imodium as needed.  She does have follow-up appointment with GI.  No imaging indicated at this time.  Reassured. Hypertension: Continue to monitor at home.  Continue current medications.  Lisinopril 40 mg daily and amlodipine 5 mg daily.  Follow up: Return in about 4 weeks (around 04/04/2021) for recheck.  07/01/2021  Orders Placed This Encounter  Procedures   Urine Culture   CBC with Differential/Platelet   Comprehensive metabolic panel   Gastrointestinal Pathogen Panel PCR   POCT Urinalysis Dipstick   Meds ordered this encounter  Medications   amoxicillin (AMOXIL) 875 MG tablet    Sig: Take 1 tablet (875 mg total) by mouth 2 (two) times daily for 5 days.    Dispense:  10 tablet    Refill:  0      I reviewed the patients updated PMH, FH, and SocHx.    Patient Active Problem List   Diagnosis Date Noted  Stress 12/04/2020   Hyperglycemia 06/26/2020   Paroxysmal SVT (supraventricular tachycardia) ([facility]) 06/25/2020   Dyslipidemia 06/27/2019   Thoracic back pain 06/21/2018   Post-menopause 06/21/2018   S/P hysterectomy with oophorectomy 05/11/2018   Prolapsed uterus 08/25/2017   External hemorrhoids 01/07/2017   Wolff-Parkinson-White (WPW) syndrome 01/07/2017   History of colonic polyps 01/30/2015   Essential hypertension 02/01/2007   Current Meds  Medication Sig   amLODipine (NORVASC) 5 MG tablet Take 1 tablet (5 mg total) by mouth daily.   amoxicillin  (AMOXIL) 875 MG tablet Take 1 tablet (875 mg total) by mouth 2 (two) times daily for 5 days.   atorvastatin (LIPITOR) 40 MG tablet Take 1 tablet by mouth once daily   Biotin w/ Vitamins C & E (HAIR SKIN & NAILS GUMMIES PO) Take by mouth.   cholecalciferol (VITAMIN D3) 25 MCG (1000 UNIT) tablet Take 2,000 Units by mouth daily.   lisinopril (ZESTRIL) 40 MG tablet Take 1 tablet (40 mg total) by mouth daily.   melatonin 1 MG TABS tablet Take 1 mg by mouth at bedtime. Takes 2-3 mg nightly   vitamin C (ASCORBIC ACID) 500 MG tablet Take 500 mg by mouth daily.   Wheat Dextrin (BENEFIBER PO) Take by mouth daily.    Allergies: Patient is allergic to bactrim [sulfamethoxazole-trimethoprim], cephalexin, and nitrofurantoin. Family History: Patient family history includes Breast cancer in her paternal aunt; Diabetes in her paternal grandmother and sister; Heart disease in her mother; Heart failure in her mother; Hypertension in her father and sister; Pancreatic cancer in her paternal uncle. Social History:  Patient  reports that she quit smoking about 40 years ago. Her smoking use included cigarettes. She smoked an average of 1 pack per day. She has never used smokeless tobacco. She reports current alcohol use. She reports that she does not use drugs.  Review of Systems: Constitutional: Negative for fever malaise or anorexia Cardiovascular: negative for chest pain Respiratory: negative for SOB or persistent cough Gastrointestinal: negative for abdominal pain  Objective  Vitals: BP (!) 158/80   Pulse 85   Temp 98.1 F (36.7 C) (Temporal)   Wt 163 lb (73.9 kg)   LMP  (LMP Unknown)   SpO2 98%   BMI 25.53 kg/m  General: no acute distress , A&Ox3, appears well HEENT: PEERL, conjunctiva normal, neck is supple Cardiovascular:  RRR without murmur or gallop.  Respiratory:  Good breath sounds bilaterally, CTAB with normal respiratory effort Gastrointestinal: soft, flat abdomen, normal active bowel  sounds, no palpable masses, no hepatosplenomegaly, no appreciated hernias, nontender, no CVA tenderness Skin:  Warm, no rashes    Commons side effects, risks, benefits, and alternatives for medications and treatment plan prescribed today were discussed, and the patient expressed understanding of the given instructions. Patient is instructed to call or message via MyChart if he/she has any questions or concerns regarding our treatment plan. No barriers to understanding were identified. We discussed Red Flag symptoms and signs in detail. Patient expressed understanding regarding what to do in case of urgent or emergency type symptoms.  Medication list was reconciled, printed and provided to the patient in AVS. Patient instructions and summary information was reviewed with the patient as documented in the AVS. This note was prepared with assistance of Dragon voice recognition software. Occasional wrong-word or sound-a-like substitutions may have occurred due to the inherent limitations of voice recognition software  This visit occurred during the SARS-CoV-2 public health emergency.  Safety protocols were in place, including screening questions prior  to the visit, additional usage of staff PPE, and extensive cleaning of exam room while observing appropriate contact time as indicated for disinfecting solutions.

## 2021-03-08 ENCOUNTER — Other Ambulatory Visit: Payer: Medicare PPO

## 2021-03-08 DIAGNOSIS — R195 Other fecal abnormalities: Secondary | ICD-10-CM

## 2021-03-09 LAB — URINE CULTURE
MICRO NUMBER:: 12201845
SPECIMEN QUALITY:: ADEQUATE

## 2021-03-11 LAB — GASTROINTESTINAL PATHOGEN PANEL PCR
C. difficile Tox A/B, PCR: NOT DETECTED
Campylobacter, PCR: NOT DETECTED
Cryptosporidium, PCR: NOT DETECTED
E coli (ETEC) LT/ST PCR: NOT DETECTED
E coli (STEC) stx1/stx2, PCR: NOT DETECTED
E coli 0157, PCR: NOT DETECTED
Giardia lamblia, PCR: NOT DETECTED
Norovirus, PCR: NOT DETECTED
Rotavirus A, PCR: NOT DETECTED
Salmonella, PCR: NOT DETECTED
Shigella, PCR: NOT DETECTED

## 2021-03-26 ENCOUNTER — Encounter: Payer: Self-pay | Admitting: Family Medicine

## 2021-03-26 ENCOUNTER — Other Ambulatory Visit: Payer: Self-pay

## 2021-03-26 ENCOUNTER — Ambulatory Visit: Payer: Medicare PPO | Admitting: Family Medicine

## 2021-03-26 VITALS — BP 113/74 | HR 92 | Temp 98.0°F | Ht 67.0 in | Wt 162.0 lb

## 2021-03-26 DIAGNOSIS — F439 Reaction to severe stress, unspecified: Secondary | ICD-10-CM

## 2021-03-26 DIAGNOSIS — I10 Essential (primary) hypertension: Secondary | ICD-10-CM

## 2021-03-26 DIAGNOSIS — R739 Hyperglycemia, unspecified: Secondary | ICD-10-CM

## 2021-03-26 DIAGNOSIS — K5790 Diverticulosis of intestine, part unspecified, without perforation or abscess without bleeding: Secondary | ICD-10-CM | POA: Insufficient documentation

## 2021-03-26 NOTE — Assessment & Plan Note (Signed)
Much better controlled today.  Continue amlodipine 5 mg daily and lisinopril 40 mg daily.

## 2021-03-26 NOTE — Addendum Note (Signed)
Addended by: Vivi Barrack on: 03/26/2021 02:01 PM   Modules accepted: Level of Service

## 2021-03-26 NOTE — Progress Notes (Addendum)
   Christine Livingston is a 70 y.o. female who presents today for an office visit.  Assessment/Plan:  Chronic Problems Addressed Today: Essential hypertension Much better controlled today.  Continue amlodipine 5 mg daily and lisinopril 40 mg daily.  Diverticulosis Could be contributing to her intermittent diarrhea.  Does not have any abdominal pain or fevers or chills consistent with diverticulitis.  We will continue with watchful waiting for now has persistent diarrhea would consider empiric antibiotics or CT scan.  Stress Had lengthy discussion with patient regarding her stress.  She has had a lot of caregiver stress due to her father's health as well as other family members.  Will place referral to behavioral health for further management.  Hyperglycemia Notably elevated on last admit however she was nonfasting.  We will check A1c when she comes back in for CPE.     Subjective:  HPI:  She is here for follow up on blood pressure and GI issues.  See A/P for status of chronic conditions.  She has had intermittent issues with diarrhea for the past month or so.  Most recently this morning.A ssociated symptoms are nausea. Denies abdominal pain, discomfort or fever.   She has been under a lot of stress recently due to caregiver stress and other family issue. B she recently had virtual visit with different provider and also has a follow-up visit.  She had labs including stool sample were normal.         Objective:  Physical Exam: BP 113/74   Pulse 92   Temp 98 F (36.7 C) (Temporal)   Ht '5\' 7"'$  (1.702 m)   Wt 162 lb (73.5 kg)   LMP  (LMP Unknown)   SpO2 96%   BMI 25.37 kg/m   Gen: No acute distress, resting comfortably CV: Regular rate and rhythm with no murmurs appreciated Pulm: Normal work of breathing, clear to auscultation bilaterally with no crackles, wheezes, or rhonchi GI: s, NT, ND Neuro: Grossly normal, moves all extremities Psych: Normal affect and thought  content       I,Savera Zaman,acting as a scribe for Dimas Chyle, MD.,have documented all relevant documentation on the behalf of Dimas Chyle, MD,as directed by  Dimas Chyle, MD while in the presence of Dimas Chyle, MD.  I, Dimas Chyle, MD, have reviewed all documentation for this visit. The documentation on 03/26/21 for the exam, diagnosis, procedures, and orders are all accurate and complete.  Time Spent: 45 minutes of total time was spent on the date of the encounter performing the following actions: chart review prior to seeing the patient including her two recent visits with other providers, obtaining history, performing a medically necessary exam, counseling on the treatment plan, placing orders, and documenting in our EHR.    Algis Greenhouse. Jerline Pain, MD 03/26/2021 1:47 PM

## 2021-03-26 NOTE — Patient Instructions (Signed)
It was very nice to see you today!  We will refer you to see behavioral health specialist.  Please make sure that you are taking a multivitamin with 800 IU vitamin d '1200mg'$  calcium.  Please let me know how your symptoms progress.  I will see back soon for your annual physical.  Come back to see me sooner if needed.  Take care, Dr Jerline Pain  PLEASE NOTE:  If you had any lab tests please let us know if you have not heard back within a few days. You may see your results on mychart before we have a chance to review them but we will give you a call once they are reviewed by [place]. If we ordered any referrals today, please let us know if you have not heard from their office within the next week.   Please try these tips to maintain a healthy lifestyle:  Eat at least 3 REAL meals and 1-2 snacks per day.  Aim for no more than 5 hours between eating.  If you eat breakfast, please do so within one hour of getting up.   Each meal should contain half fruits/vegetables, one quarter protein, and one quarter carbs (no bigger than a computer mouse)  Cut down on sweet beverages. This includes juice, soda, and sweet tea.   Drink at least 1 glass of water with each meal and aim for at least 8 glasses per day  Exercise at least 150 minutes every week.

## 2021-03-26 NOTE — Assessment & Plan Note (Signed)
Notably elevated on last admit however she was nonfasting.  We will check A1c when she comes back in for CPE.

## 2021-03-26 NOTE — Assessment & Plan Note (Signed)
Could be contributing to her intermittent diarrhea.  Does not have any abdominal pain or fevers or chills consistent with diverticulitis.  We will continue with watchful waiting for now has persistent diarrhea would consider empiric antibiotics or CT scan.

## 2021-03-26 NOTE — Assessment & Plan Note (Signed)
Had lengthy discussion with patient regarding her stress.  She has had a lot of caregiver stress due to her father's health as well as other family members.  Will place referral to behavioral health for further management.

## 2021-04-01 ENCOUNTER — Ambulatory Visit: Payer: Medicare PPO | Admitting: Family Medicine

## 2021-04-01 ENCOUNTER — Other Ambulatory Visit: Payer: Self-pay

## 2021-04-01 ENCOUNTER — Telehealth: Payer: Self-pay

## 2021-04-01 ENCOUNTER — Encounter: Payer: Self-pay | Admitting: Family Medicine

## 2021-04-01 VITALS — BP 139/79 | HR 90 | Temp 98.4°F | Ht 67.0 in | Wt 160.4 lb

## 2021-04-01 DIAGNOSIS — R109 Unspecified abdominal pain: Secondary | ICD-10-CM

## 2021-04-01 DIAGNOSIS — R350 Frequency of micturition: Secondary | ICD-10-CM

## 2021-04-01 LAB — POCT URINALYSIS DIPSTICK
Bilirubin, UA: NEGATIVE
Blood, UA: POSITIVE
Glucose, UA: NEGATIVE
Ketones, UA: NEGATIVE
Nitrite, UA: NEGATIVE
Protein, UA: POSITIVE — AB
Spec Grav, UA: 1.01 (ref 1.010–1.025)
Urobilinogen, UA: 0.2 E.U./dL
pH, UA: 6 (ref 5.0–8.0)

## 2021-04-01 MED ORDER — AMOXICILLIN-POT CLAVULANATE 875-125 MG PO TABS: 1.0000 | ORAL_TABLET | Freq: Two times a day (BID) | ORAL | 0 refills | Status: AC

## 2021-04-01 NOTE — Telephone Encounter (Signed)
Noted  

## 2021-04-01 NOTE — Telephone Encounter (Signed)
Patient called in stating that she thinks she has another UTI and was told by Dr.Parker to call in next time and he can place orders for a urine culture instead of doing an appointment. Okay to place orders?

## 2021-04-01 NOTE — Patient Instructions (Addendum)
  Please take Augmentin for 7 days for possible urinary tract infection.  We should have results back by Wednesday or Thursday hopefully  We hope you get to feeling better soon  Recommended follow up: For new or worsening symptoms or failure to improve.

## 2021-04-01 NOTE — Telephone Encounter (Signed)
Pt called back stating that she is fine seeing another provider and she would like to go ahead and come into the office. Pt is scheduled with Dr Yong Channel today.

## 2021-04-01 NOTE — Addendum Note (Signed)
Addended by: Marin Olp on: 04/01/2021 04:04 PM   Modules accepted: Orders

## 2021-04-01 NOTE — Progress Notes (Signed)
Phone [phone] In person visit   Subjective:   Christine Livingston is a 70 y.o. year old very pleasant female patient who presents for/with See problem oriented charting Chief Complaint  Patient presents with   Urinary Frequency    Accompanied by pressure.    Fatigue    Doesn't feel like she has much energy.     This visit occurred during the SARS-CoV-2 public health emergency.  Safety protocols were in place, including screening questions prior to the visit, additional usage of staff PPE, and extensive cleaning of exam room while observing appropriate contact time as indicated for disinfecting solutions.   Past Medical History-  Patient Active Problem List   Diagnosis Date Noted   Diverticulosis 03/26/2021   Stress 12/04/2020   Hyperglycemia 06/26/2020   Paroxysmal SVT (supraventricular tachycardia) ([place]) 06/25/2020   Dyslipidemia 06/27/2019   Thoracic back pain 06/21/2018   Post-menopause 06/21/2018   S/P hysterectomy with oophorectomy 05/11/2018   Prolapsed uterus 08/25/2017   External hemorrhoids 01/07/2017   Wolff-Parkinson-White (WPW) syndrome 01/07/2017   History of colonic polyps 01/30/2015   Essential hypertension 02/01/2007    Medications- reviewed and updated Current Outpatient Medications  Medication Sig Dispense Refill   amLODipine (NORVASC) 5 MG tablet Take 1 tablet (5 mg total) by mouth daily. 90 tablet 1   amoxicillin-clavulanate (AUGMENTIN) 875-125 MG tablet Take 1 tablet by mouth 2 (two) times daily for 7 days. 14 tablet 0   atorvastatin (LIPITOR) 40 MG tablet Take 1 tablet by mouth once daily 90 tablet 0   Biotin w/ Vitamins C & E (HAIR SKIN & NAILS GUMMIES PO) Take by mouth.     cholecalciferol (VITAMIN D3) 25 MCG (1000 UNIT) tablet Take 2,000 Units by mouth daily.     lisinopril (ZESTRIL) 40 MG tablet Take 1 tablet (40 mg total) by mouth daily. 90 tablet 0   melatonin 1 MG TABS tablet Take 1 mg by mouth at bedtime. Takes 2-3 mg nightly      Probiotic Product (PROBIOTIC ADVANCED) CAPS Take by mouth.     vitamin C (ASCORBIC ACID) 500 MG tablet Take 500 mg by mouth daily.     Wheat Dextrin (BENEFIBER PO) Take by mouth daily.     No current facility-administered medications for this visit.     Objective:  BP 139/79   Pulse 90   Temp 98.4 F (36.9 C) (Temporal)   Ht '5\' 7"'$  (1.702 m)   Wt 160 lb 6.4 oz (72.8 kg)   LMP  (LMP Unknown)   SpO2 98%   BMI 25.12 kg/m  Gen: NAD, resting comfortably CV: RRR no murmurs rubs or gallops Lungs: CTAB no crackles, wheeze, rhonchi Abdomen: soft/nontender other than mild tenderness in lower abdomen with deep palpation/nondistended/normal bowel sounds. No rebound or guarding.  Ext: no edema   Results for orders placed or performed in visit on 04/01/21 (from the past 24 hour(s))  POCT Urinalysis Dipstick     Status: Abnormal   Collection Time: 04/01/21  1:34 PM  Result Value Ref Range   Color, UA Yellow    Clarity, UA Clear    Glucose, UA Negative Negative   Bilirubin, UA Negative    Ketones, UA Negative    Spec Grav, UA 1.010 1.010 - 1.025   Blood, UA positive    pH, UA 6.0 5.0 - 8.0   Protein, UA Positive (A) Negative   Urobilinogen, UA 0.2 0.2 or 1.0 E.U./dL   Nitrite, UA negative    Leukocytes,  UA Moderate (2+) (A) Negative   Appearance     Odor        Assessment and Plan   #Concern for UTI S: Patients symptoms started about a week ago.  Had colonoscopy in may but before that had UTI- has had some over the years. Did finish antibiotics at that time.   Saw GYN and treated in June with  amoxicillin.   Later saw Dr. Jonni Sanger and treated for UTI on 03/07/21- pan sensitive E. Coli.   Does not occur post sex typically.   In last few months has had some episodes of feeling sweaty, nausea and then ending up with diarrhea when gets home from being out to eat- does not happen at home- careful with her foods. Has caused some avoidance of eating out. Up to date on colonoscopy as of  may as above. Avoids red meats and fried foods. This was better after prior UTI treatments then worsens  Complains of dysuria: no; polyuria: yes; nocturia: 4-5 times a night from 0-1; urgency: yes.  Symptoms are slightly better today.  ROS- no fever, chills, nausea, vomiting, flank pain. No blood in urine. Some lower abdominal pressure.  A/P: UA concerning for UTI. Will get culture. Empiric treatment with: augmentin- patient preferred empiric treatment over waiting on treatment - if has another episode with UTI may want to chat with Dr. Jerline Pain about prophylactic treatments -Interestingly enough patient's episodes of diarrhea when eating out at restaurants improved when treated for UTIs-she is not having issues with diarrhea at home and I did not at this time recommend GI follow-up but preferred for her to schedule follow-up with Dr. Jerline Pain instead as first step  Patient to follow up if new or worsening symptoms or failure to improve.   Recommended follow up: For new or worsening symptoms or failure to improve. -Discussed possibly scheduling a sooner visit to discuss patient concerns with Dr. Jerline Pain Future Appointments  Date Time Provider [place]  07/01/2021  9:20 AM Vivi Barrack, MD [facility]   Lab/Order associations:   ICD-10-CM   1. Urinary frequency  R35.0 POCT Urinalysis Dipstick    2. Abdominal pressure  R10.9 POCT Urinalysis Dipstick    3. Other fatigue  R53.83       Meds ordered this encounter  Medications   amoxicillin-clavulanate (AUGMENTIN) 875-125 MG tablet    Sig: Take 1 tablet by mouth 2 (two) times daily for 7 days.    Dispense:  14 tablet    Refill:  0   Time Spent: 24 minutes of total time (1:44 PM- 2:08 PM) was spent on the date of the encounter performing the following actions: chart review prior to seeing the patient, obtaining history, performing a medically necessary exam, counseling on the treatment plan, placing orders, and documenting in our  EHR.    Return precautions advised.  Garret Reddish, MD

## 2021-04-04 DIAGNOSIS — R197 Diarrhea, unspecified: Secondary | ICD-10-CM | POA: Diagnosis not present

## 2021-04-04 LAB — URINE CULTURE
MICRO NUMBER:: 12303666
SPECIMEN QUALITY:: ADEQUATE

## 2021-04-05 ENCOUNTER — Other Ambulatory Visit: Payer: Self-pay | Admitting: Family Medicine

## 2021-04-06 ENCOUNTER — Other Ambulatory Visit: Payer: Self-pay | Admitting: Family Medicine

## 2021-04-07 ENCOUNTER — Other Ambulatory Visit: Payer: Self-pay | Admitting: Family Medicine

## 2021-05-15 ENCOUNTER — Encounter: Payer: Self-pay | Admitting: Family Medicine

## 2021-06-20 ENCOUNTER — Telehealth: Payer: Self-pay | Admitting: Family Medicine

## 2021-06-20 NOTE — Telephone Encounter (Signed)
Copied from [place] [id number]. Topic: Medicare AWV >> Jun 20, 2021  9:33 AM Harris-Coley, Hannah Beat wrote: Reason for CRM: Left message for patient to schedule Annual Wellness Visit.  Please schedule with Nurse Health Advisor Charlott Rakes, RN at [facility].  Please call [phone] ask for [facility]

## 2021-06-24 ENCOUNTER — Ambulatory Visit (INDEPENDENT_AMBULATORY_CARE_PROVIDER_SITE_OTHER): Payer: Medicare PPO

## 2021-06-24 DIAGNOSIS — Z0000 Encounter for general adult medical examination without abnormal findings: Secondary | ICD-10-CM

## 2021-06-24 NOTE — Patient Instructions (Addendum)
Ms. Christine Livingston , Thank you for taking time to come for your Medicare Wellness Visit. I appreciate your ongoing commitment to your health goals. Please review the following plan we discussed and let me know if I can assist you in the future.   Screening recommendations/referrals: Colonoscopy: Done 03/30/15 repeat every 5 years  Mammogram: Done 11/22/20 repeat every year  Bone Density: Done 02/02/19 repeat  every 2 years  Recommended yearly ophthalmology/optometry visit for glaucoma screening and checkup Recommended yearly dental visit for hygiene and checkup  Vaccinations: Influenza vaccine: Done 05/14/21 repeat every year Pneumococcal vaccine: 05/15/16 & 01/22/17 Tdap vaccine: Done 12/04/20 repeat every 10 years  Shingles vaccine: Completed 03/30/18 & 08/25/18   Covid-19:Completed 2/4, 3/1, 05/23/20  Advanced directives: Please bring a copy of your health care power of attorney and living will to the office at your convenience.  Conditions/risks identified: Stay Healthy and active   Next appointment: Follow up in one year for your annual wellness visit    Preventive Care 65 Years and Older, Female Preventive care refers to lifestyle choices and visits with your health care provider that can promote health and wellness. What does preventive care include? A yearly physical exam. This is also called an annual well check. Dental exams once or twice a year. Routine eye exams. Ask your health care provider how often you should have your eyes checked. Personal lifestyle choices, including: Daily care of your teeth and gums. Regular physical activity. Eating a healthy diet. Avoiding tobacco and drug use. Limiting alcohol use. Practicing safe sex. Taking low-dose aspirin every day. Taking vitamin and mineral supplements as recommended by your health care provider. What happens during an annual well check? The services and screenings done by your health care provider during your annual well check will  depend on your age, overall health, lifestyle risk factors, and family history of disease. Counseling  Your health care provider may ask you questions about your: Alcohol use. Tobacco use. Drug use. Emotional well-being. Home and relationship well-being. Sexual activity. Eating habits. History of falls. Memory and ability to understand (cognition). Work and work Statistician. Reproductive health. Screening  You may have the following tests or measurements: Height, weight, and BMI. Blood pressure. Lipid and cholesterol levels. These may be checked every 5 years, or more frequently if you are over 80 years old. Skin check. Lung cancer screening. You may have this screening every year starting at age 76 if you have a 30-pack-year history of smoking and currently smoke or have quit within the past 15 years. Fecal occult blood test (FOBT) of the stool. You may have this test every year starting at age 23. Flexible sigmoidoscopy or colonoscopy. You may have a sigmoidoscopy every 5 years or a colonoscopy every 10 years starting at age 21. Hepatitis C blood test. Hepatitis B blood test. Sexually transmitted disease (STD) testing. Diabetes screening. This is done by checking your blood sugar (glucose) after you have not eaten for a while (fasting). You may have this done every 1-3 years. Bone density scan. This is done to screen for osteoporosis. You may have this done starting at age 61. Mammogram. This may be done every 1-2 years. Talk to your health care provider about how often you should have regular mammograms. Talk with your health care provider about your test results, treatment options, and if necessary, the need for more tests. Vaccines  Your health care provider may recommend certain vaccines, such as: Influenza vaccine. This is recommended every year. Tetanus, diphtheria,  and acellular pertussis (Tdap, Td) vaccine. You may need a Td booster every 10 years. Zoster vaccine. You may  need this after age 5. Pneumococcal 13-valent conjugate (PCV13) vaccine. One dose is recommended after age 35. Pneumococcal polysaccharide (PPSV23) vaccine. One dose is recommended after age 47. Talk to your health care provider about which screenings and vaccines you need and how often you need them. This information is not intended to replace advice given to you by your health care provider. Make sure you discuss any questions you have with your health care provider. Document Released: 08/17/2015 Document Revised: 04/09/2016 Document Reviewed: 05/22/2015 Elsevier Interactive Patient Education  2017 [place] Prevention in the Home Falls can cause injuries. They can happen to people of all ages. There are many things you can do to make your home safe and to help prevent falls. What can I do on the outside of my home? Regularly fix the edges of walkways and driveways and fix any cracks. Remove anything that might make you trip as you walk through a door, such as a raised step or threshold. Trim any bushes or trees on the path to your home. Use bright outdoor lighting. Clear any walking paths of anything that might make someone trip, such as rocks or tools. Regularly check to see if handrails are loose or broken. Make sure that both sides of any steps have handrails. Any raised decks and porches should have guardrails on the edges. Have any leaves, snow, or ice cleared regularly. Use sand or salt on walking paths during winter. Clean up any spills in your garage right away. This includes oil or grease spills. What can I do in the bathroom? Use night lights. Install grab bars by the toilet and in the tub and shower. Do not use towel bars as grab bars. Use non-skid mats or decals in the tub or shower. If you need to sit down in the shower, use a plastic, non-slip stool. Keep the floor dry. Clean up any water that spills on the floor as soon as it happens. Remove soap buildup in  the tub or shower regularly. Attach bath mats securely with double-sided non-slip rug tape. Do not have throw rugs and other things on the floor that can make you trip. What can I do in the bedroom? Use night lights. Make sure that you have a light by your bed that is easy to reach. Do not use any sheets or blankets that are too big for your bed. They should not hang down onto the floor. Have a firm chair that has side arms. You can use this for support while you get dressed. Do not have throw rugs and other things on the floor that can make you trip. What can I do in the kitchen? Clean up any spills right away. Avoid walking on wet floors. Keep items that you use a lot in easy-to-reach places. If you need to reach something above you, use a strong step stool that has a grab bar. Keep electrical cords out of the way. Do not use floor polish or wax that makes floors slippery. If you must use wax, use non-skid floor wax. Do not have throw rugs and other things on the floor that can make you trip. What can I do with my stairs? Do not leave any items on the stairs. Make sure that there are handrails on both sides of the stairs and use them. Fix handrails that are broken or loose. Make sure  that handrails are as long as the stairways. Check any carpeting to make sure that it is firmly attached to the stairs. Fix any carpet that is loose or worn. Avoid having throw rugs at the top or bottom of the stairs. If you do have throw rugs, attach them to the floor with carpet tape. Make sure that you have a light switch at the top of the stairs and the bottom of the stairs. If you do not have them, ask someone to add them for you. What else can I do to help prevent falls? Wear shoes that: Do not have high heels. Have rubber bottoms. Are comfortable and fit you well. Are closed at the toe. Do not wear sandals. If you use a stepladder: Make sure that it is fully opened. Do not climb a closed  stepladder. Make sure that both sides of the stepladder are locked into place. Ask someone to hold it for you, if possible. Clearly mark and make sure that you can see: Any grab bars or handrails. First and last steps. Where the edge of each step is. Use tools that help you move around (mobility aids) if they are needed. These include: Canes. Walkers. Scooters. Crutches. Turn on the lights when you go into a dark area. Replace any light bulbs as soon as they burn out. Set up your furniture so you have a clear path. Avoid moving your furniture around. If any of your floors are uneven, fix them. If there are any pets around you, be aware of where they are. Review your medicines with your doctor. Some medicines can make you feel dizzy. This can increase your chance of falling. Ask your doctor what other things that you can do to help prevent falls. This information is not intended to replace advice given to you by your health care provider. Make sure you discuss any questions you have with your health care provider. Document Released: 05/17/2009 Document Revised: 12/27/2015 Document Reviewed: 08/25/2014 Elsevier Interactive Patient Education  2017 [employer].

## 2021-06-24 NOTE — Progress Notes (Addendum)
Virtual Visit via Telephone Note  I connected with  Christine Livingston on 06/24/21 at  1:00 PM EST by telephone and verified that I am speaking with the correct person using two identifiers.  Medicare Annual Wellness visit completed telephonically due to Covid-19 pandemic.   Persons participating in this call: This Health Coach and this patient.   Location: Patient: Home Provider: Office   I discussed the limitations, risks, security and privacy concerns of performing an evaluation and management service by telephone and the availability of in person appointments. The patient expressed understanding and agreed to proceed.  Unable to perform video visit due to video visit attempted and failed and/or patient does not have video capability.   Some vital signs may be absent or patient reported.   Christine Brace, LPN   Subjective:   Christine Livingston is a 70 y.o. female who presents for Medicare Annual (Subsequent) preventive examination.  Review of Systems     Cardiac Risk Factors include: advanced age (>24men, >30 women);hypertension;dyslipidemia     Objective:    There were no vitals filed for this visit. There is no height or weight on file to calculate BMI.  Advanced Directives 06/24/2021 01/17/2021 06/23/2019 05/11/2018 05/05/2018 12/20/2015 04/15/2015  Does Patient Have a Medical Advance Directive? Yes Yes Yes No No No Yes  Type of Advance Directive Living will [place];Living will Living will;Healthcare Power of Attorney - - - -  Does patient want to make changes to medical advance directive? - - No - Patient declined - - - -  Copy of Healthcare Power of Attorney in Chart? - - No - copy requested - - - -  Would patient like information on creating a medical advance directive? - - - No - Patient declined Yes (MAU/Ambulatory/Procedural Areas - Information given) No - patient declined information -    Current Medications (verified) Outpatient Encounter  Medications as of 06/24/2021  Medication Sig   amLODipine (NORVASC) 5 MG tablet Take 1 tablet (5 mg total) by mouth daily.   atorvastatin (LIPITOR) 40 MG tablet Take 1 tablet by mouth once daily   Calcium-Phosphorus-Vitamin D (CITRACAL +D3 PO) Take by mouth.   lisinopril (ZESTRIL) 40 MG tablet Take 1 tablet by mouth once daily   melatonin 1 MG TABS tablet Take 1 mg by mouth at bedtime. Takes 2-3 mg nightly   Probiotic Product (PROBIOTIC ADVANCED) CAPS Take by mouth.   Wheat Dextrin (BENEFIBER PO) Take by mouth daily.   [DISCONTINUED] cholecalciferol (VITAMIN D3) 25 MCG (1000 UNIT) tablet Take 2,000 Units by mouth daily.   [DISCONTINUED] Biotin w/ Vitamins C & E (HAIR SKIN & NAILS GUMMIES PO) Take by mouth. (Patient not taking: Reported on 06/24/2021)   [DISCONTINUED] vitamin C (ASCORBIC ACID) 500 MG tablet Take 500 mg by mouth daily. (Patient not taking: Reported on 06/24/2021)   No facility-administered encounter medications on file as of 06/24/2021.    Allergies (verified) Bactrim [sulfamethoxazole-trimethoprim], Cephalexin, and Nitrofurantoin   History: Past Medical History:  Diagnosis Date   Constipation    Hemorrhoid    banding   Hypertension    SVD (spontaneous vaginal delivery)    x 1   WPW (Wolff-Parkinson-White syndrome)    occasional fast heart beat, pt coughs and goes back to normal, no meds   Past Surgical History:  Procedure Laterality Date   ANTERIOR AND POSTERIOR REPAIR N/A 05/11/2018   Procedure: ANTERIOR (CYSTOCELE) REPAIR;  Surgeon: Tyson Dense, MD;  Location: [facility];  Service: Gynecology;  Laterality: N/A;   COLONOSCOPY     polyps   CYSTOSCOPY  05/11/2018   Procedure: CYSTOSCOPY;  Surgeon: Tyson Dense, MD;  Location: [facility];  Service: Gynecology;;   CYSTOSCOPY N/A 05/11/2018   Procedure: Consuela Mimes;  Surgeon: Bjorn Loser, MD;  Location: [facility];  Service: Urology;  Laterality: N/A;   laparoscopic drainage of ovarian cyst  1994    LAPAROSCOPIC VAGINAL HYSTERECTOMY WITH SALPINGO OOPHORECTOMY Bilateral 05/11/2018   Procedure: LAPAROSCOPIC ASSISTED VAGINAL HYSTERECTOMY WITH SALPINGO OOPHORECTOMY, MCCALL'S CULDOPLASTY;  Surgeon: Tyson Dense, MD;  Location: [facility];  Service: Gynecology;  Laterality: Bilateral;   PUBOVAGINAL SLING N/A 05/11/2018   Procedure: Gaynelle Arabian;  Surgeon: Bjorn Loser, MD;  Location: [facility];  Service: Urology;  Laterality: N/A;   Family History  Problem Relation Age of Onset   Heart disease Mother    Heart failure Mother    Hypertension Father    Diabetes Sister    Hypertension Sister    Diabetes Paternal Grandmother    Breast cancer Paternal Aunt    Pancreatic cancer Paternal Uncle    Social History   Socioeconomic History   Marital status: Married    Spouse name: Not on file   Number of children: 1   Years of education: Not on file   Highest education level: Not on file  Occupational History   Occupation: Retired     Comment: Pharmacist, hospital   Tobacco Use   Smoking status: Former    Packs/day: 1.00    Types: Cigarettes    Quit date: 10/28/1980    Years since quitting: 40.6   Smokeless tobacco: Never  Vaping Use   Vaping Use: Never used  Substance and Sexual Activity   Alcohol use: Yes    Alcohol/week: 0.0 standard drinks    Comment: occasional wine couple times a month   Drug use: Never   Sexual activity: Not on file  Other Topics Concern   Not on file  Social History Narrative   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   Social Determinants of Health   Financial Resource Strain: Low Risk    Difficulty of Paying Living Expenses: Not hard at all  Food Insecurity: No Food Insecurity   Worried About Charity fundraiser in the Last Year: Never true   Arboriculturist in the Last Year: Never true  Transportation Needs: No Transportation Needs   Lack of Transportation (Medical): No   Lack of Transportation (Non-Medical): No  Physical Activity: Insufficiently Active   Days  of Exercise per Week: 3 days   Minutes of Exercise per Session: 30 min  Stress: Stress Concern Present   Feeling of Stress : To some extent  Social Connections: Moderately Isolated   Frequency of Communication with Friends and Family: Once a week   Frequency of Social Gatherings with Friends and Family: Once a week   Attends Religious Services: 1 to 4 times per year   Active Member of [employer] or Organizations: No   Attends Music therapist: Never   Marital Status: Married    Tobacco Counseling Counseling given: Not Answered   Clinical Intake:  Pre-visit preparation completed: Yes  Pain : No/denies pain     BMI - recorded: 25.12 Nutritional Status: BMI 25 -29 Overweight Nutritional Risks: None Diabetes: No  How often do you need to have someone help you when you read instructions, pamphlets, or other written materials from your doctor or pharmacy?: 1 - Never  Diabetic?no  Interpreter Needed?: No  Information entered by :: Charlott Rakes, LPN   Activities of Daily Living In your present state of health, do you have any difficulty performing the following activities: 06/24/2021 06/25/2020  Hearing? Y N  Comment wears hearing aids -  Vision? N N  Difficulty concentrating or making decisions? N N  Walking or climbing stairs? N N  Dressing or bathing? N N  Doing errands, shopping? N N  Preparing Food and eating ? N -  Using the Toilet? N -  In the past six months, have you accidently leaked urine? N -  Do you have problems with loss of bowel control? N -  Managing your Medications? N -  Managing your Finances? N -  Housekeeping or managing your Housekeeping? N -  Some recent data might be hidden    Patient Care Team: Vivi Barrack, MD as PCP - General (Family Medicine) Evans Lance, MD as Consulting Physician (Cardiology) Richmond Campbell, MD as Consulting Physician (Gastroenterology) Calvert Cantor, MD as Consulting Physician  (Ophthalmology) Royston Sinner Colin Benton, MD as Consulting Physician (Obstetrics and Gynecology) [facility] [place] as Consulting Physician (Orthopedic Surgery) Posey Pronto, DDS as Consulting Physician (Dentistry) Bjorn Loser, MD as Consulting Physician (Urology) [name], DO as Consulting Physician ([place]) Virgina Evener, OD as Consulting Physician (Optometry)  Indicate any recent Medical Services you may have received from other than [facility] providers in the past year (date may be approximate).     Assessment:   This is a routine wellness examination for Faustina.  Hearing/Vision screen Hearing Screening - Comments:: Pt wears hearing aids  Vision Screening - Comments:: Pt follows up with [facility]   Dietary issues and exercise activities discussed: Current Exercise Habits: Home exercise routine, Type of exercise: walking;Other - see comments (bicycle), Time (Minutes): 35, Frequency (Times/Week): 3, Weekly Exercise (Minutes/Week): 105   Goals Addressed             This Visit's Progress    Patient Stated       Stay healthy        Depression Screen PHQ 2/9 Scores 06/24/2021 02/05/2021 06/25/2020 06/23/2019 07/19/2018 06/21/2018 11/21/2017  PHQ - 2 Score 0 0 0 0 0 0 0  PHQ- 9 Score - - - 2 - - -    Fall Risk Fall Risk  06/24/2021 02/05/2021 01/22/2021 06/25/2020 06/23/2019  Falls in the past year? 1 0 0 0 0  Number falls in past yr: 1 - - 0 -  Injury with Fall? 1 - - 0 0  Comment bruised knee and elbow - - - -  Risk for fall due to : Impaired vision - No Fall Risks - -  Follow up Falls prevention discussed Falls evaluation completed - - Falls evaluation completed;Education provided;Falls prevention discussed    FALL RISK PREVENTION PERTAINING TO THE HOME:  Any stairs in or around the home? Yes If so, are there any without handrails? No  Home free of loose throw rugs in walkways, pet beds, electrical cords, etc? Yes   Adequate lighting in your home to reduce risk of falls? Yes   ASSISTIVE DEVICES UTILIZED TO PREVENT FALLS:  Life alert? No  Use of a cane, walker or w/c? No  Grab bars in the bathroom? Yes  Shower chair or bench in shower? Yes  Elevated toilet seat or a handicapped toilet? No   TIMED UP AND GO:  Was the test performed? No .   Cognitive  Function:     6CIT Screen 06/24/2021  What Year? 0 points  What month? 0 points  What time? 0 points  Count back from 20 0 points  Months in reverse 0 points  Repeat phrase 2 points  Total Score 2    Immunizations Immunization History  Administered Date(s) Administered   Fluad Quad(high Dose 65+) 05/14/2021   Influenza Whole 06/04/2009   Influenza, High Dose Seasonal PF 05/12/2017, 06/18/2018, 04/22/2019, 06/20/2020   Influenza, Seasonal, Injecte, Preservative Fre 05/20/2015   Influenza-Unspecified 04/18/2014, 05/09/2016, 05/12/2017, 04/22/2019   PFIZER(Purple Top)SARS-COV-2 Vaccination 09/08/2019, 10/03/2019, 05/23/2020   Pneumococcal-Unspecified 05/15/2016, 01/22/2017   Td 10/23/2008   Tdap 12/04/2020   Zoster Recombinat (Shingrix) 03/30/2018, 08/25/2018   Zoster, Live 11/19/2011    TDAP status: Up to date  Flu Vaccine status: Up to date  Pneumococcal vaccine status: Up to date  Covid-19 vaccine status: Completed vaccines  Qualifies for Shingles Vaccine? Yes   Zostavax completed Yes   Shingrix Completed?: Yes  Screening Tests Health Maintenance  Topic Date Due   Pneumonia Vaccine 38+ Years old (1 - PCV) 03/19/2016   COVID-19 Vaccine (4 - Booster for Pfizer series) 07/18/2020   MAMMOGRAM  11/22/2021   COLONOSCOPY (Pts 45-50yrs Insurance coverage will need to be confirmed)  12/11/2025   TETANUS/TDAP  12/05/2030   INFLUENZA VACCINE  Completed   DEXA SCAN  Completed   Hepatitis C Screening  Completed   Zoster Vaccines- Shingrix  Completed   HPV VACCINES  Aged Out    Health Maintenance  Health Maintenance Due   Topic Date Due   Pneumonia Vaccine 44+ Years old (1 - PCV) 03/19/2016   COVID-19 Vaccine (4 - Booster for Pfizer series) 07/18/2020    Colorectal cancer screening: Type of screening: Colonoscopy. Completed 12/11/20. Repeat every 5 years  Mammogram status: Completed 11/22/20. Repeat every year  Bone Density status: Completed 02/02/19. Results reflect: Bone density results: NORMAL. Repeat every 2 years.   Additional Screening:  Hepatitis C Screening:  Completed 06/23/19  Vision Screening: Recommended annual ophthalmology exams for early detection of glaucoma and other disorders of the eye. Is the patient up to date with their annual eye exam?  Yes  Who is the provider or what is the name of the office in which the patient attends annual eye exams? [facility] If pt is not established with a provider, would they like to be referred to a provider to establish care? No .   Dental Screening: Recommended annual dental exams for proper oral hygiene  Community Resource Referral / Chronic Care Management: CRR required this visit?  No   CCM required this visit?  No      Plan:     I have personally reviewed and noted the following in the patient's chart:   Medical and social history Use of alcohol, tobacco or illicit drugs  Current medications and supplements including opioid prescriptions.  Functional ability and status Nutritional status Physical activity Advanced directives List of other physicians Hospitalizations, surgeries, and ER visits in previous 12 months Vitals Screenings to include cognitive, depression, and falls Referrals and appointments  In addition, I have reviewed and discussed with patient certain preventive protocols, quality metrics, and best practice recommendations. A written personalized care plan for preventive services as well as general preventive health recommendations were provided to patient.     Christine Brace, LPN   05/31/2535   Nurse  Notes: None

## 2021-06-25 ENCOUNTER — Encounter: Payer: Medicare PPO | Admitting: Family Medicine

## 2021-06-25 ENCOUNTER — Telehealth: Payer: Self-pay

## 2021-06-25 NOTE — Telephone Encounter (Signed)
Patient states she is having back pain and thinks she might have a UTI but she wants to know if she could leave a urine. I did schedule appt for 1130 with Aldona Bar but wants to leave a urine today.

## 2021-06-25 NOTE — Telephone Encounter (Signed)
Patient will give urine sample at ov on tomorrow.

## 2021-06-26 ENCOUNTER — Ambulatory Visit: Payer: Medicare PPO | Admitting: Physician Assistant

## 2021-06-26 ENCOUNTER — Other Ambulatory Visit: Payer: Self-pay

## 2021-06-26 ENCOUNTER — Encounter: Payer: Self-pay | Admitting: Physician Assistant

## 2021-06-26 VITALS — BP 143/84 | HR 75 | Ht 67.0 in | Wt 159.6 lb

## 2021-06-26 DIAGNOSIS — M545 Low back pain, unspecified: Secondary | ICD-10-CM | POA: Diagnosis not present

## 2021-06-26 LAB — POCT URINALYSIS DIPSTICK
Bilirubin, UA: NEGATIVE
Blood, UA: NEGATIVE
Glucose, UA: NEGATIVE
Ketones, UA: NEGATIVE
Leukocytes, UA: NEGATIVE
Nitrite, UA: NEGATIVE
Protein, UA: NEGATIVE
Spec Grav, UA: <=1.005 — AB (ref 1.010–1.025)
Urobilinogen, UA: 0.2 E.U./dL
pH, UA: 6 (ref 5.0–8.0)

## 2021-06-26 MED ORDER — BACLOFEN 10 MG PO TABS: 10.0000 mg | ORAL_TABLET | Freq: Two times a day (BID) | ORAL | 0 refills | Status: DC | PRN

## 2021-06-26 MED ORDER — AMOXICILLIN-POT CLAVULANATE 875-125 MG PO TABS: 1.0000 | ORAL_TABLET | Freq: Two times a day (BID) | ORAL | 0 refills | Status: DC

## 2021-06-26 MED ORDER — MELOXICAM 7.5 MG PO TABS: 7.5000 mg | ORAL_TABLET | Freq: Every day | ORAL | 0 refills | Status: DC

## 2021-06-26 NOTE — Patient Instructions (Addendum)
It was great to see you!  Start daily mobic 7.5 mg (for inflammation) and baclofen 10 mg (for muscle relaxer)  If no better in 1-2 weeks or any worsening, let us know  Next step would be referral to sports medicine  I was unable to print your prescription -- I have sent this into the pharmacy. I do not think that you need to start this. If any worsening urinary symptoms however, please do start this. I will be in touch with your urine culture results.  General instructions Make sure you: Pee until your bladder is empty. Do not hold pee for a long time. Empty your bladder after sex. Wipe from front to back after pooping if you are a female. Use each tissue one time when you wipe. Drink enough fluid to keep your pee pale yellow. Keep all follow-up visits as told by your doctor. This is important. Contact a doctor if: You do not get better after 1-2 days. Your symptoms go away and then come back. Get help right away if: You have very bad back pain. You have very bad pain in your lower belly. You have a fever. You are sick to your stomach (nauseous). You are throwing up.   Take care,  Inda Coke PA-C

## 2021-06-26 NOTE — Progress Notes (Signed)
Christine Livingston is a 70 y.o. female here for back pain.   History of Present Illness:   Chief Complaint  Patient presents with  . Back Pain    Patient c/o back pain but no pain today, has been going on for >10 days; Using heating pad to help pain; pain radiating from center of lower back to right side of lower back    HPI  Back Pain Christine Livingston has been experiencing intermittent back pain for years, but has recently worsening for the past ten days. Pt describes the pain as a sudden pulsing sensation that radiates from center of lower back to the right side of lower back. She has also experienced a episode of dizziness that quickly resolved upon her sitting down. In an effort to treat her sx she has been using a heating pad which has provided minor relief. At this time she is not having any pain. Due to hx of frequent UTIs she believed this could be the case, which led her to visiting today.   Christine Livingston has seen Dr. Jerline Pain, PCP, for this issue in December of 2019 where she was given a cortisone shot, trigger point injection, and prescribed mobic 15 mg daily and zanaflex 4 mg TID. At that time she was also referred to Dr. Paulla Fore, sports medicine.  Denies any bowel/ urinary incontinence, hx of kidney stones, CP, SOB, hematuria, hematochezia  Past Medical History:  Diagnosis Date  . Constipation   . Hemorrhoid    banding  . Hypertension   . SVD (spontaneous vaginal delivery)    x 1  . WPW (Wolff-Parkinson-White syndrome)    occasional fast heart beat, pt coughs and goes back to normal, no meds     Social History   Tobacco Use  . Smoking status: Former    Packs/day: 1.00    Types: Cigarettes    Quit date: 10/28/1980    Years since quitting: 40.6  . Smokeless tobacco: Never  Vaping Use  . Vaping Use: Never used  Substance Use Topics  . Alcohol use: Yes    Alcohol/week: 0.0 standard drinks    Comment: occasional wine couple times a month  . Drug use: Never    Past Surgical History:   Procedure Laterality Date  . ANTERIOR AND POSTERIOR REPAIR N/A 05/11/2018   Procedure: ANTERIOR (CYSTOCELE) REPAIR;  Surgeon: Tyson Dense, MD;  Location: [facility];  Service: Gynecology;  Laterality: N/A;  . COLONOSCOPY     polyps  . CYSTOSCOPY  05/11/2018   Procedure: CYSTOSCOPY;  Surgeon: Tyson Dense, MD;  Location: [facility];  Service: Gynecology;;  . Christine Livingston N/A 05/11/2018   Procedure: Christine Livingston;  Surgeon: Bjorn Loser, MD;  Location: [facility];  Service: Urology;  Laterality: N/A;  . laparoscopic drainage of ovarian cyst  1994  . LAPAROSCOPIC VAGINAL HYSTERECTOMY WITH SALPINGO OOPHORECTOMY Bilateral 05/11/2018   Procedure: LAPAROSCOPIC ASSISTED VAGINAL HYSTERECTOMY WITH SALPINGO OOPHORECTOMY, MCCALL'S CULDOPLASTY;  Surgeon: Tyson Dense, MD;  Location: [facility];  Service: Gynecology;  Laterality: Bilateral;  . PUBOVAGINAL SLING N/A 05/11/2018   Procedure: Gaynelle Arabian;  Surgeon: Bjorn Loser, MD;  Location: [facility];  Service: Urology;  Laterality: N/A;    Family History  Problem Relation Age of Onset  . Heart disease Mother   . Heart failure Mother   . Hypertension Father   . Diabetes Sister   . Hypertension Sister   . Diabetes Paternal Grandmother   . Breast cancer Paternal Aunt   . Pancreatic cancer Paternal  Uncle     Allergies  Allergen Reactions  . Bactrim [Sulfamethoxazole-Trimethoprim] Rash and Other (See Comments)    Leg cramp  . Cephalexin Diarrhea  . Nitrofurantoin Diarrhea    Current Medications:   Current Outpatient Medications:  .  amLODipine (NORVASC) 5 MG tablet, Take 1 tablet (5 mg total) by mouth daily., Disp: 90 tablet, Rfl: 1 .  atorvastatin (LIPITOR) 40 MG tablet, Take 1 tablet by mouth once daily, Disp: 90 tablet, Rfl: 0 .  Calcium-Phosphorus-Vitamin D (CITRACAL +D3 PO), Take by mouth., Disp: , Rfl:  .  lisinopril (ZESTRIL) 40 MG tablet, Take 1 tablet by mouth once daily, Disp: 90 tablet, Rfl: 0 .  melatonin 1 MG  TABS tablet, Take 1 mg by mouth at bedtime. Takes 2-3 mg nightly, Disp: , Rfl:  .  Probiotic Product (PROBIOTIC ADVANCED) CAPS, Take by mouth., Disp: , Rfl:  .  Wheat Dextrin (BENEFIBER PO), Take by mouth daily., Disp: , Rfl:    Review of Systems:   ROS Negative unless otherwise specified per HPI.  Vitals:   Vitals:   06/26/21 1133  BP: (!) 143/84  Pulse: 75  SpO2: 98%  Weight: 159 lb 9.6 oz (72.4 kg)  Height: 5\' 7"  (1.702 m)     Body mass index is 25 kg/m.  Physical Exam:   Physical Exam Vitals and nursing note reviewed.  Constitutional:      General: She is not in acute distress.    Appearance: She is well-developed. She is not ill-appearing or toxic-appearing.  Cardiovascular:     Rate and Rhythm: Normal rate and regular rhythm.     Pulses: Normal pulses.     Heart sounds: Normal heart sounds, S1 normal and S2 normal.  Pulmonary:     Effort: Pulmonary effort is normal.     Breath sounds: Normal breath sounds.  Abdominal:     Tenderness: There is no right CVA tenderness or left CVA tenderness.  Musculoskeletal:     Comments: No decreased ROM 2/2 pain with flexion/extension, lateral side bends, or rotation. No reproducible tenderness with deep palpation to bilateral paraspinal muscles. No bony tenderness. No evidence of erythema, rash or ecchymosis.    Skin:    General: Skin is warm and dry.  Neurological:     Mental Status: She is alert.     GCS: GCS eye subscore is 4. GCS verbal subscore is 5. GCS motor subscore is 6.  Psychiatric:        Speech: Speech normal.        Behavior: Behavior normal. Behavior is cooperative.   Results for orders placed or performed in visit on 06/26/21  POCT urinalysis dipstick  Result Value Ref Range   Color, UA yellow    Clarity, UA clear    Glucose, UA Negative Negative   Bilirubin, UA negative    Ketones, UA negative    Spec Grav, UA <=1.005 (A) 1.010 - 1.025   Blood, UA negative    pH, UA 6.0 5.0 - 8.0   Protein, UA  Negative Negative   Urobilinogen, UA 0.2 0.2 or 1.0 E.U./dL   Nitrite, UA negative    Leukocytes, UA Negative Negative   Appearance     Odor       Assessment and Plan:   Low back pain without sciatica, unspecified back pain laterality, unspecified chronicity No red flags UA without significant abnormalities; culture ordered and will provide recommendations based on results accordingly. I did provide pocket rx of augmentin for  urinary symptoms should her symptoms worsen over the holiday weekend. Start meloxicam 7.5 mg and baclofen 10 mg daily prn (sleepy precautions advised) If no better in 1-2 weeks or any worsening, she was advised to let us know. Next step would be referral to sports medicine vs imaging.  I,Christine Livingston,acting as a Education administrator for [employer], PA.,have documented all relevant documentation on the behalf of Inda Coke, PA,as directed by  Inda Coke, PA while in the presence of Inda Coke, [place].  I, Inda Coke, [place], have reviewed all documentation for this visit. The documentation on 06/26/21 for the exam, diagnosis, procedures, and orders are all accurate and complete.  Time spent with patient today was 28 minutes which consisted of chart review, discussing diagnosis, work up, treatment answering questions and documentation.   Inda Coke, PA-C

## 2021-06-28 LAB — URINE CULTURE
MICRO NUMBER:: 12675663
SPECIMEN QUALITY:: ADEQUATE

## 2021-07-01 ENCOUNTER — Ambulatory Visit (INDEPENDENT_AMBULATORY_CARE_PROVIDER_SITE_OTHER): Payer: Medicare PPO | Admitting: Family Medicine

## 2021-07-01 ENCOUNTER — Encounter: Payer: Self-pay | Admitting: Family Medicine

## 2021-07-01 ENCOUNTER — Other Ambulatory Visit: Payer: Self-pay

## 2021-07-01 VITALS — BP 129/78 | HR 68 | Temp 97.6°F | Ht 67.0 in | Wt 160.6 lb

## 2021-07-01 DIAGNOSIS — Z0001 Encounter for general adult medical examination with abnormal findings: Secondary | ICD-10-CM

## 2021-07-01 DIAGNOSIS — G4700 Insomnia, unspecified: Secondary | ICD-10-CM | POA: Diagnosis not present

## 2021-07-01 DIAGNOSIS — F439 Reaction to severe stress, unspecified: Secondary | ICD-10-CM | POA: Diagnosis not present

## 2021-07-01 DIAGNOSIS — E785 Hyperlipidemia, unspecified: Secondary | ICD-10-CM

## 2021-07-01 DIAGNOSIS — M546 Pain in thoracic spine: Secondary | ICD-10-CM | POA: Diagnosis not present

## 2021-07-01 DIAGNOSIS — I10 Essential (primary) hypertension: Secondary | ICD-10-CM

## 2021-07-01 DIAGNOSIS — R739 Hyperglycemia, unspecified: Secondary | ICD-10-CM

## 2021-07-01 LAB — COMPREHENSIVE METABOLIC PANEL
ALT: 65 U/L — ABNORMAL HIGH (ref 0–35)
AST: 40 U/L — ABNORMAL HIGH (ref 0–37)
Albumin: 4.3 g/dL (ref 3.5–5.2)
Alkaline Phosphatase: 73 U/L (ref 39–117)
BUN: 18 mg/dL (ref 6–23)
CO2: 28 mEq/L (ref 19–32)
Calcium: 9.9 mg/dL (ref 8.4–10.5)
Chloride: 107 mEq/L (ref 96–112)
Creatinine, Ser: 0.92 mg/dL (ref 0.40–1.20)
GFR: 63.19 mL/min (ref >60.00)
Glucose, Bld: 117 mg/dL — ABNORMAL HIGH (ref 70–99)
Potassium: 3.9 mEq/L (ref 3.5–5.1)
Sodium: 144 mEq/L (ref 135–145)
Total Bilirubin: 0.8 mg/dL (ref 0.2–1.2)
Total Protein: 6.6 g/dL (ref 6.0–8.3)

## 2021-07-01 LAB — TSH: TSH: 1.29 u[IU]/mL (ref 0.35–5.50)

## 2021-07-01 LAB — CBC
HCT: 39.5 % (ref 36.0–46.0)
Hemoglobin: 13.3 g/dL (ref 12.0–15.0)
MCHC: 33.6 g/dL (ref 30.0–36.0)
MCV: 87.2 fl (ref 78.0–100.0)
Platelets: 205 10*3/uL (ref 150.0–400.0)
RBC: 4.53 Mil/uL (ref 3.87–5.11)
RDW: 13.1 % (ref 11.5–15.5)
WBC: 4.1 10*3/uL (ref 4.0–10.5)

## 2021-07-01 LAB — LIPID PANEL
Cholesterol: 128 mg/dL (ref 0–200)
HDL: 44.7 mg/dL (ref >39.00)
LDL Cholesterol: 64 mg/dL (ref 0–99)
NonHDL: 83.7
Total CHOL/HDL Ratio: 3
Triglycerides: 99 mg/dL (ref 0.0–149.0)
VLDL: 19.8 mg/dL (ref 0.0–40.0)

## 2021-07-01 LAB — HEMOGLOBIN A1C: Hgb A1c MFr Bld: 5.6 % (ref 4.6–6.5)

## 2021-07-01 MED ORDER — ATORVASTATIN CALCIUM 40 MG PO TABS: 40.0000 mg | ORAL_TABLET | Freq: Every day | ORAL | 0 refills | Status: DC

## 2021-07-01 MED ORDER — LISINOPRIL 40 MG PO TABS: 40.0000 mg | ORAL_TABLET | Freq: Every day | ORAL | 0 refills | Status: DC

## 2021-07-01 NOTE — Assessment & Plan Note (Signed)
Has underlying spondylosis.  She has had improvement with baclofen and meloxicam.  If this continues to be an issue will need referral to sports medicine.

## 2021-07-01 NOTE — Assessment & Plan Note (Signed)
She is taking melatonin as needed.  Discussed trial of prescription medication such as trazodone however she deferred for now.

## 2021-07-01 NOTE — Assessment & Plan Note (Addendum)
She is still under quite a bit of caregiver burden related to the care of her father.  We have referred her to behavioral health in the past however she was unable to get established.  She feels like she is okay and declines further referrals for now.

## 2021-07-01 NOTE — Assessment & Plan Note (Signed)
At goal today on amlodipine 5 mg daily and lisinopril 40 mg daily.  Potentially could be contributing to her dizziness.  May consider trial off amlodipine in the future if dizziness persists.

## 2021-07-01 NOTE — Assessment & Plan Note (Signed)
-   check A1c

## 2021-07-01 NOTE — Patient Instructions (Addendum)
It was very nice to see you today!  We will check blood work.  We may need to stop the blood pressure medications if she continues to have dizziness.  Please make sure that you are getting plenty of fluids.  Please send me a message later this week to let me know how your back pain is doing.  If this persist we may need to have you see a sports medicine doctor.  We will see you back in 1 year for your next physical.  Please come back sooner if needed.  Take care, Dr Jerline Pain  PLEASE NOTE:  If you had any lab tests please let us know if you have not heard back within a few days. You may see your results on mychart before we have a chance to review them but we will give you a call once they are reviewed by [place]. If we ordered any referrals today, please let us know if you have not heard from their office within the next week.   Please try these tips to maintain a healthy lifestyle:  Eat at least 3 REAL meals and 1-2 snacks per day.  Aim for no more than 5 hours between eating.  If you eat breakfast, please do so within one hour of getting up.   Each meal should contain half fruits/vegetables, one quarter protein, and one quarter carbs (no bigger than a computer mouse)  Cut down on sweet beverages. This includes juice, soda, and sweet tea.   Drink at least 1 glass of water with each meal and aim for at least 8 glasses per day  Exercise at least 150 minutes every week.    Preventive Care 7 Years and Older, Female Preventive care refers to lifestyle choices and visits with your health care provider that can promote health and wellness. Preventive care visits are also called wellness exams. What can I expect for my preventive care visit? Counseling Your health care provider may ask you questions about your: Medical history, including: Past medical problems. Family medical history. Pregnancy and menstrual history. History of falls. Current health, including: Memory and ability to  understand (cognition). Emotional well-being. Home life and relationship well-being. Sexual activity and sexual health. Lifestyle, including: Alcohol, nicotine or tobacco, and drug use. Access to firearms. Diet, exercise, and sleep habits. Work and work Statistician. Sunscreen use. Safety issues such as seatbelt and bike helmet use. Physical exam Your health care provider will check your: Height and weight. These may be used to calculate your BMI (body mass index). BMI is a measurement that tells if you are at a healthy weight. Waist circumference. This measures the distance around your waistline. This measurement also tells if you are at a healthy weight and may help predict your risk of certain diseases, such as type 2 diabetes and high blood pressure. Heart rate and blood pressure. Body temperature. Skin for abnormal spots. What immunizations do I need? Vaccines are usually given at various ages, according to a schedule. Your health care provider will recommend vaccines for you based on your age, medical history, and lifestyle or other factors, such as travel or where you work. What tests do I need? Screening Your health care provider may recommend screening tests for certain conditions. This may include: Lipid and cholesterol levels. Hepatitis C test. Hepatitis B test. HIV (human immunodeficiency virus) test. STI (sexually transmitted infection) testing, if you are at risk. Lung cancer screening. Colorectal cancer screening. Diabetes screening. This is done by checking your blood sugar (  glucose) after you have not eaten for a while (fasting). Mammogram. Talk with your health care provider about how often you should have regular mammograms. BRCA-related cancer screening. This may be done if you have a family history of breast, ovarian, tubal, or peritoneal cancers. Bone density scan. This is done to screen for osteoporosis. Talk with your health care provider about your test  results, treatment options, and if necessary, the need for more tests. Follow these instructions at home: Eating and drinking  Eat a diet that includes fresh fruits and vegetables, whole grains, lean protein, and low-fat dairy products. Limit your intake of foods with high amounts of sugar, saturated fats, and salt. Take vitamin and mineral supplements as recommended by your health care provider. Do not drink alcohol if your health care provider tells you not to drink. If you drink alcohol: Limit how much you have to 0-1 drink a day. Know how much alcohol is in your drink. In the [place] one drink equals one 12 oz bottle of beer (355 mL), one 5 oz glass of wine (148 mL), or one 1 oz glass of hard liquor (44 mL). Lifestyle Brush your teeth every morning and night with fluoride toothpaste. Floss one time each day. Exercise for at least 30 minutes 5 or more days each week. Do not use any products that contain nicotine or tobacco. These products include cigarettes, chewing tobacco, and vaping devices, such as e-cigarettes. If you need help quitting, ask your health care provider. Do not use drugs. If you are sexually active, practice safe sex. Use a condom or other form of protection in order to prevent STIs. Take aspirin only as told by your health care provider. Make sure that you understand how much to take and what form to take. Work with your health care provider to find out whether it is safe and beneficial for you to take aspirin daily. Ask your health care provider if you need to take a cholesterol-lowering medicine (statin). Find healthy ways to manage stress, such as: Meditation, yoga, or listening to music. Journaling. Talking to a trusted person. Spending time with friends and family. Minimize exposure to UV radiation to reduce your risk of skin cancer. Safety Always wear your seat belt while driving or riding in a vehicle. Do not drive: If you have been drinking alcohol. Do not  ride with someone who has been drinking. When you are tired or distracted. While texting. If you have been using any mind-altering substances or drugs. Wear a helmet and other protective equipment during sports activities. If you have firearms in your house, make sure you follow all gun safety procedures. What's next? Visit your health care provider once a year for an annual wellness visit. Ask your health care provider how often you should have your eyes and teeth checked. Stay up to date on all vaccines. This information is not intended to replace advice given to you by your health care provider. Make sure you discuss any questions you have with your health care provider. Document Revised: 01/16/2021 Document Reviewed: 01/16/2021 Elsevier Patient Education  [place].

## 2021-07-01 NOTE — Progress Notes (Signed)
Chief Complaint:  Christine Livingston is a 70 y.o. female who presents today for her annual comprehensive physical exam.    Assessment/Plan:  New/Acute Problems: Dizziness No red flags.  Symptoms are relatively vague.  Reassuring exam.  Potentially could be side effect of blood pressure medication.  We will check labs today.  Baclofen could also be contributing.  May consider trial off amlodipine if continues to have issues with dizziness on standing.  Encouraged good hydration.  Discussed reasons to return.  Chronic Problems Addressed Today: Essential hypertension At goal today on amlodipine 5 mg daily and lisinopril 40 mg daily.  Potentially could be contributing to her dizziness.  May consider trial off amlodipine in the future if dizziness persists.  Insomnia She is taking melatonin as needed.  Discussed trial of prescription medication such as trazodone however she deferred for now.  Stress She is still under quite a bit of caregiver burden related to the care of her father.  We have referred her to behavioral health in the past however she was unable to get established.  She feels like she is okay and declines further referrals for now.  Hyperglycemia check A1c.    Dyslipidemia Check lipids.  Thoracic back pain Has underlying spondylosis.  She has had improvement with baclofen and meloxicam.  If this continues to be an issue will need referral to sports medicine.  Preventative Healthcare: Check labs. UTD on cancer screening and vaccines.   Patient Counseling(The following topics were reviewed and/or handout was given):  -Nutrition: Stressed importance of moderation in sodium/caffeine intake, saturated fat and cholesterol, caloric balance, sufficient intake of fresh fruits, vegetables, and fiber.  -Stressed the importance of regular exercise.   -Substance Abuse: Discussed cessation/primary prevention of tobacco, alcohol, or other drug use; driving or other dangerous  activities under the influence; availability of treatment for abuse.   -Injury prevention: Discussed safety belts, safety helmets, smoke detector, smoking near bedding or upholstery.   -Sexuality: Discussed sexually transmitted diseases, partner selection, use of condoms, avoidance of unintended pregnancy and contraceptive alternatives.   -Dental health: Discussed importance of regular tooth brushing, flossing, and dental visits.  -Health maintenance and immunizations reviewed. Please refer to Health maintenance section.  Return to care in 1 year for next preventative visit.     Subjective:  HPI:  Please A/P for status of chronic conditions  Patient states that she has not felt well for the last several weeks.  She had a visit here last week with a different provider.  Is having some back pain.  Urine culture rule out UTI.  She started on baclofen and meloxicam.  Symptoms seem to be improving.  Is not having any pain today though still has intermittent pain in mid to lower back.  She has had some issues with difficulty sleeping over the last few days.  Had 1 episode of diarrhea.  She has also occasionally had some dizziness on standing up too fast.  This has been going on for several months.  No loss of consciousness.  No weakness or numbness.  She has had more sinus and nasal congestion.  Lifestyle Diet: Balanced.  Exercise: Tries to walk.  Depression screen [facility] 2/9 07/01/2021  Decreased Interest 0  Down, Depressed, Hopeless 0  PHQ - 2 Score 0  Altered sleeping -  Tired, decreased energy -  Change in appetite -  Feeling bad or failure about yourself  -  Trouble concentrating -  Moving slowly or fidgety/restless -  Suicidal  thoughts -  PHQ-9 Score -    There are no preventive care reminders to display for this patient.    ROS: Per HPI, otherwise a complete review of systems was negative.   PMH:  The following were reviewed and entered/updated in epic: Past Medical History:   Diagnosis Date   Constipation    Hemorrhoid    banding   Hypertension    SVD (spontaneous vaginal delivery)    x 1   WPW (Wolff-Parkinson-White syndrome)    occasional fast heart beat, pt coughs and goes back to normal, no meds   Patient Active Problem List   Diagnosis Date Noted   Insomnia 07/01/2021   Diverticulosis 03/26/2021   Stress 12/04/2020   Hyperglycemia 06/26/2020   Paroxysmal SVT (supraventricular tachycardia) ([place]) 06/25/2020   Dyslipidemia 06/27/2019   Thoracic back pain 06/21/2018   Post-menopause 06/21/2018   S/P hysterectomy with oophorectomy 05/11/2018   Prolapsed uterus 08/25/2017   External hemorrhoids 01/07/2017   Wolff-Parkinson-White (WPW) syndrome 01/07/2017   History of colonic polyps 01/30/2015   Essential hypertension 02/01/2007   Past Surgical History:  Procedure Laterality Date   ANTERIOR AND POSTERIOR REPAIR N/A 05/11/2018   Procedure: ANTERIOR (CYSTOCELE) REPAIR;  Surgeon: Tyson Dense, MD;  Location: [facility];  Service: Gynecology;  Laterality: N/A;   COLONOSCOPY     polyps   CYSTOSCOPY  05/11/2018   Procedure: CYSTOSCOPY;  Surgeon: Tyson Dense, MD;  Location: [facility];  Service: Gynecology;;   CYSTOSCOPY N/A 05/11/2018   Procedure: Consuela Mimes;  Surgeon: Bjorn Loser, MD;  Location: [facility];  Service: Urology;  Laterality: N/A;   laparoscopic drainage of ovarian cyst  1994   LAPAROSCOPIC VAGINAL HYSTERECTOMY WITH SALPINGO OOPHORECTOMY Bilateral 05/11/2018   Procedure: LAPAROSCOPIC ASSISTED VAGINAL HYSTERECTOMY WITH SALPINGO OOPHORECTOMY, MCCALL'S CULDOPLASTY;  Surgeon: Tyson Dense, MD;  Location: [facility];  Service: Gynecology;  Laterality: Bilateral;   PUBOVAGINAL SLING N/A 05/11/2018   Procedure: Gaynelle Arabian;  Surgeon: Bjorn Loser, MD;  Location: [facility];  Service: Urology;  Laterality: N/A;    Family History  Problem Relation Age of Onset   Heart disease Mother    Heart failure Mother     Hypertension Father    Diabetes Sister    Hypertension Sister    Diabetes Paternal Grandmother    Breast cancer Paternal Aunt    Pancreatic cancer Paternal Uncle     Medications- reviewed and updated Current Outpatient Medications  Medication Sig Dispense Refill   amLODipine (NORVASC) 5 MG tablet Take 1 tablet (5 mg total) by mouth daily. 90 tablet 1   atorvastatin (LIPITOR) 40 MG tablet Take 1 tablet by mouth once daily 90 tablet 0   baclofen (LIORESAL) 10 MG tablet Take 1 tablet (10 mg total) by mouth 2 (two) times daily as needed for muscle spasms. 30 each 0   Calcium-Phosphorus-Vitamin D (CITRACAL +D3 PO) Take by mouth.     lisinopril (ZESTRIL) 40 MG tablet Take 1 tablet by mouth once daily 90 tablet 0   melatonin 1 MG TABS tablet Take 1 mg by mouth at bedtime. Takes 2-3 mg nightly     meloxicam (MOBIC) 7.5 MG tablet Take 1 tablet (7.5 mg total) by mouth daily. 30 tablet 0   Probiotic Product (PROBIOTIC ADVANCED) CAPS Take by mouth.     Wheat Dextrin (BENEFIBER PO) Take by mouth daily.     No current facility-administered medications for this visit.    Allergies-reviewed and updated Allergies  Allergen Reactions  Bactrim [Sulfamethoxazole-Trimethoprim] Rash and Other (See Comments)    Leg cramp   Cephalexin Diarrhea   Nitrofurantoin Diarrhea    Social History   Socioeconomic History   Marital status: Married    Spouse name: Not on file   Number of children: 1   Years of education: Not on file   Highest education level: Not on file  Occupational History   Occupation: Retired     Comment: Pharmacist, hospital   Tobacco Use   Smoking status: Former    Packs/day: 1.00    Types: Cigarettes    Quit date: 10/28/1980    Years since quitting: 40.7   Smokeless tobacco: Never  Vaping Use   Vaping Use: Never used  Substance and Sexual Activity   Alcohol use: Yes    Alcohol/week: 0.0 standard drinks    Comment: occasional wine couple times a month   Drug use: Never   Sexual  activity: Not on file  Other Topics Concern   Not on file  Social History Narrative   Caregiver for 69 y.o father   Social Determinants of Health   Financial Resource Strain: Low Risk    Difficulty of Paying Living Expenses: Not hard at all  Food Insecurity: No Food Insecurity   Worried About Charity fundraiser in the Last Year: Never true   Arboriculturist in the Last Year: Never true  Transportation Needs: No Transportation Needs   Lack of Transportation (Medical): No   Lack of Transportation (Non-Medical): No  Physical Activity: Insufficiently Active   Days of Exercise per Week: 3 days   Minutes of Exercise per Session: 30 min  Stress: Stress Concern Present   Feeling of Stress : To some extent  Social Connections: Moderately Isolated   Frequency of Communication with Friends and Family: Once a week   Frequency of Social Gatherings with Friends and Family: Once a week   Attends Religious Services: 1 to 4 times per year   Active Member of [employer] or Organizations: No   Attends Music therapist: Never   Marital Status: Married        Objective:  Physical Exam: BP 129/78   Pulse 68   Temp 97.6 F (36.4 C) (Temporal)   Ht 5\' 7"  (1.702 m)   Wt 160 lb 9.6 oz (72.8 kg)   LMP  (LMP Unknown)   SpO2 97%   BMI 25.15 kg/m   Body mass index is 25.15 kg/m. Wt Readings from Last 3 Encounters:  07/01/21 160 lb 9.6 oz (72.8 kg)  06/26/21 159 lb 9.6 oz (72.4 kg)  04/01/21 160 lb 6.4 oz (72.8 kg)   Gen: NAD, resting comfortably HEENT: TMs normal bilaterally. OP clear. No thyromegaly noted.  CV: RRR with no murmurs appreciated Pulm: NWOB, CTAB with no crackles, wheezes, or rhonchi GI: Normal bowel sounds present. Soft, Nontender, Nondistended. MSK: no edema, cyanosis, or clubbing noted - Back: No deformities.  Nontender to palpation.  Neurovascularly intact in lower extremities Skin: warm, dry Neuro: CN2-12 grossly intact. Strength 5/5 in upper and lower  extremities. Reflexes symmetric and intact bilaterally.  Psych: Normal affect and thought content     Christinea Brizuela M. Jerline Pain, MD 07/01/2021 10:14 AM

## 2021-07-01 NOTE — Assessment & Plan Note (Signed)
Check lipids 

## 2021-07-02 NOTE — Progress Notes (Signed)
Please inform patient of the following:  Her liver numbers were up a little bit but everything else was STABLE. Would like for her to come back in 1-2 weeks to recheck CMET. We can recheck everything else in a year.  Would like for her to let us know if her dizziness is not improving off the baclofen and we can discuss stopping one of her blood pressure medications.   Christine Livingston. Jerline Pain, MD 07/02/2021 7:56 AM

## 2021-07-03 ENCOUNTER — Encounter: Payer: Self-pay | Admitting: Family Medicine

## 2021-07-10 ENCOUNTER — Encounter: Payer: Self-pay | Admitting: Family Medicine

## 2021-07-10 ENCOUNTER — Other Ambulatory Visit: Payer: Self-pay | Admitting: *Deleted

## 2021-07-10 DIAGNOSIS — R748 Abnormal levels of other serum enzymes: Secondary | ICD-10-CM

## 2021-07-10 NOTE — Telephone Encounter (Signed)
FYI

## 2021-07-17 ENCOUNTER — Other Ambulatory Visit (INDEPENDENT_AMBULATORY_CARE_PROVIDER_SITE_OTHER): Payer: Medicare PPO

## 2021-07-17 DIAGNOSIS — R748 Abnormal levels of other serum enzymes: Secondary | ICD-10-CM

## 2021-07-17 LAB — COMPREHENSIVE METABOLIC PANEL
ALT: 63 U/L — ABNORMAL HIGH (ref 0–35)
AST: 36 U/L (ref 0–37)
Albumin: 4.4 g/dL (ref 3.5–5.2)
Alkaline Phosphatase: 77 U/L (ref 39–117)
BUN: 13 mg/dL (ref 6–23)
CO2: 29 mEq/L (ref 19–32)
Calcium: 9.5 mg/dL (ref 8.4–10.5)
Chloride: 104 mEq/L (ref 96–112)
Creatinine, Ser: 0.87 mg/dL (ref 0.40–1.20)
GFR: 67.55 mL/min (ref >60.00)
Glucose, Bld: 101 mg/dL — ABNORMAL HIGH (ref 70–99)
Potassium: 3.6 mEq/L (ref 3.5–5.1)
Sodium: 142 mEq/L (ref 135–145)
Total Bilirubin: 0.9 mg/dL (ref 0.2–1.2)
Total Protein: 7 g/dL (ref 6.0–8.3)

## 2021-07-18 NOTE — Progress Notes (Signed)
Please inform patient of the following:  Liver numbers are improving but still elevated. This is probably mild fatty liver but I think we should check a liver ultrasound to make sure there is nothing else going on. Please place order for RUQ ultrasound.  Christine Livingston. Jerline Pain, MD 07/18/2021 8:10 AM

## 2021-07-19 ENCOUNTER — Other Ambulatory Visit: Payer: Self-pay | Admitting: *Deleted

## 2021-07-19 DIAGNOSIS — R748 Abnormal levels of other serum enzymes: Secondary | ICD-10-CM

## 2021-07-24 DIAGNOSIS — D23112 Other benign neoplasm of skin of right lower eyelid, including canthus: Secondary | ICD-10-CM | POA: Diagnosis not present

## 2021-07-24 DIAGNOSIS — H43393 Other vitreous opacities, bilateral: Secondary | ICD-10-CM | POA: Diagnosis not present

## 2021-07-24 DIAGNOSIS — H2513 Age-related nuclear cataract, bilateral: Secondary | ICD-10-CM | POA: Diagnosis not present

## 2021-08-05 ENCOUNTER — Other Ambulatory Visit: Payer: Self-pay | Admitting: Family Medicine

## 2021-08-09 ENCOUNTER — Ambulatory Visit
Admission: RE | Admit: 2021-08-09 | Discharge: 2021-08-09 | Disposition: A | Discharge status: Home / Self Care | Payer: Medicare PPO | Source: Ambulatory Visit | Attending: Family Medicine | Admitting: Family Medicine

## 2021-08-09 DIAGNOSIS — R945 Abnormal results of liver function studies: Secondary | ICD-10-CM | POA: Diagnosis not present

## 2021-08-09 DIAGNOSIS — R748 Abnormal levels of other serum enzymes: Secondary | ICD-10-CM

## 2021-08-12 NOTE — Progress Notes (Signed)
Please inform patient of the following:  Good news! Her ultrasound is NORMAL. Do not need to do any further testing at this time. We can recheck her liver numbers at her next physical. She should continue working on diet and exercise.

## 2021-09-03 ENCOUNTER — Ambulatory Visit: Payer: Medicare PPO | Admitting: Family Medicine

## 2021-09-03 ENCOUNTER — Encounter: Payer: Self-pay | Admitting: Family Medicine

## 2021-09-03 ENCOUNTER — Other Ambulatory Visit: Payer: Self-pay

## 2021-09-03 VITALS — BP 126/84 | HR 78 | Temp 97.5°F | Ht 67.0 in | Wt 157.8 lb

## 2021-09-03 DIAGNOSIS — F439 Reaction to severe stress, unspecified: Secondary | ICD-10-CM | POA: Diagnosis not present

## 2021-09-03 DIAGNOSIS — N390 Urinary tract infection, site not specified: Secondary | ICD-10-CM

## 2021-09-03 DIAGNOSIS — I10 Essential (primary) hypertension: Secondary | ICD-10-CM

## 2021-09-03 LAB — POCT URINALYSIS DIPSTICK
Bilirubin, UA: NEGATIVE
Blood, UA: NEGATIVE
Glucose, UA: NEGATIVE
Ketones, UA: NEGATIVE
Leukocytes, UA: NEGATIVE
Nitrite, UA: NEGATIVE
Protein, UA: NEGATIVE
Spec Grav, UA: 1.01 (ref 1.010–1.025)
Urobilinogen, UA: 0.2 E.U./dL
pH, UA: 6.5 (ref 5.0–8.0)

## 2021-09-03 NOTE — Assessment & Plan Note (Signed)
At goal on amlodipine 5 mg daily lisinopril 40 mg daily.

## 2021-09-03 NOTE — Assessment & Plan Note (Signed)
Father recently passed away.  She is going through bereavement process. She feels like she is handling it well and has good support systems in place.  She does not feel like she needs referral to behavioral health specialist this point though will let us know if any needs arise.

## 2021-09-03 NOTE — Patient Instructions (Signed)
It was very nice to see you today!  We will check a urine culture and call you once the results come back.  Please let us know if your symptoms worsen.  Take care, Dr Jerline Pain  PLEASE NOTE:  If you had any lab tests please let us know if you have not heard back within a few days. You may see your results on mychart before we have a chance to review them but we will give you a call once they are reviewed by [place]. If we ordered any referrals today, please let us know if you have not heard from their office within the next week.   Please try these tips to maintain a healthy lifestyle:  Eat at least 3 REAL meals and 1-2 snacks per day.  Aim for no more than 5 hours between eating.  If you eat breakfast, please do so within one hour of getting up.   Each meal should contain half fruits/vegetables, one quarter protein, and one quarter carbs (no bigger than a computer mouse)  Cut down on sweet beverages. This includes juice, soda, and sweet tea.   Drink at least 1 glass of water with each meal and aim for at least 8 glasses per day  Exercise at least 150 minutes every week.

## 2021-09-03 NOTE — Progress Notes (Signed)
° °  Christine Livingston is a 71 y.o. female who presents today for an office visit.  Assessment/Plan:  New/Acute Problems: Urinary frequency UA negative.  Check urine culture to rule out UTI.  Chronic Problems Addressed Today: No problem-specific Assessment & Plan notes found for this encounter.     Subjective:  HPI:  Patient here with concern for possible UTI. Her symptoms included frequent urination. She notes she has issue with urgency for a while. She does have a hx of frequent UTI. Symptoms consistent with prior UTI. No treatment tried. No precipating events. Denies nausea or vomiting. He denies hematuria or dysuria. No fever ,chill, or nausea.   She is grieving as she recently lost her father. She have a good family support. She is otherwise doing well and have no other complaints today.        Objective:  Physical Exam: LMP  (LMP Unknown)   Gen: No acute distress, resting comfortably CV: Regular rate and rhythm with no murmurs appreciated Pulm: Normal work of breathing, clear to auscultation bilaterally with no crackles, wheezes, or rhonchi Neuro: Grossly normal, moves all extremities Psych: Normal affect and thought content       I,Savera Zaman,acting as a scribe for Dimas Chyle, MD.,have documented all relevant documentation on the behalf of Dimas Chyle, MD,as directed by  Dimas Chyle, MD while in the presence of Dimas Chyle, MD.   I, Dimas Chyle, MD, have reviewed all documentation for this visit. The documentation on 09/03/21 for the exam, diagnosis, procedures, and orders are all accurate and complete.  Algis Greenhouse. Jerline Pain, MD 09/03/2021 10:02 AM

## 2021-09-06 ENCOUNTER — Other Ambulatory Visit: Payer: Self-pay | Admitting: *Deleted

## 2021-09-06 LAB — URINE CULTURE
MICRO NUMBER:: 12943137
SPECIMEN QUALITY:: ADEQUATE

## 2021-09-06 MED ORDER — CIPROFLOXACIN HCL 500 MG PO TABS: 500.0000 mg | ORAL_TABLET | Freq: Two times a day (BID) | ORAL | 0 refills | Status: AC

## 2021-09-06 NOTE — Progress Notes (Signed)
Please inform patient of the following:  Urine culture shows UTI. Recommend starting cipro 500mg  bid x 3 days.  Would like for her to let us know if her symptoms are not improving.   Algis Greenhouse. Jerline Pain, MD 09/06/2021 7:55 AM

## 2021-11-07 ENCOUNTER — Other Ambulatory Visit: Payer: Self-pay | Admitting: Family Medicine

## 2021-11-18 DIAGNOSIS — Z1231 Encounter for screening mammogram for malignant neoplasm of breast: Secondary | ICD-10-CM | POA: Diagnosis not present

## 2021-11-18 LAB — HM MAMMOGRAPHY

## 2021-11-20 ENCOUNTER — Encounter: Payer: Self-pay | Admitting: Family Medicine

## 2021-12-27 ENCOUNTER — Other Ambulatory Visit: Payer: Self-pay | Admitting: Family Medicine

## 2021-12-30 ENCOUNTER — Other Ambulatory Visit: Payer: Self-pay | Admitting: Family Medicine

## 2022-01-26 ENCOUNTER — Telehealth: Payer: Medicare PPO | Admitting: Family

## 2022-01-26 DIAGNOSIS — R399 Unspecified symptoms and signs involving the genitourinary system: Secondary | ICD-10-CM | POA: Diagnosis not present

## 2022-01-26 MED ORDER — CIPROFLOXACIN HCL 500 MG PO TABS: 500.0000 mg | ORAL_TABLET | Freq: Two times a day (BID) | ORAL | 0 refills | Status: AC

## 2022-01-27 ENCOUNTER — Encounter: Payer: Self-pay | Admitting: Family

## 2022-01-27 ENCOUNTER — Ambulatory Visit: Payer: Medicare PPO | Admitting: Family

## 2022-01-27 VITALS — BP 159/81 | HR 70 | Temp 97.5°F | Ht 67.0 in | Wt 162.5 lb

## 2022-01-27 DIAGNOSIS — M546 Pain in thoracic spine: Secondary | ICD-10-CM

## 2022-01-27 DIAGNOSIS — R350 Frequency of micturition: Secondary | ICD-10-CM | POA: Diagnosis not present

## 2022-01-27 LAB — POCT URINALYSIS DIPSTICK
Bilirubin, UA: NEGATIVE
Blood, UA: NEGATIVE
Glucose, UA: NEGATIVE
Ketones, UA: NEGATIVE
Leukocytes, UA: NEGATIVE
Nitrite, UA: NEGATIVE
Protein, UA: NEGATIVE
Spec Grav, UA: 1.015 (ref 1.010–1.025)
Urobilinogen, UA: 0.2 E.U./dL
pH, UA: 6.5 (ref 5.0–8.0)

## 2022-01-27 MED ORDER — METHYLPREDNISOLONE ACETATE 80 MG/ML IJ SUSP
80.0000 mg | Freq: Once | INTRAMUSCULAR | Status: AC
  Administered 2022-01-27: 80 mg via INTRAMUSCULAR

## 2022-01-27 MED ORDER — CYCLOBENZAPRINE HCL 5 MG PO TABS: 5.0000 mg | ORAL_TABLET | Freq: Every evening | ORAL | 0 refills | Status: DC | PRN

## 2022-03-31 ENCOUNTER — Other Ambulatory Visit: Payer: Self-pay | Admitting: Family Medicine

## 2022-04-22 ENCOUNTER — Encounter: Payer: Self-pay | Admitting: Family Medicine

## 2022-04-22 ENCOUNTER — Ambulatory Visit: Payer: Medicare PPO | Admitting: Family Medicine

## 2022-04-22 VITALS — BP 128/69 | HR 76 | Temp 97.9°F | Ht 67.0 in | Wt 166.6 lb

## 2022-04-22 DIAGNOSIS — M546 Pain in thoracic spine: Secondary | ICD-10-CM | POA: Diagnosis not present

## 2022-04-22 DIAGNOSIS — I10 Essential (primary) hypertension: Secondary | ICD-10-CM

## 2022-04-22 NOTE — Assessment & Plan Note (Signed)
At goal today on amlodipine 5 mg daily and lisinopril 40 mg daily.

## 2022-04-22 NOTE — Progress Notes (Signed)
   Christine Livingston is a 71 y.o. female who presents today for an office visit.  Assessment/Plan:  Chronic Problems Addressed Today: Thoracic back pain Has seen sports medicine for this a few years ago.  She does have underlying spondylosis.  Symptoms have improved significantly and is not having any current significant pain.  We discussed management strategies going forward.  She can continue using the Flexeril as needed.  She can also use heating pads.  We will place referral to physical therapy.  If this continues to be an issue would consider referral to orthopedics or sports medicine.  Essential hypertension At goal today on amlodipine 5 mg daily and lisinopril 40 mg daily.     Subjective:  HPI:  Patient here for follow up.  Her primary concern today is back pain. This has been a long standing issue and most recently flared up a few weeks ago. It has actually improved the last several days.  She has had episodes in the past that resolved with NSAIDs and muscle relaxers.  No clear or obvious precipitating events.  She has seen sports medicine for this several years ago.       Objective:  Physical Exam: BP 128/69   Pulse 76   Temp 97.9 F (36.6 C) (Temporal)   Ht '5\' 7"'$  (1.702 m)   Wt 166 lb 9.6 oz (75.6 kg)   LMP  (LMP Unknown)   SpO2 98%   BMI 26.09 kg/m   Gen: No acute distress, resting comfortably CV: Regular rate and rhythm with no murmurs appreciated Pulm: Normal work of breathing, clear to auscultation bilaterally with no crackles, wheezes, or rhonchi Neuro: Grossly normal, moves all extremities Psych: Normal affect and thought content      Tamari Busic M. Jerline Pain, MD 04/22/2022 11:05 AM

## 2022-04-22 NOTE — Assessment & Plan Note (Signed)
Has seen sports medicine for this a few years ago.  She does have underlying spondylosis.  Symptoms have improved significantly and is not having any current significant pain.  We discussed management strategies going forward.  She can continue using the Flexeril as needed.  She can also use heating pads.  We will place referral to physical therapy.  If this continues to be an issue would consider referral to orthopedics or sports medicine.

## 2022-04-22 NOTE — Patient Instructions (Signed)
It was very nice to see you today!  I will refer you to see a physical therapist.  I will see back in a few months for your annual checkup.  Come back sooner if needed.  Take care, Dr Jerline Pain  PLEASE NOTE:  If you had any lab tests please let us know if you have not heard back within a few days. You may see your results on mychart before we have a chance to review them but we will give you a call once they are reviewed by [place]. If we ordered any referrals today, please let us know if you have not heard from their office within the next week.   Please try these tips to maintain a healthy lifestyle:  Eat at least 3 REAL meals and 1-2 snacks per day.  Aim for no more than 5 hours between eating.  If you eat breakfast, please do so within one hour of getting up.   Each meal should contain half fruits/vegetables, one quarter protein, and one quarter carbs (no bigger than a computer mouse)  Cut down on sweet beverages. This includes juice, soda, and sweet tea.   Drink at least 1 glass of water with each meal and aim for at least 8 glasses per day  Exercise at least 150 minutes every week.

## 2022-05-06 ENCOUNTER — Ambulatory Visit: Payer: Medicare PPO | Admitting: Physical Therapy

## 2022-05-06 DIAGNOSIS — M5459 Other low back pain: Secondary | ICD-10-CM

## 2022-05-06 NOTE — Therapy (Addendum)
OUTPATIENT PHYSICAL THERAPY THORACOLUMBAR EVALUATION   Patient Name: Christine Livingston MRN: [id number] DOB:12-Oct-1950, 71 y.o., female Today's Date: 05/06/2022   PT End of Session - 05/08/22 1409     Visit Number 1    Number of Visits 16    Date for PT Re-Evaluation 07/01/22    Authorization Type [employer]    PT Start Time 1103    PT Stop Time [id number]    PT Time Calculation (min) 42 min    Activity Tolerance Patient tolerated treatment well    Behavior During Therapy WFL for tasks assessed/performed             Past Medical History:  Diagnosis Date   Constipation    Hemorrhoid    banding   Hypertension    SVD (spontaneous vaginal delivery)    x 1   WPW (Wolff-Parkinson-White syndrome)    occasional fast heart beat, pt coughs and goes back to normal, no meds   Past Surgical History:  Procedure Laterality Date   ANTERIOR AND POSTERIOR REPAIR N/A 05/11/2018   Procedure: ANTERIOR (CYSTOCELE) REPAIR;  Surgeon: Tyson Dense, MD;  Location: [facility];  Service: Gynecology;  Laterality: N/A;   COLONOSCOPY     polyps   CYSTOSCOPY  05/11/2018   Procedure: CYSTOSCOPY;  Surgeon: Tyson Dense, MD;  Location: [facility];  Service: Gynecology;;   CYSTOSCOPY N/A 05/11/2018   Procedure: Consuela Mimes;  Surgeon: Bjorn Loser, MD;  Location: [facility];  Service: Urology;  Laterality: N/A;   laparoscopic drainage of ovarian cyst  1994   LAPAROSCOPIC VAGINAL HYSTERECTOMY WITH SALPINGO OOPHORECTOMY Bilateral 05/11/2018   Procedure: LAPAROSCOPIC ASSISTED VAGINAL HYSTERECTOMY WITH SALPINGO OOPHORECTOMY, MCCALL'S CULDOPLASTY;  Surgeon: Tyson Dense, MD;  Location: [facility];  Service: Gynecology;  Laterality: Bilateral;   PUBOVAGINAL SLING N/A 05/11/2018   Procedure: Gaynelle Arabian;  Surgeon: Bjorn Loser, MD;  Location: [facility];  Service: Urology;  Laterality: N/A;   Patient Active Problem List   Diagnosis Date Noted   Insomnia 07/01/2021   Diverticulosis 03/26/2021    Stress 12/04/2020   Hyperglycemia 06/26/2020   Paroxysmal SVT (supraventricular tachycardia) 06/25/2020   Dyslipidemia 06/27/2019   Thoracic back pain 06/21/2018   Post-menopause 06/21/2018   S/P hysterectomy with oophorectomy 05/11/2018   Prolapsed uterus 08/25/2017   External hemorrhoids 01/07/2017   Wolff-Parkinson-White (WPW) syndrome 01/07/2017   History of colonic polyps 01/30/2015   Essential hypertension 02/01/2007    PCP: Dimas Chyle  REFERRING PROVIDER: Dimas Chyle  REFERRING DIAG: Back pain  Rationale for Evaluation and Treatment Rehabilitation  THERAPY DIAG:  Other low back pain  ONSET DATE:   SUBJECTIVE:  SUBJECTIVE STATEMENT: For many years, pt states she has had back pain. Severe back pain, moves around, L or R, lately on R. Keeps her awake at night. Did have pain up into neck on L.  Gets re-occurring UTI, thinks she has pain at those times as well.  Usually subsides in a couple days.  Rides bike couple times/wk, walks 30 min few times week. Has not had any other PT/treatment for this.  Not having pain today.     PERTINENT HISTORY:     PAIN:  Are you having pain? Yes: NPRS scale: up to 8 /10 Pain location: thoracic/back Pain description: tight, spasm Aggravating factors: none stated.  Relieving factors: heating pad Not hurting today, or in last 2 weeks,    PRECAUTIONS: None  WEIGHT BEARING RESTRICTIONS No  FALLS:  Has patient fallen in last 6 months? No  PLOF: Independent  PATIENT GOALS      OBJECTIVE:   DIAGNOSTIC FINDINGS:    COGNITION:  Overall cognitive status: Within functional limits for tasks assessed      POSTURE: No Significant postural limitations  PALPATION: No pain to palpate Hypomobile t and l spine with PA s Tightness in bil QL    LUMBAR ROM:   Active  A/PROM  eval  Flexion WFL  Extension Mild/mod limitation  Right lateral flexion WFl  Left lateral flexion WFl  Right rotation   Left rotation    (Blank rows = not tested)  LOWER EXTREMITY ROM:     Hips: WFL, Knees: WFL  LOWER EXTREMITY MMT:    MMT Right eval Left eval  Hip flexion 4 4  Hip extension    Hip abduction 4 4  Hip adduction    Hip internal rotation    Hip external rotation 4 4  Knee flexion 5 5  Knee extension 5 5  Ankle dorsiflexion    Ankle plantarflexion    Ankle inversion    Ankle eversion     (Blank rows = not tested)  LUMBAR SPECIAL TESTS:    TODAY'S TREATMENT  See below for HEP   PATIENT EDUCATION:  Education details: PT POC, Exam findings, HEP  Person educated: Patient Education method: Explanation, Demonstration, Tactile cues, Verbal cues, and Handouts Education comprehension: verbalized understanding, returned demonstration, verbal cues required, tactile cues required, and needs further education   HOME EXERCISE PROGRAM: Access Code: [email or web address] URL: [email or web address] Date: 05/06/2022 Prepared by: Lyndee Hensen  Exercises - Supine Lower Trunk Rotation  - 2 x daily - 10 reps - 5-10 hold - Supine Single Knee to Chest Stretch  - 2 x daily - 3 reps - 30 hold - Cat Cow  - 2 x daily - 1-2 sets - 10 reps - 10 hold - Child's Pose with Sidebending  - 2 x daily - 3 reps - 30 hold - Seated Child's Pose with Table  - 2 x daily - 3 reps - 30 hold - Seated Hamstring Stretch  - 2 x daily - 3 reps - 30 hold  ASSESSMENT:  CLINICAL IMPRESSION: Patient presents with primary complaint of intermittent pain in low back. She has re-occurring episodes of back pain that have been very bothersome. Today she has no pain. Pt likely getting increased muscle spasms and pain from certain motions or positions. She has lack of effective HEP for her dx, and will benefit from education on this for best outcome. Pt to  benefit from skilled PT to improve deficits, and help prevent further episodes of back pain.  OBJECTIVE IMPAIRMENTS decreased activity tolerance, decreased ROM, decreased strength, increased muscle spasms, impaired flexibility, improper body mechanics, and pain.   ACTIVITY LIMITATIONS carrying, lifting, bending, squatting, and locomotion level  PARTICIPATION LIMITATIONS: cleaning, laundry, community activity, and yard work  PERSONAL FACTORS Time since onset of injury/illness/exacerbation are also affecting patient's functional outcome.   REHAB POTENTIAL: Good  CLINICAL DECISION MAKING: Stable/uncomplicated  EVALUATION COMPLEXITY: Low   GOALS: Goals reviewed with patient? Yes  SHORT TERM GOALS: Target date: 05/20/22  Pt to be independent with initial HEP  Goal status: INITIAL    LONG TERM GOALS: Target date: 07/01/2022  Pt to be independent with final HEP  Goal status: INITIAL  2.  Pt to report pain 0-2/10 with activity and IADLs   Goal status: INITIAL    PLAN: PT FREQUENCY: 1-2x/week  PT DURATION: 6 weeks  PLANNED INTERVENTIONS: Therapeutic exercises, Therapeutic activity, Neuromuscular re-education, Patient/Family education, Self Care, Joint mobilization, Joint manipulation, Stair training, DME instructions, Aquatic Therapy, Dry Needling, Electrical stimulation, Spinal manipulation, Spinal mobilization, Cryotherapy, Moist heat, Taping, Vasopneumatic device, Traction, Ultrasound, Ionotophoresis '4mg'$ /ml Dexamethasone, and Manual therapy.  PLAN FOR NEXT SESSION:  review and update HEP.    Lyndee Hensen, PT, DPT 2:12 PM  05/08/22  PHYSICAL THERAPY DISCHARGE SUMMARY  Visits from Start of Care: 1  Plan: Patient agrees to discharge.  Patient goals were not met. Patient is being discharged due to  - Pt did not return after last visit.    Lyndee Hensen, PT, DPT 1:51 PM  10/02/22

## 2022-05-07 ENCOUNTER — Other Ambulatory Visit: Payer: Self-pay | Admitting: Family Medicine

## 2022-05-08 ENCOUNTER — Encounter: Payer: Self-pay | Admitting: Physical Therapy

## 2022-05-09 IMAGING — CT CT HEAD W/O CM
4 series · 15 of 47 positions shown, 17 images · non-contrast
Comparison: No pertinent prior exams available for comparison.

CLINICAL DATA: Headache, intracranial hemorrhage suspected.
Headache and hypertension.

EXAM:
CT HEAD WITHOUT CONTRAST
TECHNIQUE: Contiguous axial images were obtained from the base of the skull
through the vertex without intravenous contrast.

[Series 3: head bone · axial · 0.44mm/px · z∈[-88,-74]mm · 2 of 74 slices shown]
[im 8/74  bone]
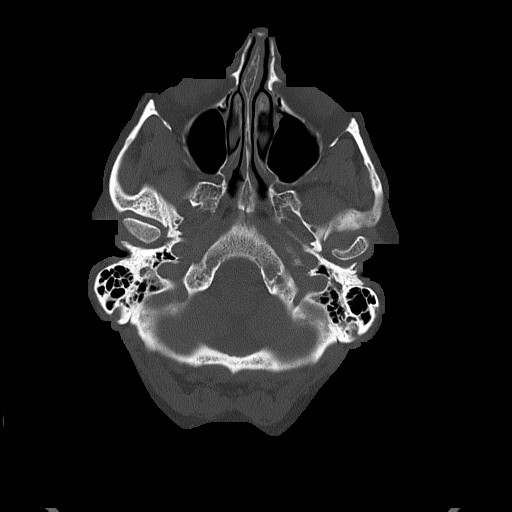
[im 15/74  bone]
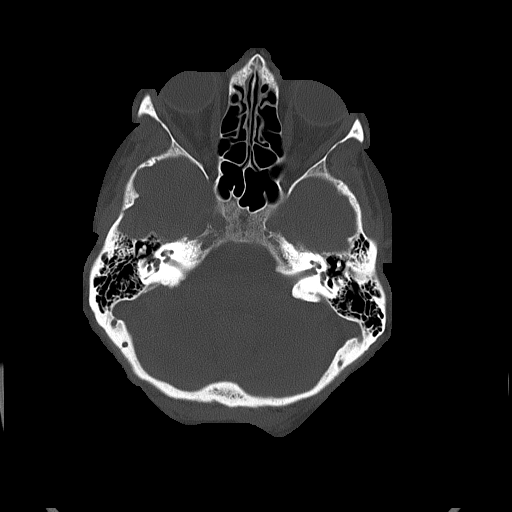

[Series 4: head without · axial · non-contrast · 0.44mm/px · z∈[-87,+23]mm · 7 of 30 slices shown, 9 images]
[im 4/30  brain]
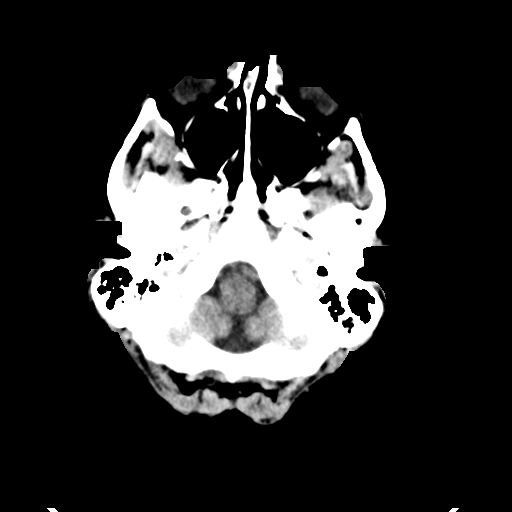
[im 4/30  bone]
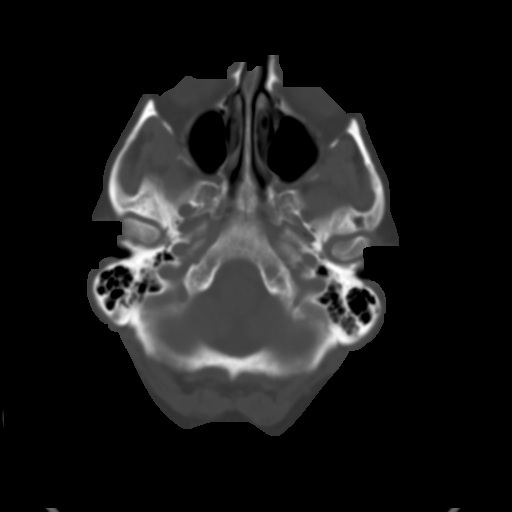
[im 8/30  brain]
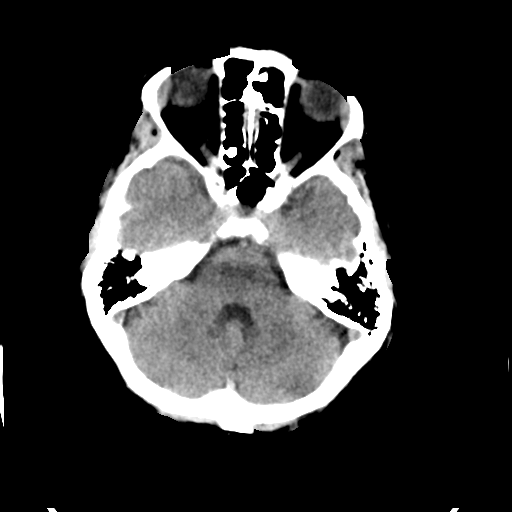
[im 11/30  brain]
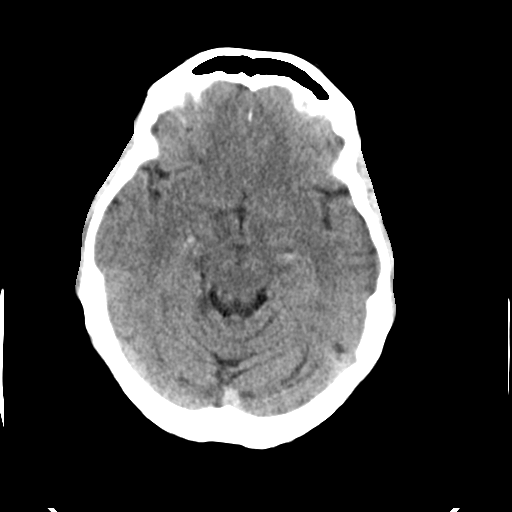
[im 15/30  brain]
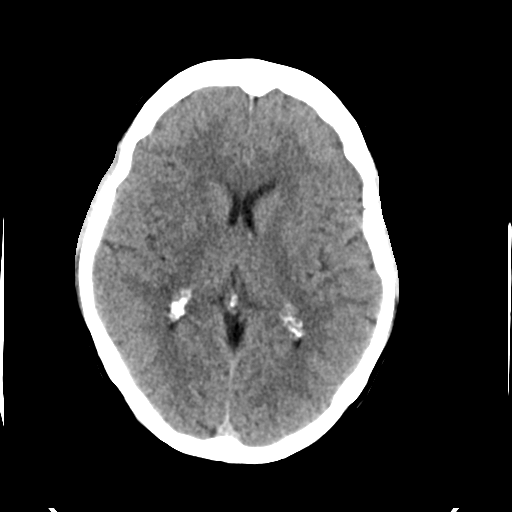
[im 19/30  brain]
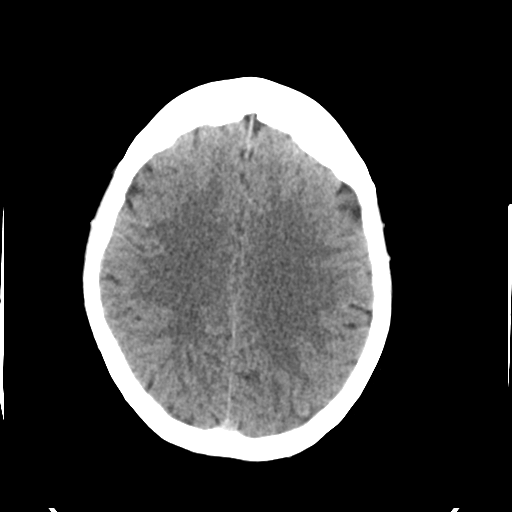
[im 19/30  bone]
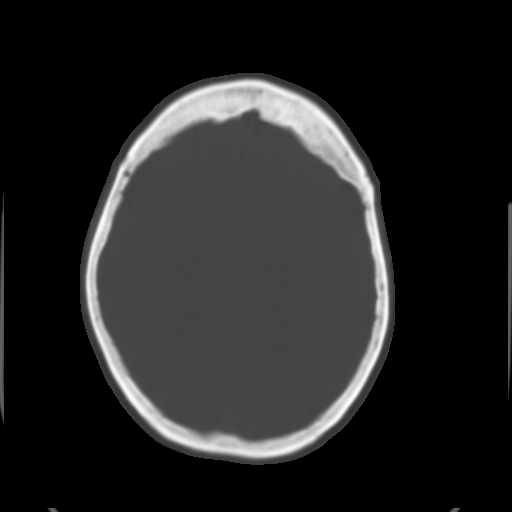
[im 22/30  brain]
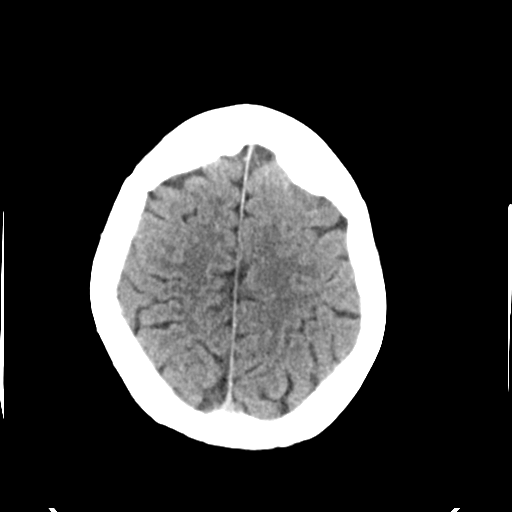
[im 26/30  brain]
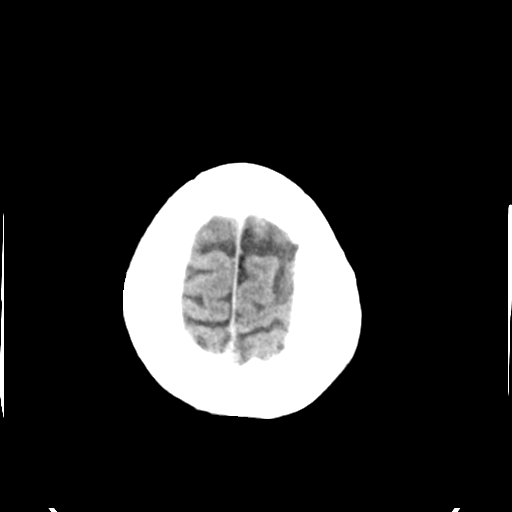

[Series 5: head without cor · coronal · non-contrast · 0.32mm/px · 3 of 62 slices shown]
[im 21/62  brain]
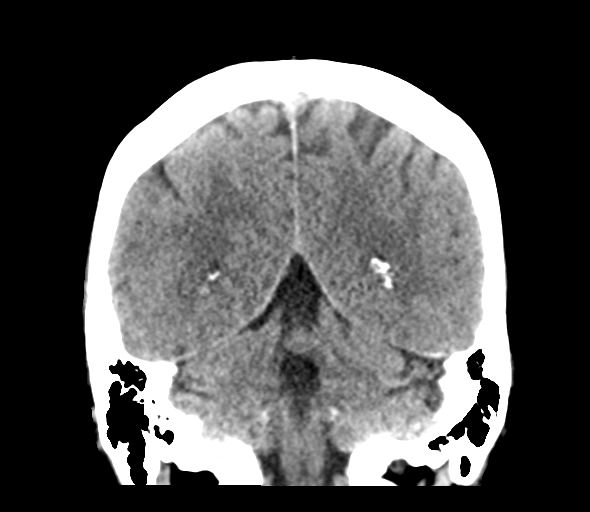
[im 28/62  brain]
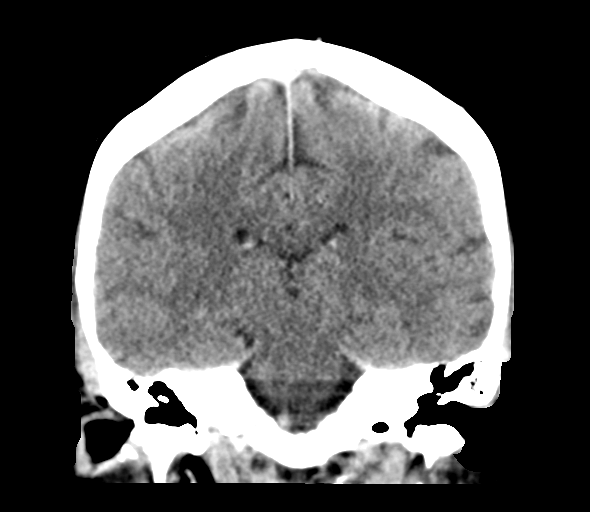
[im 34/62  brain]
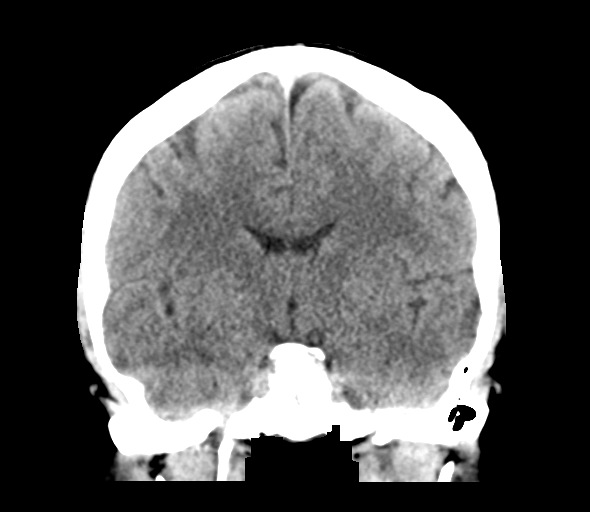

[Series 6: head without sag · sagittal · non-contrast · 0.31mm/px · 3 of 52 slices shown]
[im 18/52  brain]
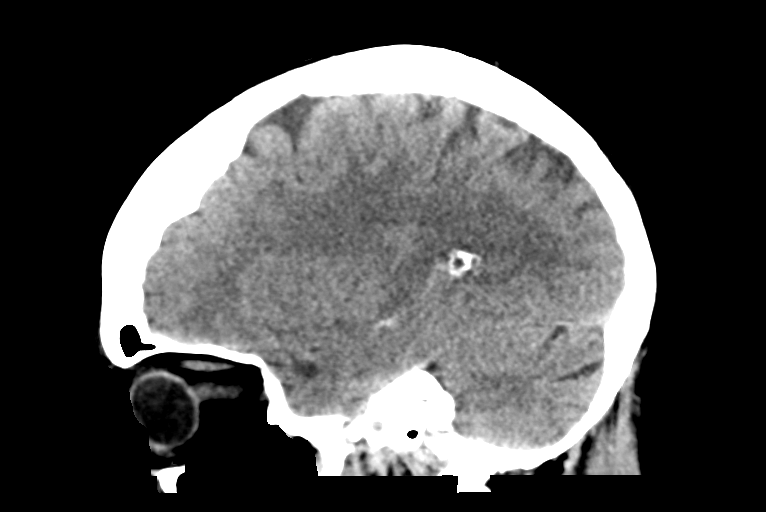
[im 26/52  brain]
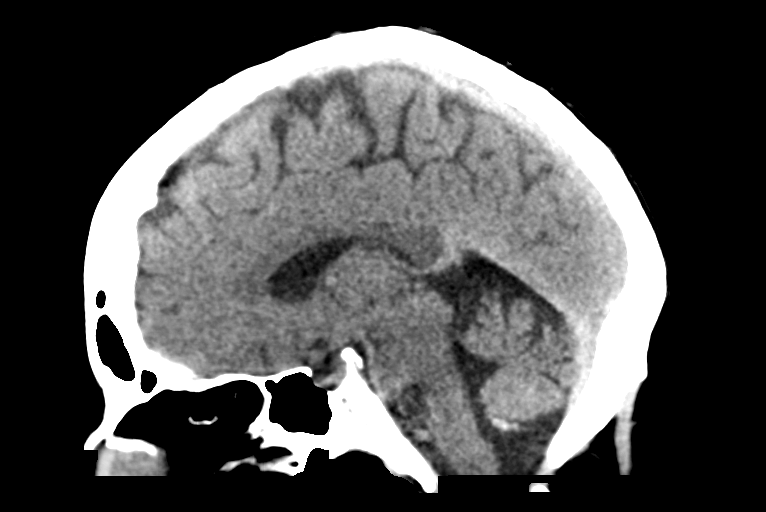
[im 35/52  brain]
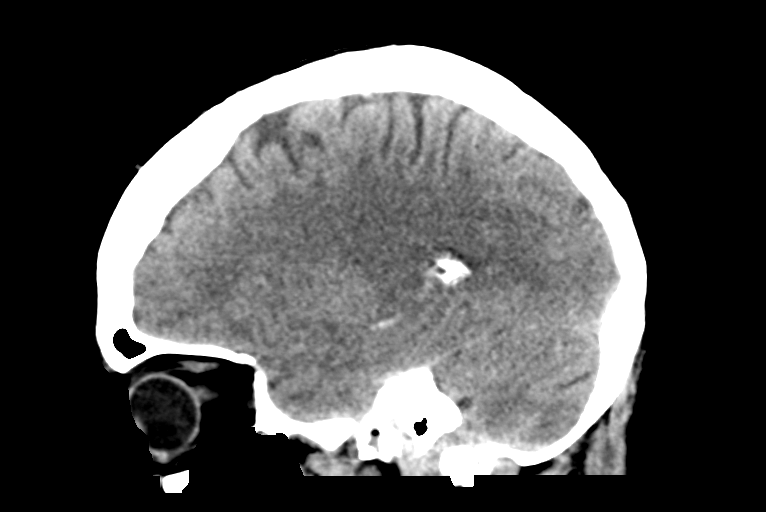

[15 of 47 positions shown; findings below may reference images not displayed]

FINDINGS: Brain:

Cerebral volume is within normal limits for age.

Bilateral basal ganglia mineralization.

There is no acute intracranial hemorrhage.

No demarcated cortical infarct.

No extra-axial fluid collection.

No evidence of intracranial mass.

No midline shift.

Vascular: No hyperdense vessel.  Atherosclerotic calcifications.

Skull: No calvarial fracture or focal suspicious osseous lesion.

Sinuses/Orbits: Visualized orbits show no acute finding.
Subcentimeter right ethmoid sinus osteoma. Trace mucosal thickening
within the left frontal ethmoidal recess.
IMPRESSION: Unremarkable CT appearance of the brain for age. No evidence of
acute intracranial abnormality.

## 2022-05-12 DIAGNOSIS — Z23 Encounter for immunization: Secondary | ICD-10-CM | POA: Diagnosis not present

## 2022-05-13 ENCOUNTER — Encounter: Payer: Medicare PPO | Admitting: Physical Therapy

## 2022-05-27 ENCOUNTER — Encounter: Payer: Medicare PPO | Admitting: Physical Therapy

## 2022-06-12 DIAGNOSIS — Z23 Encounter for immunization: Secondary | ICD-10-CM | POA: Diagnosis not present

## 2022-07-01 ENCOUNTER — Other Ambulatory Visit: Payer: Self-pay | Admitting: Family Medicine

## 2022-07-07 ENCOUNTER — Ambulatory Visit (INDEPENDENT_AMBULATORY_CARE_PROVIDER_SITE_OTHER): Payer: Medicare PPO

## 2022-07-07 VITALS — Wt 166.0 lb

## 2022-07-07 DIAGNOSIS — Z0000 Encounter for general adult medical examination without abnormal findings: Secondary | ICD-10-CM | POA: Diagnosis not present

## 2022-07-07 NOTE — Progress Notes (Signed)
I connected with  Christine Livingston on 07/07/22 by a audio enabled telemedicine application and verified that I am speaking with the correct person using two identifiers.  Patient Location: Home  Provider Location: Office/Clinic  I discussed the limitations of evaluation and management by telemedicine. The patient expressed understanding and agreed to proceed.   Subjective:   Christine Livingston is a 71 y.o. female who presents for Medicare Annual (Subsequent) preventive examination.  Review of Systems     Cardiac Risk Factors include: advanced age (>18mn, >>64women);dyslipidemia;hypertension     Objective:    Today's Vitals   07/07/22 1304  Weight: 166 lb (75.3 kg)   Body mass index is 26 kg/m.     07/07/2022    1:14 PM 05/08/2022    2:09 PM 06/24/2021    1:08 PM 01/17/2021    9:58 AM 06/23/2019    8:37 AM 05/11/2018   12:41 PM 05/05/2018   12:13 PM  Advanced Directives  Does Patient Have a Medical Advance Directive? Yes No Yes Yes Yes No No  Type of AParamed[place]of [place]  Living will [place] will Living will;Healthcare Power of Attorney    Does patient want to make changes to medical advance directive?     No - Patient declined    Copy of [place] Chart? No - copy requested    No - copy requested    Would patient like information on creating a medical advance directive?  No - Patient declined    No - Patient declined Yes (MAU/Ambulatory/Procedural Areas - Information given)    Current Medications (verified) Outpatient Encounter Medications as of 07/07/2022  Medication Sig   amLODipine (NORVASC) 5 MG tablet Take 1 tablet by mouth once daily   atorvastatin (LIPITOR) 40 MG tablet Take 1 tablet by mouth once daily   Cholecalciferol (MAXIMUM D3 PO) Take by mouth. Once a week   lisinopril (ZESTRIL) 40 MG tablet Take 1 tablet by mouth once daily   Wheat Dextrin (BENEFIBER PO) Take by mouth  daily.   Probiotic Product (PROBIOTIC ADVANCED) CAPS Take by mouth. (Patient not taking: Reported on 07/07/2022)   [DISCONTINUED] Calcium-Phosphorus-Vitamin D (CITRACAL +D3 PO) Take by mouth.   [DISCONTINUED] cyclobenzaprine (FLEXERIL) 5 MG tablet Take 1-2 tablets (5-10 mg total) by mouth at bedtime as needed for muscle spasms.   No facility-administered encounter medications on file as of 07/07/2022.    Allergies (verified) Bactrim [sulfamethoxazole-trimethoprim], Cephalexin, and Nitrofurantoin   History: Past Medical History:  Diagnosis Date   Constipation    Hemorrhoid    banding   Hypertension    SVD (spontaneous vaginal delivery)    x 1   WPW (Wolff-Parkinson-White syndrome)    occasional fast heart beat, pt coughs and goes back to normal, no meds   Past Surgical History:  Procedure Laterality Date   ANTERIOR AND POSTERIOR REPAIR N/A 05/11/2018   Procedure: ANTERIOR (CYSTOCELE) REPAIR;  Surgeon: LTyson Dense MD;  [facility];  Service: Gynecology;  Laterality: N/A;   COLONOSCOPY     polyps   CYSTOSCOPY  05/11/2018   Procedure: CYSTOSCOPY;  Surgeon: LTyson Dense MD;  [facility];  Service: Gynecology;;   CYSTOSCOPY N/A 05/11/2018   Procedure: CConsuela Mimes  Surgeon: MBjorn Loser MD;  [facility];  Service: Urology;  Laterality: N/A;   laparoscopic drainage of ovarian cyst  1994   LAPAROSCOPIC VAGINAL HYSTERECTOMY WITH SALPINGO OOPHORECTOMY Bilateral 05/11/2018  Procedure: LAPAROSCOPIC ASSISTED VAGINAL HYSTERECTOMY WITH SALPINGO OOPHORECTOMY, MCCALL'S CULDOPLASTY;  Surgeon: Tyson Dense, MD;  Location: [facility];  Service: Gynecology;  Laterality: Bilateral;   PUBOVAGINAL SLING N/A 05/11/2018   Procedure: Gaynelle Arabian;  Surgeon: Bjorn Loser, MD;  Location: [facility];  Service: Urology;  Laterality: N/A;   Family History  Problem Relation Age of Onset   Heart disease Mother    Heart failure Mother    Hypertension Father     Diabetes Sister    Hypertension Sister    Diabetes Paternal Grandmother    Breast cancer Paternal Aunt    Pancreatic cancer Paternal Uncle    Social History   Socioeconomic History   Marital status: Married    Spouse name: Not on file   Number of children: 1   Years of education: Not on file   Highest education level: Not on file  Occupational History   Occupation: Retired     Comment: Pharmacist, hospital   Tobacco Use   Smoking status: Former    Packs/day: 1.00    Types: Cigarettes    Quit date: 10/28/1980    Years since quitting: 41.7   Smokeless tobacco: Never  Vaping Use   Vaping Use: Never used  Substance and Sexual Activity   Alcohol use: Yes    Alcohol/week: 0.0 standard drinks of alcohol    Comment: occasional wine couple times a month   Drug use: Never   Sexual activity: Not on file  Other Topics Concern   Not on file  Social History Narrative   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   Social Determinants of Health   Financial Resource Strain: Low Risk  (07/07/2022)   Overall Financial Resource Strain (CARDIA)    Difficulty of Paying Living Expenses: Not hard at all  Food Insecurity: No Food Insecurity (07/07/2022)   Hunger Vital Sign    Worried About Running Out of Food in the Last Year: Never true    Ran Out of Food in the Last Year: Never true  Transportation Needs: No Transportation Needs (07/07/2022)   PRAPARE - Hydrologist (Medical): No    Lack of Transportation (Non-Medical): No  Physical Activity: Insufficiently Active (07/07/2022)   Exercise Vital Sign    Days of Exercise per Week: 3 days    Minutes of Exercise per Session: 30 min  Stress: No Stress Concern Present (07/07/2022)   [place]    Feeling of Stress : Only a little  Social Connections: Moderately Integrated (07/07/2022)   Social Connection and Isolation Panel [NHANES]    Frequency of Communication with Friends and  Family: Three times a week    Frequency of Social Gatherings with Friends and Family: Three times a week    Attends Religious Services: 1 to 4 times per year    Active Member of Clubs or Organizations: No    Attends Archivist Meetings: Never    Marital Status: Married    Tobacco Counseling Counseling given: Not Answered   Clinical Intake:  Pre-visit preparation completed: Yes  Pain : No/denies pain     BMI - recorded: 26 Nutritional Status: BMI 25 -29 Overweight Nutritional Risks: None Diabetes: No  How often do you need to have someone help you when you read instructions, pamphlets, or other written materials from your doctor or pharmacy?: 1 - Never  Diabetic?no  Interpreter Needed?: No  Information entered by :: Charlott Rakes, LPN  Activities of Daily Living    07/07/2022    1:14 PM  In your present state of health, do you have any difficulty performing the following activities:  Hearing? 1  Comment wears hearing aids  Vision? 0  Difficulty concentrating or making decisions? 0  Walking or climbing stairs? 0  Dressing or bathing? 0  Doing errands, shopping? 0  Preparing Food and eating ? N  Using the Toilet? N  In the past six months, have you accidently leaked urine? N  Do you have problems with loss of bowel control? N  Managing your Medications? N  Managing your Finances? N  Housekeeping or managing your Housekeeping? N    Patient Care Team: Vivi Barrack, MD as PCP - General (Family Medicine) Evans Lance, MD as Consulting Physician (Cardiology) Richmond Campbell, MD as Consulting Physician (Gastroenterology) Calvert Cantor, MD as Consulting Physician (Ophthalmology) Royston Sinner Colin Benton, MD as Consulting Physician (Obstetrics and Gynecology) [facility] [place] as Consulting Physician (Orthopedic Surgery) Posey Pronto, DDS as Consulting Physician (Dentistry) Bjorn Loser, MD as Consulting Physician  (Urology) [name], DO as Consulting Physician ([place]) Virgina Evener, OD as Consulting Physician (Optometry)  Indicate any recent Medical Services you may have received from other than [facility] providers in the past year (date may be approximate).     Assessment:   This is a routine wellness examination for Christine Livingston.  Hearing/Vision screen Hearing Screening - Comments:: Pt wears hearing issues  Vision Screening - Comments:: Pt follows up with Dr Katy Fitch for annul eye exams   Dietary issues and exercise activities discussed: Current Exercise Habits: Home exercise routine, Type of exercise: walking, Time (Minutes): 30, Frequency (Times/Week): 3, Weekly Exercise (Minutes/Week): 90   Goals Addressed             This Visit's Progress    Patient Stated       Exercising more        Depression Screen    07/07/2022    1:12 PM 04/22/2022   10:23 AM 07/01/2021    9:21 AM 06/24/2021    1:06 PM 02/05/2021   10:16 AM 06/25/2020    9:18 AM 06/23/2019    8:38 AM  PHQ 2/9 Scores  PHQ - 2 Score 0 0 0 0 0 0 0  PHQ- 9 Score       2    Fall Risk    07/07/2022    1:14 PM 04/22/2022   10:23 AM 07/01/2021    9:21 AM 06/24/2021    1:09 PM 02/05/2021   10:15 AM  [place] in the past year? 0 0 0 1 0  Number falls in past yr: 0 0 0 1   Injury with Fall? 0 0 0 1   Comment    bruised knee and elbow   Risk for fall due to : Impaired vision No Fall Risks  Impaired vision   Follow up Falls prevention discussed   Falls prevention discussed Falls evaluation completed    FALL RISK PREVENTION PERTAINING TO THE HOME:  Any stairs in or around the home? Yes  If so, are there any without handrails? No  Home free of loose throw rugs in walkways, pet beds, electrical cords, etc? Yes  Adequate lighting in your home to reduce risk of falls? Yes   ASSISTIVE DEVICES UTILIZED TO PREVENT FALLS:  Life alert? No  Use of a cane, walker or w/c? No  Grab bars in the bathroom? Yes  Shower chair or bench in shower? Yes  Elevated toilet seat or a handicapped toilet? No   TIMED UP AND GO:  Was the test performed? No .  Cognitive Function:        07/07/2022    1:15 PM 06/24/2021    1:13 PM  6CIT Screen  What Year? 0 points 0 points  What month? 0 points 0 points  What time? 0 points 0 points  Count back from 20 0 points 0 points  Months in reverse 0 points 0 points  Repeat phrase 0 points 2 points  Total Score 0 points 2 points    Immunizations Immunization History  Administered Date(s) Administered   Fluad Quad(high Dose 65+) 05/14/2021, 05/12/2022   Influenza Whole 06/04/2009   Influenza, High Dose Seasonal PF 05/12/2017, 06/18/2018, 04/22/2019, 06/20/2020   Influenza, Seasonal, Injecte, Preservative Fre 05/20/2015   Influenza-Unspecified 04/18/2014, 05/20/2015, 05/09/2016, 05/12/2017, 04/22/2019, 05/14/2021   Moderna Covid-19 Vaccine Bivalent Booster 79yr & up 06/12/2022   PFIZER(Purple Top)SARS-COV-2 Vaccination 09/08/2019, 10/03/2019, 05/23/2020   Pneumococcal-Unspecified 05/15/2016, 01/22/2017   Td 10/23/2008   Tdap 12/04/2020   Zoster Recombinat (Shingrix) 03/30/2018, 08/25/2018   Zoster, Live 11/19/2011    TDAP status: Up to date  Flu Vaccine status: Up to date  Pneumococcal vaccine status: Up to date  Covid-19 vaccine status: Completed vaccines  Qualifies for Shingles Vaccine? Yes   Zostavax completed Yes   Shingrix Completed?: Yes  Screening Tests Health Maintenance  Topic Date Due   MAMMOGRAM  11/19/2022   Medicare Annual Wellness (AWV)  07/08/2023   COLONOSCOPY (Pts 45-471yrInsurance coverage will need to be confirmed)  12/11/2025   DTaP/Tdap/Td (3 - Td or Tdap) 12/05/2030   INFLUENZA VACCINE  Completed   DEXA SCAN  Completed   Hepatitis C Screening  Completed   Zoster Vaccines- Shingrix  Completed   HPV VACCINES  Aged Out   Pneumonia Vaccine 6567Years old  Discontinued   COVID-19 Vaccine  Discontinued    Health  Maintenance  There are no preventive care reminders to display for this patient.   Colorectal cancer screening: Type of screening: Colonoscopy. Completed 12/11/20. Repeat every 5 years  Mammogram status: Completed 11/18/21. Repeat every year  Bone density 02/02/19  Additional Screening:  Hepatitis C Screening: Completed 06/23/19  Vision Screening: Recommended annual ophthalmology exams for early detection of glaucoma and other disorders of the eye. Is the patient up to date with their annual eye exam?  Yes  Who is the provider or what is the name of the office in which the patient attends annual eye exams? Dr GrKaty FitchIf pt is not established with a provider, would they like to be referred to a provider to establish care? No .   Dental Screening: Recommended annual dental exams for proper oral hygiene  Community Resource Referral / Chronic Care Management: CRR required this visit?  No   CCM required this visit?  No      Plan:     I have personally reviewed and noted the following in the patient's chart:   Medical and social history Use of alcohol, tobacco or illicit drugs  Current medications and supplements including opioid prescriptions. Patient is not currently taking opioid prescriptions. Functional ability and status Nutritional status Physical activity Advanced directives List of other physicians Hospitalizations, surgeries, and ER visits in previous 12 months Vitals Screenings to include cognitive, depression, and falls Referrals and appointments  In addition, I have reviewed and discussed with patient certain preventive protocols,  quality metrics, and best practice recommendations. A written personalized care plan for preventive services as well as general preventive health recommendations were provided to patient.     Willette Brace, LPN   [id number]   Nurse Notes: none

## 2022-07-10 ENCOUNTER — Ambulatory Visit (INDEPENDENT_AMBULATORY_CARE_PROVIDER_SITE_OTHER): Payer: Medicare PPO | Admitting: Family Medicine

## 2022-07-10 ENCOUNTER — Encounter: Payer: Self-pay | Admitting: Family Medicine

## 2022-07-10 VITALS — BP 132/82 | HR 84 | Temp 97.7°F | Ht 67.0 in | Wt 170.6 lb

## 2022-07-10 DIAGNOSIS — I10 Essential (primary) hypertension: Secondary | ICD-10-CM

## 2022-07-10 DIAGNOSIS — E785 Hyperlipidemia, unspecified: Secondary | ICD-10-CM

## 2022-07-10 DIAGNOSIS — Z0001 Encounter for general adult medical examination with abnormal findings: Secondary | ICD-10-CM

## 2022-07-10 DIAGNOSIS — L608 Other nail disorders: Secondary | ICD-10-CM | POA: Diagnosis not present

## 2022-07-10 DIAGNOSIS — M546 Pain in thoracic spine: Secondary | ICD-10-CM

## 2022-07-10 DIAGNOSIS — E2839 Other primary ovarian failure: Secondary | ICD-10-CM | POA: Diagnosis not present

## 2022-07-10 DIAGNOSIS — R739 Hyperglycemia, unspecified: Secondary | ICD-10-CM

## 2022-07-10 LAB — LIPID PANEL
Cholesterol: 145 mg/dL (ref 0–200)
HDL: 57.7 mg/dL (ref >39.00)
LDL Cholesterol: 68 mg/dL (ref 0–99)
NonHDL: 86.89
Total CHOL/HDL Ratio: 3
Triglycerides: 96 mg/dL (ref 0.0–149.0)
VLDL: 19.2 mg/dL (ref 0.0–40.0)

## 2022-07-10 LAB — COMPREHENSIVE METABOLIC PANEL
ALT: 35 U/L (ref 0–35)
AST: 25 U/L (ref 0–37)
Albumin: 4.4 g/dL (ref 3.5–5.2)
Alkaline Phosphatase: 72 U/L (ref 39–117)
BUN: 13 mg/dL (ref 6–23)
CO2: 31 mEq/L (ref 19–32)
Calcium: 9.3 mg/dL (ref 8.4–10.5)
Chloride: 103 mEq/L (ref 96–112)
Creatinine, Ser: 0.83 mg/dL (ref 0.40–1.20)
GFR: 70.98 mL/min (ref >60.00)
Glucose, Bld: 92 mg/dL (ref 70–99)
Potassium: 3.8 mEq/L (ref 3.5–5.1)
Sodium: 141 mEq/L (ref 135–145)
Total Bilirubin: 0.6 mg/dL (ref 0.2–1.2)
Total Protein: 6.6 g/dL (ref 6.0–8.3)

## 2022-07-10 LAB — CBC
HCT: 42 % (ref 36.0–46.0)
Hemoglobin: 14.3 g/dL (ref 12.0–15.0)
MCHC: 34 g/dL (ref 30.0–36.0)
MCV: 87.3 fl (ref 78.0–100.0)
Platelets: 219 10*3/uL (ref 150.0–400.0)
RBC: 4.82 Mil/uL (ref 3.87–5.11)
RDW: 12.7 % (ref 11.5–15.5)
WBC: 4.1 10*3/uL (ref 4.0–10.5)

## 2022-07-10 LAB — IBC + FERRITIN
Ferritin: 108.2 ng/mL (ref 10.0–291.0)
Iron: 125 ug/dL (ref 42–145)
Saturation Ratios: 39.3 % (ref 20.0–50.0)
TIBC: 317.8 ug/dL (ref 250.0–450.0)
Transferrin: 227 mg/dL (ref 212.0–360.0)

## 2022-07-10 LAB — VITAMIN D 25 HYDROXY (VIT D DEFICIENCY, FRACTURES): VITD: 40.5 ng/mL (ref 30.00–100.00)

## 2022-07-10 LAB — HEMOGLOBIN A1C: Hgb A1c MFr Bld: 5.8 % (ref 4.6–6.5)

## 2022-07-10 LAB — TSH: TSH: 1.57 u[IU]/mL (ref 0.35–5.50)

## 2022-07-10 LAB — VITAMIN B12: Vitamin B-12: 350 pg/mL (ref 211–911)

## 2022-07-10 NOTE — Progress Notes (Signed)
Chief Complaint:  Christine Livingston is a 71 y.o. female who presents today for her annual comprehensive physical exam.    Assessment/Plan:  New/Acute Problems: Nail Changes No red flag signs or symptoms.  She does have some slight clubbing and ridges in a few of her nails on her hands.  This may be age-related however check labs today including CBC, c-Met, TSH, B12, iron panel, and vitamin D.  Chronic Problems Addressed Today: Essential hypertension At goal on amlodipine 5 mg daily and lisinopril 40 mg daily.  Hyperglycemia Check A1c.  Discussed lifestyle modifications.  Dyslipidemia Check lipids.  She is on Lipitor 40 mg daily.  Tolerating well.  Thoracic back pain Symptoms resolved since last visit.  She will continue home exercise program as needed.  Preventative Healthcare: Up-to-date on vaccines and cancer screening.  Will check DEXA scan soon.  Check labs today.  Patient Counseling(The following topics were reviewed and/or handout was given):  -Nutrition: Stressed importance of moderation in sodium/caffeine intake, saturated fat and cholesterol, caloric balance, sufficient intake of fresh fruits, vegetables, and fiber.  -Stressed the importance of regular exercise.   -Substance Abuse: Discussed cessation/primary prevention of tobacco, alcohol, or other drug use; driving or other dangerous activities under the influence; availability of treatment for abuse.   -Injury prevention: Discussed safety belts, safety helmets, smoke detector, smoking near bedding or upholstery.   -Sexuality: Discussed sexually transmitted diseases, partner selection, use of condoms, avoidance of unintended pregnancy and contraceptive alternatives.   -Dental health: Discussed importance of regular tooth brushing, flossing, and dental visits.  -Health maintenance and immunizations reviewed. Please refer to Health maintenance section.  Return to care in 1 year for next preventative visit.      Subjective:  HPI:  She has no acute complaints today.   She has noticed that her nails have been more brittle and her hair has been thinning.  She would like to have blood recheck for any deficiency.  Lifestyle Diet: Balanced. Plenty of fruits and vegetables.  Exercise: Likes to walk.      07/10/2022    7:57 AM  Depression screen PHQ 2/9  Decreased Interest 0  Down, Depressed, Hopeless 0  PHQ - 2 Score 0    There are no preventive care reminders to display for this patient.   ROS: Per HPI, otherwise a complete review of systems was negative.   PMH:  The following were reviewed and entered/updated in epic: Past Medical History:  Diagnosis Date   Constipation    Hemorrhoid    banding   Hypertension    SVD (spontaneous vaginal delivery)    x 1   WPW (Wolff-Parkinson-White syndrome)    occasional fast heart beat, pt coughs and goes back to normal, no meds   Patient Active Problem List   Diagnosis Date Noted   Insomnia 07/01/2021   Diverticulosis 03/26/2021   Stress 12/04/2020   Hyperglycemia 06/26/2020   Paroxysmal SVT (supraventricular tachycardia) 06/25/2020   Dyslipidemia 06/27/2019   Thoracic back pain 06/21/2018   Post-menopause 06/21/2018   S/P hysterectomy with oophorectomy 05/11/2018   Prolapsed uterus 08/25/2017   External hemorrhoids 01/07/2017   Wolff-Parkinson-White (WPW) syndrome 01/07/2017   History of colonic polyps 01/30/2015   Essential hypertension 02/01/2007   Past Surgical History:  Procedure Laterality Date   ANTERIOR AND POSTERIOR REPAIR N/A 05/11/2018   Procedure: ANTERIOR (CYSTOCELE) REPAIR;  Surgeon: Tyson Dense, MD;  Location: [facility];  Service: Gynecology;  Laterality: N/A;   COLONOSCOPY  polyps   CYSTOSCOPY  05/11/2018   Procedure: CYSTOSCOPY;  Surgeon: Tyson Dense, MD;  Location: [facility];  Service: Gynecology;;   CYSTOSCOPY N/A 05/11/2018   Procedure: Consuela Mimes;  Surgeon: Bjorn Loser, MD;  Location: [facility];  Service: Urology;  Laterality: N/A;   laparoscopic drainage of ovarian cyst  1994   LAPAROSCOPIC VAGINAL HYSTERECTOMY WITH SALPINGO OOPHORECTOMY Bilateral 05/11/2018   Procedure: LAPAROSCOPIC ASSISTED VAGINAL HYSTERECTOMY WITH SALPINGO OOPHORECTOMY, MCCALL'S CULDOPLASTY;  Surgeon: Tyson Dense, MD;  Location: [facility];  Service: Gynecology;  Laterality: Bilateral;   PUBOVAGINAL SLING N/A 05/11/2018   Procedure: Gaynelle Arabian;  Surgeon: Bjorn Loser, MD;  Location: [facility];  Service: Urology;  Laterality: N/A;    Family History  Problem Relation Age of Onset   Heart disease Mother    Heart failure Mother    Hypertension Father    Diabetes Sister    Hypertension Sister    Diabetes Paternal Grandmother    Breast cancer Paternal Aunt    Pancreatic cancer Paternal Uncle     Medications- reviewed and updated Current Outpatient Medications  Medication Sig Dispense Refill   amLODipine (NORVASC) 5 MG tablet Take 1 tablet by mouth once daily 90 tablet 0   atorvastatin (LIPITOR) 40 MG tablet Take 1 tablet by mouth once daily 90 tablet 0   Cholecalciferol (MAXIMUM D3 PO) Take by mouth. Once a week     lisinopril (ZESTRIL) 40 MG tablet Take 1 tablet by mouth once daily 90 tablet 0   Probiotic Product (PROBIOTIC ADVANCED) CAPS Take by mouth.     Wheat Dextrin (BENEFIBER PO) Take by mouth daily.     No current facility-administered medications for this visit.    Allergies-reviewed and updated Allergies  Allergen Reactions   Bactrim [Sulfamethoxazole-Trimethoprim] Rash and Other (See Comments)    Leg cramp   Cephalexin Diarrhea   Nitrofurantoin Diarrhea    Social History   Socioeconomic History   Marital status: Married    Spouse name: Not on file   Number of children: 1   Years of education: Not on file   Highest education level: Not on file  Occupational History   Occupation: Retired     Comment: Pharmacist, hospital   Tobacco Use   Smoking status: Former    Packs/day:  1.00    Types: Cigarettes    Quit date: 10/28/1980    Years since quitting: 41.7   Smokeless tobacco: Never  Vaping Use   Vaping Use: Never used  Substance and Sexual Activity   Alcohol use: Yes    Alcohol/week: 0.0 standard drinks of alcohol    Comment: occasional wine couple times a month   Drug use: Never   Sexual activity: Not on file  Other Topics Concern   Not on file  Social History Narrative   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   Social Determinants of Health   Financial Resource Strain: Low Risk  (07/07/2022)   Overall Financial Resource Strain (CARDIA)    Difficulty of Paying Living Expenses: Not hard at all  Food Insecurity: No Food Insecurity (07/07/2022)   Hunger Vital Sign    Worried About Running Out of Food in the Last Year: Never true    Ran Out of Food in the Last Year: Never true  Transportation Needs: No Transportation Needs (07/07/2022)   PRAPARE - Hydrologist (Medical): No    Lack of Transportation (Non-Medical): No  Physical Activity: Insufficiently Active (07/07/2022)   Exercise  Vital Sign    Days of Exercise per Week: 3 days    Minutes of Exercise per Session: 30 min  Stress: No Stress Concern Present (07/07/2022)   [place]    Feeling of Stress : Only a little  Social Connections: Moderately Integrated (07/07/2022)   Social Connection and Isolation Panel [NHANES]    Frequency of Communication with Friends and Family: Three times a week    Frequency of Social Gatherings with Friends and Family: Three times a week    Attends Religious Services: 1 to 4 times per year    Active Member of Clubs or Organizations: No    Attends Music therapist: Never    Marital Status: Married        Objective:  Physical Exam: BP 132/82   Pulse 84   Temp 97.7 F (36.5 C) (Temporal)   Ht _0  (1.702 m)   Wt 170 lb 9.6 oz (77.4 kg)   LMP  (LMP Unknown)   SpO2  97%   BMI 26.72 kg/m   Body mass index is 26.72 kg/m. Wt Readings from Last 3 Encounters:  07/10/22 170 lb 9.6 oz (77.4 kg)  07/07/22 166 lb (75.3 kg)  04/22/22 166 lb 9.6 oz (75.6 kg)   Gen: NAD, resting comfortably HEENT: TMs normal bilaterally. OP clear. No thyromegaly noted.  CV: RRR with no murmurs appreciated Pulm: NWOB, CTAB with no crackles, wheezes, or rhonchi GI: Normal bowel sounds present. Soft, Nontender, Nondistended. MSK: no edema, cyanosis, or clubbing noted Skin: warm, dry Neuro: CN2-12 grossly intact. Strength 5/5 in upper and lower extremities. Reflexes symmetric and intact bilaterally.  Psych: Normal affect and thought content     Treydon Henricks M. Jerline Pain, MD 07/10/2022 8:23 AM

## 2022-07-10 NOTE — Assessment & Plan Note (Signed)
Symptoms resolved since last visit.  She will continue home exercise program as needed.

## 2022-07-10 NOTE — Patient Instructions (Signed)
It was very nice to see you today!  We will check blood work today.  You can continue with your vitamin D supplementation.  We will get your bone density scan next year when you get your mammogram.  Please continue to work on diet and exercise.  Will see back in a year for your next physical.  Please come back to see Christine Livingston sooner if needed.  Take care, Dr Jerline Pain  PLEASE NOTE:  If you had any lab tests please let Christine Livingston know if you have not heard back within a few days. You may see your results on mychart before we have a chance to review them but we will give you a call once they are reviewed by Christine Livingston. If we ordered any referrals today, please let Christine Livingston know if you have not heard from their office within the next week.   Please try these tips to maintain a healthy lifestyle:  Eat at least 3 REAL meals and 1-2 snacks per day.  Aim for no more than 5 hours between eating.  If you eat breakfast, please do so within one hour of getting up.   Each meal should contain half fruits/vegetables, one quarter protein, and one quarter carbs (no bigger than a computer mouse)  Cut down on sweet beverages. This includes juice, soda, and sweet tea.   Drink at least 1 glass of water with each meal and aim for at least 8 glasses per day  Exercise at least 150 minutes every week.    Preventive Care 42 Years and Older, Female Preventive care refers to lifestyle choices and visits with your health care provider that can promote health and wellness. Preventive care visits are also called wellness exams. What can I expect for my preventive care visit? Counseling Your health care provider may ask you questions about your: Medical history, including: Past medical problems. Family medical history. Pregnancy and menstrual history. History of falls. Current health, including: Memory and ability to understand (cognition). Emotional well-being. Home life and relationship well-being. Sexual activity and sexual  health. Lifestyle, including: Alcohol, nicotine or tobacco, and drug use. Access to firearms. Diet, exercise, and sleep habits. Work and work Statistician. Sunscreen use. Safety issues such as seatbelt and bike helmet use. Physical exam Your health care provider will check your: Height and weight. These may be used to calculate your BMI (body mass index). BMI is a measurement that tells if you are at a healthy weight. Waist circumference. This measures the distance around your waistline. This measurement also tells if you are at a healthy weight and may help predict your risk of certain diseases, such as type 2 diabetes and high blood pressure. Heart rate and blood pressure. Body temperature. Skin for abnormal spots. What immunizations do I need?  Vaccines are usually given at various ages, according to a schedule. Your health care provider will recommend vaccines for you based on your age, medical history, and lifestyle or other factors, such as travel or where you work. What tests do I need? Screening Your health care provider may recommend screening tests for certain conditions. This may include: Lipid and cholesterol levels. Hepatitis C test. Hepatitis B test. HIV (human immunodeficiency virus) test. STI (sexually transmitted infection) testing, if you are at risk. Lung cancer screening. Colorectal cancer screening. Diabetes screening. This is done by checking your blood sugar (glucose) after you have not eaten for a while (fasting). Mammogram. Talk with your health care provider about how often you should have regular mammograms.  BRCA-related cancer screening. This may be done if you have a family history of breast, ovarian, tubal, or peritoneal cancers. Bone density scan. This is done to screen for osteoporosis. Talk with your health care provider about your test results, treatment options, and if necessary, the need for more tests. Follow these instructions at home: Eating and  drinking  Eat a diet that includes fresh fruits and vegetables, whole grains, lean protein, and low-fat dairy products. Limit your intake of foods with high amounts of sugar, saturated fats, and salt. Take vitamin and mineral supplements as recommended by your health care provider. Do not drink alcohol if your health care provider tells you not to drink. If you drink alcohol: Limit how much you have to 0-1 drink a day. Know how much alcohol is in your drink. In the [place] one drink equals one 12 oz bottle of beer (355 mL), one 5 oz glass of wine (148 mL), or one 1 oz glass of hard liquor (44 mL). Lifestyle Brush your teeth every morning and night with fluoride toothpaste. Floss one time each day. Exercise for at least 30 minutes 5 or more days each week. Do not use any products that contain nicotine or tobacco. These products include cigarettes, chewing tobacco, and vaping devices, such as e-cigarettes. If you need help quitting, ask your health care provider. Do not use drugs. If you are sexually active, practice safe sex. Use a condom or other form of protection in order to prevent STIs. Take aspirin only as told by your health care provider. Make sure that you understand how much to take and what form to take. Work with your health care provider to find out whether it is safe and beneficial for you to take aspirin daily. Ask your health care provider if you need to take a cholesterol-lowering medicine (statin). Find healthy ways to manage stress, such as: Meditation, yoga, or listening to music. Journaling. Talking to a trusted person. Spending time with friends and family. Minimize exposure to UV radiation to reduce your risk of skin cancer. Safety Always wear your seat belt while driving or riding in a vehicle. Do not drive: If you have been drinking alcohol. Do not ride with someone who has been drinking. When you are tired or distracted. While texting. If you have been using any  mind-altering substances or drugs. Wear a helmet and other protective equipment during sports activities. If you have firearms in your house, make sure you follow all gun safety procedures. What's next? Visit your health care provider once a year for an annual wellness visit. Ask your health care provider how often you should have your eyes and teeth checked. Stay up to date on all vaccines. This information is not intended to replace advice given to you by your health care provider. Make sure you discuss any questions you have with your health care provider. Document Revised: 01/16/2021 Document Reviewed: 01/16/2021 Elsevier Patient Education  [employer].

## 2022-07-10 NOTE — Assessment & Plan Note (Signed)
At goal on amlodipine 5 mg daily and lisinopril 40 mg daily.

## 2022-07-10 NOTE — Assessment & Plan Note (Signed)
Check A1c.  Discussed lifestyle modifications. °

## 2022-07-10 NOTE — Assessment & Plan Note (Signed)
Check lipids.  She is on Lipitor 40 mg daily.  Tolerating well.

## 2022-07-14 NOTE — Progress Notes (Signed)
Please inform patient of the following:  All of her labs are stable including vitamin D, B12, and thyroid.  Iron levels are normal also.  No obvious explanation for her canal or hair changes.  Could be age-related.  We can refer her to dermatology for further evaluation if she wishes.  Everything else is stable.  Do not need to make any changes to her treatment plan at this time.  She should continue to work on exercise and we can recheck in a year.

## 2022-08-05 DIAGNOSIS — H2513 Age-related nuclear cataract, bilateral: Secondary | ICD-10-CM | POA: Diagnosis not present

## 2022-08-07 ENCOUNTER — Other Ambulatory Visit: Payer: Self-pay | Admitting: Family Medicine

## 2022-08-12 ENCOUNTER — Other Ambulatory Visit: Payer: Self-pay | Admitting: Family Medicine

## 2022-08-19 DIAGNOSIS — D224 Melanocytic nevi of scalp and neck: Secondary | ICD-10-CM | POA: Diagnosis not present

## 2022-08-19 DIAGNOSIS — L820 Inflamed seborrheic keratosis: Secondary | ICD-10-CM | POA: Diagnosis not present

## 2022-08-19 DIAGNOSIS — L821 Other seborrheic keratosis: Secondary | ICD-10-CM | POA: Diagnosis not present

## 2022-08-19 DIAGNOSIS — D485 Neoplasm of uncertain behavior of skin: Secondary | ICD-10-CM | POA: Diagnosis not present

## 2022-08-19 DIAGNOSIS — D225 Melanocytic nevi of trunk: Secondary | ICD-10-CM | POA: Diagnosis not present

## 2022-08-19 DIAGNOSIS — D229 Melanocytic nevi, unspecified: Secondary | ICD-10-CM | POA: Diagnosis not present

## 2022-09-15 ENCOUNTER — Encounter: Payer: Self-pay | Admitting: Family Medicine

## 2022-09-15 ENCOUNTER — Ambulatory Visit: Payer: Medicare PPO | Admitting: Family Medicine

## 2022-09-15 VITALS — BP 120/60 | HR 90 | Temp 97.5°F | Ht 67.0 in | Wt 169.0 lb

## 2022-09-15 DIAGNOSIS — J069 Acute upper respiratory infection, unspecified: Secondary | ICD-10-CM | POA: Diagnosis not present

## 2022-09-15 NOTE — Progress Notes (Signed)
Subjective:     Patient ID: Christine Livingston, female    DOB: 06/18/1951, 72 y.o.   MRN: [id number]  Chief Complaint  Patient presents with   Cough    Sx started 5 days ago, dry cough Negative Covid test Friday, 09/12/22    Nasal Congestion    HPI-Dad deceased last yr at 106.   5 days congestion, cough-very dry-keeping awake-now looser..  Neg covid on 2/9.  No f/c.  No sob. No h/o asthma.  No HA.    There are no preventive care reminders to display for this patient.  Past Medical History:  Diagnosis Date   Constipation    Hemorrhoid    banding   Hypertension    SVD (spontaneous vaginal delivery)    x 1   WPW (Wolff-Parkinson-White syndrome)    occasional fast heart beat, pt coughs and goes back to normal, no meds    Past Surgical History:  Procedure Laterality Date   ANTERIOR AND POSTERIOR REPAIR N/A 05/11/2018   Procedure: ANTERIOR (CYSTOCELE) REPAIR;  Surgeon: Tyson Dense, MD;  Location: [facility];  Service: Gynecology;  Laterality: N/A;   COLONOSCOPY     polyps   CYSTOSCOPY  05/11/2018   Procedure: CYSTOSCOPY;  Surgeon: Tyson Dense, MD;  Location: [facility];  Service: Gynecology;;   CYSTOSCOPY N/A 05/11/2018   Procedure: Consuela Mimes;  Surgeon: Bjorn Loser, MD;  Location: [facility];  Service: Urology;  Laterality: N/A;   laparoscopic drainage of ovarian cyst  1994   LAPAROSCOPIC VAGINAL HYSTERECTOMY WITH SALPINGO OOPHORECTOMY Bilateral 05/11/2018   Procedure: LAPAROSCOPIC ASSISTED VAGINAL HYSTERECTOMY WITH SALPINGO OOPHORECTOMY, MCCALL'S CULDOPLASTY;  Surgeon: Tyson Dense, MD;  Location: [facility];  Service: Gynecology;  Laterality: Bilateral;   PUBOVAGINAL SLING N/A 05/11/2018   Procedure: Gaynelle Arabian;  Surgeon: Bjorn Loser, MD;  Location: [facility];  Service: Urology;  Laterality: N/A;    Outpatient Medications Prior to Visit  Medication Sig Dispense Refill   amLODipine (NORVASC) 5 MG tablet Take 1 tablet by mouth once daily 90 tablet  0   atorvastatin (LIPITOR) 40 MG tablet Take 1 tablet by mouth once daily 90 tablet 0   Cholecalciferol (MAXIMUM D3 PO) Take by mouth. Once a week     lisinopril (ZESTRIL) 40 MG tablet Take 1 tablet by mouth once daily 90 tablet 0   Probiotic Product (PROBIOTIC ADVANCED) CAPS Take by mouth.     Wheat Dextrin (BENEFIBER PO) Take by mouth daily.     No facility-administered medications prior to visit.    Allergies  Allergen Reactions   Bactrim [Sulfamethoxazole-Trimethoprim] Rash and Other (See Comments)    Leg cramp   Cephalexin Diarrhea   Nitrofurantoin Diarrhea   ROS neg/noncontributory except as noted HPI/below      Objective:     BP 120/60   Pulse 90   Temp (!) 97.5 F (36.4 C) (Temporal)   Ht 5' 7"$  (1.702 m)   Wt 169 lb (76.7 kg)   LMP  (LMP Unknown)   SpO2 96%   BMI 26.47 kg/m  Wt Readings from Last 3 Encounters:  09/15/22 169 lb (76.7 kg)  07/10/22 170 lb 9.6 oz (77.4 kg)  07/07/22 166 lb (75.3 kg)    Physical Exam   Gen: WDWN NAD HEENT: NCAT, conjunctiva not injected, sclera nonicteric TM WNL B, OP moist, no exudates   slightly congested NECK:  supple, no thyromegaly, few small submand nodes CARDIAC: RRR, S1S2+ LUNGS: CTAB. No wheezes MSK: no gross abnormalities.  NEURO: A&O x3.  CN II-XII intact.  PSYCH: normal mood. Good eye contact     Assessment & Plan:   Problem List Items Addressed This Visit   None Visit Diagnoses     Viral upper respiratory tract infection    -  Primary      Viral uri-symptomatic tx.  If worse, new symptoms, not resolving, let us know.   No orders of the defined types were placed in this encounter.   Wellington Hampshire, MD

## 2022-09-15 NOTE — Patient Instructions (Signed)
If worse, new symptoms, not resolving, let us know.

## 2022-09-24 ENCOUNTER — Other Ambulatory Visit: Payer: Self-pay | Admitting: Family Medicine

## 2022-09-30 ENCOUNTER — Other Ambulatory Visit: Payer: Self-pay | Admitting: Family Medicine

## 2022-10-27 ENCOUNTER — Telehealth: Payer: Self-pay | Admitting: Family Medicine

## 2022-10-27 NOTE — Telephone Encounter (Signed)
Patient states she's been taking maximum D3 daily and stopped calcium. Noticed finger nails were brittle so she began taking citracal calcium pills that also have vit d in it.   Patient wants to know if this would cause a bad interaction or overflow of vitamin D. Please Advise.

## 2022-10-28 NOTE — Telephone Encounter (Signed)
Please advise 

## 2022-10-28 NOTE — Telephone Encounter (Signed)
It is fine for her to take that combination. We can recheck vitamin D here in 3-6 months.  Algis Greenhouse. Jerline Pain, MD 10/28/2022 7:57 PM

## 2022-10-29 NOTE — Telephone Encounter (Signed)
Mychart message sent to patient with PCP recommendations  

## 2022-11-04 ENCOUNTER — Other Ambulatory Visit: Payer: Self-pay | Admitting: Family Medicine

## 2022-11-06 ENCOUNTER — Other Ambulatory Visit: Payer: Self-pay | Admitting: Family Medicine

## 2022-11-24 DIAGNOSIS — Z780 Asymptomatic menopausal state: Secondary | ICD-10-CM | POA: Diagnosis not present

## 2022-11-24 DIAGNOSIS — Z1231 Encounter for screening mammogram for malignant neoplasm of breast: Secondary | ICD-10-CM | POA: Diagnosis not present

## 2022-11-24 LAB — HM DEXA SCAN: HM Dexa Scan: NORMAL

## 2022-11-24 LAB — HM MAMMOGRAPHY

## 2022-12-15 ENCOUNTER — Ambulatory Visit: Payer: Medicare PPO | Admitting: Internal Medicine

## 2022-12-15 ENCOUNTER — Encounter: Payer: Self-pay | Admitting: Internal Medicine

## 2022-12-15 VITALS — BP 141/80 | HR 88 | Temp 98.3°F | Wt 172.3 lb

## 2022-12-15 DIAGNOSIS — R319 Hematuria, unspecified: Secondary | ICD-10-CM

## 2022-12-15 DIAGNOSIS — N3001 Acute cystitis with hematuria: Secondary | ICD-10-CM

## 2022-12-15 LAB — POC URINALSYSI DIPSTICK (AUTOMATED)
Bilirubin, UA: NEGATIVE
Glucose, UA: NEGATIVE
Ketones, UA: NEGATIVE
Nitrite, UA: POSITIVE
Protein, UA: NEGATIVE
Spec Grav, UA: 1.015 (ref 1.010–1.025)
Urobilinogen, UA: 0.2 E.U./dL
pH, UA: 7.5 (ref 5.0–8.0)

## 2022-12-15 MED ORDER — LEVOFLOXACIN 500 MG PO TABS
500.0000 mg | ORAL_TABLET | Freq: Every day | ORAL | 0 refills | Status: DC
Start: 2022-12-15 — End: 2022-12-17

## 2022-12-15 NOTE — Progress Notes (Signed)
Established Patient Office Visit     CC/Reason for Visit: "I think I have a UTI"  HPI: Christine Livingston is a 72 y.o. female who is coming in today for the above mentioned reasons.  For the past 4 days has been having dysuria, urinary frequency and low back pain.  She believes she has a UTI.   Past Medical/Surgical History: Past Medical History:  Diagnosis Date   Constipation    Hemorrhoid    banding   Hypertension    SVD (spontaneous vaginal delivery)    x 1   WPW (Wolff-Parkinson-White syndrome)    occasional fast heart beat, pt coughs and goes back to normal, no meds    Past Surgical History:  Procedure Laterality Date   ANTERIOR AND POSTERIOR REPAIR N/A 05/11/2018   Procedure: ANTERIOR (CYSTOCELE) REPAIR;  Surgeon: Ranae Pila, MD;  Location: [facility];  Service: Gynecology;  Laterality: N/A;   COLONOSCOPY     polyps   CYSTOSCOPY  05/11/2018   Procedure: CYSTOSCOPY;  Surgeon: Ranae Pila, MD;  Location: [facility];  Service: Gynecology;;   CYSTOSCOPY N/A 05/11/2018   Procedure: Bluford Kaufmann;  Surgeon: Alfredo Martinez, MD;  Location: [facility];  Service: Urology;  Laterality: N/A;   laparoscopic drainage of ovarian cyst  1994   LAPAROSCOPIC VAGINAL HYSTERECTOMY WITH SALPINGO OOPHORECTOMY Bilateral 05/11/2018   Procedure: LAPAROSCOPIC ASSISTED VAGINAL HYSTERECTOMY WITH SALPINGO OOPHORECTOMY, MCCALL'S CULDOPLASTY;  Surgeon: Ranae Pila, MD;  Location: [facility];  Service: Gynecology;  Laterality: Bilateral;   PUBOVAGINAL SLING N/A 05/11/2018   Procedure: Leonides Grills;  Surgeon: Alfredo Martinez, MD;  Location: [facility];  Service: Urology;  Laterality: N/A;    Social History:  reports that she quit smoking about 42 years ago. Her smoking use included cigarettes. She smoked an average of 1 pack per day. She has never used smokeless tobacco. She reports current alcohol use. She reports that she does not use drugs.  Allergies: Allergies  Allergen  Reactions   Bactrim [Sulfamethoxazole-Trimethoprim] Rash and Other (See Comments)    Leg cramp   Cephalexin Diarrhea   Nitrofurantoin Diarrhea    Family History:  Family History  Problem Relation Age of Onset   Heart disease Mother    Heart failure Mother    Hypertension Father    Diabetes Sister    Hypertension Sister    Diabetes Paternal Grandmother    Breast cancer Paternal Aunt    Pancreatic cancer Paternal Uncle      Current Outpatient Medications:    amLODipine (NORVASC) 5 MG tablet, Take 1 tablet by mouth once daily, Disp: 90 tablet, Rfl: 0   atorvastatin (LIPITOR) 40 MG tablet, Take 1 tablet by mouth once daily, Disp: 90 tablet, Rfl: 0   Cholecalciferol (MAXIMUM D3 PO), Take by mouth. Once a week, Disp: , Rfl:    Cholecalciferol (VITAMIN D3) 250 MCG (10000 UT) TABS, Take by mouth., Disp: , Rfl:    levofloxacin (LEVAQUIN) 500 MG tablet, Take 1 tablet (500 mg total) by mouth daily for 7 days., Disp: 7 tablet, Rfl: 0   lisinopril (ZESTRIL) 40 MG tablet, Take 1 tablet by mouth once daily, Disp: 90 tablet, Rfl: 0   Probiotic Product (PROBIOTIC ADVANCED) CAPS, Take by mouth., Disp: , Rfl:    Wheat Dextrin (BENEFIBER PO), Take by mouth daily., Disp: , Rfl:   Review of Systems:  Negative unless indicated in HPI.   Physical Exam: Vitals:   12/15/22 1447 12/15/22 1451  BP: [name])  140/90 (!) 141/80  Pulse: 88   Temp: 98.3 F (36.8 C)   TempSrc: Oral   SpO2: 97%   Weight: 172 lb 4.8 oz (78.2 kg)     Body mass index is 26.99 kg/m.   Physical Exam Vitals reviewed.  Constitutional:      Appearance: Normal appearance.  HENT:     Head: Normocephalic and atraumatic.  Eyes:     Conjunctiva/sclera: Conjunctivae normal.     Pupils: Pupils are equal, round, and reactive to light.  Skin:    General: Skin is warm and dry.  Neurological:     General: No focal deficit present.     Mental Status: She is alert and oriented to person, place, and time.  Psychiatric:         Mood and Affect: Mood normal.        Behavior: Behavior normal.        Thought Content: Thought content normal.        Judgment: Judgment normal.      Impression and Plan:  Acute cystitis with hematuria -     POCT Urinalysis Dipstick (Automated) -     Urinalysis; Future -     Urine Culture; Future -     levoFLOXacin; Take 1 tablet (500 mg total) by mouth daily for 7 days.  Dispense: 7 tablet; Refill: 0  Hematuria, unspecified type -     POCT Urinalysis Dipstick (Automated) -     Urinalysis; Future -     Urine Culture; Future   -In office urine dipstick with blood, leukocytes and nitrates.  She has allergies to Bactrim, Keflex and nitrofurantoin.  Treat with Levaquin x 7 days, sent for urine culture.  Time spent:23 minutes spent during today's visit reviewing chart, interviewing and examining patient and formulating plan of care.     Christine Jan, MD  Primary Care at [facility]

## 2022-12-16 ENCOUNTER — Ambulatory Visit: Payer: Medicare PPO | Admitting: Family Medicine

## 2022-12-16 DIAGNOSIS — R319 Hematuria, unspecified: Secondary | ICD-10-CM | POA: Diagnosis not present

## 2022-12-16 DIAGNOSIS — N3001 Acute cystitis with hematuria: Secondary | ICD-10-CM | POA: Diagnosis not present

## 2022-12-16 LAB — URINALYSIS, ROUTINE W REFLEX MICROSCOPIC
Bilirubin Urine: NEGATIVE
Ketones, ur: NEGATIVE
Nitrite: POSITIVE — AB
Specific Gravity, Urine: 1.015 (ref 1.000–1.030)
Total Protein, Urine: NEGATIVE
Urine Glucose: NEGATIVE
Urobilinogen, UA: 0.2 (ref 0.0–1.0)
pH: 7.5 (ref 5.0–8.0)

## 2022-12-17 ENCOUNTER — Encounter (HOSPITAL_BASED_OUTPATIENT_CLINIC_OR_DEPARTMENT_OTHER): Payer: Self-pay | Admitting: Emergency Medicine

## 2022-12-17 ENCOUNTER — Other Ambulatory Visit (HOSPITAL_BASED_OUTPATIENT_CLINIC_OR_DEPARTMENT_OTHER): Payer: Self-pay

## 2022-12-17 ENCOUNTER — Emergency Department (HOSPITAL_BASED_OUTPATIENT_CLINIC_OR_DEPARTMENT_OTHER): Payer: Medicare PPO

## 2022-12-17 ENCOUNTER — Other Ambulatory Visit: Payer: Self-pay

## 2022-12-17 ENCOUNTER — Telehealth: Payer: Self-pay | Admitting: Family Medicine

## 2022-12-17 ENCOUNTER — Emergency Department (HOSPITAL_BASED_OUTPATIENT_CLINIC_OR_DEPARTMENT_OTHER)
Admission: EM | Admit: 2022-12-17 | Discharge: 2022-12-17 | Disposition: A | Discharge status: Home / Self Care | Payer: Medicare PPO | Attending: Emergency Medicine | Admitting: Emergency Medicine

## 2022-12-17 DIAGNOSIS — R109 Unspecified abdominal pain: Secondary | ICD-10-CM

## 2022-12-17 DIAGNOSIS — Z79899 Other long term (current) drug therapy: Secondary | ICD-10-CM | POA: Diagnosis not present

## 2022-12-17 DIAGNOSIS — N3001 Acute cystitis with hematuria: Secondary | ICD-10-CM | POA: Insufficient documentation

## 2022-12-17 DIAGNOSIS — K5730 Diverticulosis of large intestine without perforation or abscess without bleeding: Secondary | ICD-10-CM | POA: Diagnosis not present

## 2022-12-17 DIAGNOSIS — I10 Essential (primary) hypertension: Secondary | ICD-10-CM | POA: Insufficient documentation

## 2022-12-17 LAB — URINALYSIS, ROUTINE W REFLEX MICROSCOPIC
Bilirubin Urine: NEGATIVE
Glucose, UA: NEGATIVE mg/dL
Ketones, ur: NEGATIVE mg/dL
Nitrite: NEGATIVE
Protein, ur: NEGATIVE mg/dL
Specific Gravity, Urine: 1.01 (ref 1.005–1.030)
pH: 5.5 (ref 5.0–8.0)

## 2022-12-17 LAB — COMPREHENSIVE METABOLIC PANEL
ALT: 24 U/L (ref 0–44)
AST: 24 U/L (ref 15–41)
Albumin: 4.9 g/dL (ref 3.5–5.0)
Alkaline Phosphatase: 83 U/L (ref 38–126)
Anion gap: 11 (ref 5–15)
BUN: 14 mg/dL (ref 8–23)
CO2: 26 mmol/L (ref 22–32)
Calcium: 10.3 mg/dL (ref 8.9–10.3)
Chloride: 100 mmol/L (ref 98–111)
Creatinine, Ser: 0.83 mg/dL (ref 0.44–1.00)
GFR, Estimated: >60 mL/min (ref >60)
Glucose, Bld: 84 mg/dL (ref 70–99)
Potassium: 3.6 mmol/L (ref 3.5–5.1)
Sodium: 137 mmol/L (ref 135–145)
Total Bilirubin: 1 mg/dL (ref 0.3–1.2)
Total Protein: 7.9 g/dL (ref 6.5–8.1)

## 2022-12-17 LAB — CBC WITH DIFFERENTIAL/PLATELET
Abs Immature Granulocytes: 0.02 10*3/uL (ref 0.00–0.07)
Basophils Absolute: 0 10*3/uL (ref 0.0–0.1)
Basophils Relative: 1 %
Eosinophils Absolute: 0.1 10*3/uL (ref 0.0–0.5)
Eosinophils Relative: 3 %
HCT: 47.4 % — ABNORMAL HIGH (ref 36.0–46.0)
Hemoglobin: 16.1 g/dL — ABNORMAL HIGH (ref 12.0–15.0)
Immature Granulocytes: 0 %
Lymphocytes Relative: 23 %
Lymphs Abs: 1.2 10*3/uL (ref 0.7–4.0)
MCH: 28.9 pg (ref 26.0–34.0)
MCHC: 34 g/dL (ref 30.0–36.0)
MCV: 85.1 fL (ref 80.0–100.0)
Monocytes Absolute: 0.5 10*3/uL (ref 0.1–1.0)
Monocytes Relative: 9 %
Neutro Abs: 3.3 10*3/uL (ref 1.7–7.7)
Neutrophils Relative %: 64 %
Platelets: 231 10*3/uL (ref 150–400)
RBC: 5.57 MIL/uL — ABNORMAL HIGH (ref 3.87–5.11)
RDW: 12.8 % (ref 11.5–15.5)
WBC: 5.2 10*3/uL (ref 4.0–10.5)
nRBC: 0 % (ref 0.0–0.2)

## 2022-12-17 LAB — URINE CULTURE: SPECIMEN QUALITY:: ADEQUATE

## 2022-12-17 LAB — LIPASE, BLOOD: Lipase: 45 U/L (ref 11–51)

## 2022-12-17 MED ORDER — CIPROFLOXACIN HCL 500 MG PO TABS
500.0000 mg | ORAL_TABLET | Freq: Two times a day (BID) | ORAL | 0 refills | Status: AC
  Filled 2022-12-17: qty 14, 7d supply, fill #0

## 2022-12-17 MED ORDER — METHOCARBAMOL 500 MG PO TABS
500.0000 mg | ORAL_TABLET | Freq: Two times a day (BID) | ORAL | 0 refills | Status: AC
  Filled 2022-12-17: qty 20, 10d supply, fill #0

## 2022-12-17 NOTE — Telephone Encounter (Signed)
Patient is aware 

## 2022-12-17 NOTE — Telephone Encounter (Signed)
Pt having reaction from levofloxacin (LEVAQUIN) 500 MG tablet muscle weakness, pain. Saw Ardyth Harps 12/15/22

## 2022-12-17 NOTE — ED Notes (Signed)
RN reviewed discharge instructions with pt. Pt verbalized understanding and had no further questions. VSS upon discharge.  

## 2022-12-17 NOTE — ED Triage Notes (Signed)
Pt arrives to ED with c/o right sided flank pain x4-5 days. Dx with UTI on 5/13 and prescribed Levaquin.

## 2022-12-17 NOTE — ED Provider Notes (Signed)
[place] EMERGENCY DEPARTMENT AT [facility] Provider Note   CSN: [id number] Arrival date & time: 12/17/22  1156     History  Chief Complaint  Patient presents with   Flank Pain    Christine Livingston is a 72 y.o. female.  Patient with history of frequent UTIs, hypertension, WPW presents today with complaints of right flank pain. She states that same began initially 4-5 days ago and has been persistent since. She went to her pcp on Monday and was diagnosed with a UTI and prescribed Levaquin. She has taken 2 days of this with some initial improvement in her back pain, however pain got worse today and was concerning for her. She denies associated nausea, vomiting or diarrhea.  No abdominal pain.  No hematuria.  She does also note that she has felt increased bodyaches and muscle soreness since and is concerned that she is having a reaction to the medication. States she is normally put on ciprofloxacin and requests to be switched to same. Denies fevers or chills, chest pain, shortness of breath, numbness/tingling.  The history is provided by the patient. No language interpreter was used.  Flank Pain       Home Medications Prior to Admission medications   Medication Sig Start Date End Date Taking? Authorizing Provider  amLODipine (NORVASC) 5 MG tablet Take 1 tablet by mouth once daily 11/04/22   Ardith Dark, MD  atorvastatin (LIPITOR) 40 MG tablet Take 1 tablet by mouth once daily 09/30/22   Ardith Dark, MD  Cholecalciferol (MAXIMUM D3 PO) Take by mouth. Once a week    [provider]  Cholecalciferol (VITAMIN D3) 250 MCG (10000 UT) TABS Take by mouth.    [provider]  levofloxacin (LEVAQUIN) 500 MG tablet Take 1 tablet (500 mg total) by mouth daily for 7 days. 12/15/22 12/22/22  Philip Aspen, Limmie Patricia, MD  lisinopril (ZESTRIL) 40 MG tablet Take 1 tablet by mouth once daily 09/24/22   Ardith Dark, MD  Probiotic Product (PROBIOTIC ADVANCED) CAPS  Take by mouth.    [provider]  Wheat Dextrin (BENEFIBER PO) Take by mouth daily.    [provider]      Allergies    Bactrim [sulfamethoxazole-trimethoprim], Cephalexin, and Nitrofurantoin    Review of Systems   Review of Systems  Genitourinary:  Positive for flank pain.  All other systems reviewed and are negative.   Physical Exam Updated Vital Signs BP 130/74 (BP Location: Left Arm)   Pulse 70   Temp 98.3 F (36.8 C) (Oral)   Resp 16   LMP  (LMP Unknown)   SpO2 98%  Physical Exam Vitals and nursing note reviewed.  Constitutional:      General: She is not in acute distress.    Appearance: Normal appearance. She is normal weight. She is not ill-appearing, toxic-appearing or diaphoretic.  HENT:     Head: Normocephalic and atraumatic.  Cardiovascular:     Rate and Rhythm: Normal rate.  Pulmonary:     Effort: Pulmonary effort is normal. No respiratory distress.  Abdominal:     General: Abdomen is flat.     Palpations: Abdomen is soft.     Tenderness: There is no right CVA tenderness or left CVA tenderness.  Musculoskeletal:        General: Normal range of motion.     Cervical back: Normal range of motion.  Skin:    General: Skin is warm and dry.  Neurological:  General: No focal deficit present.     Mental Status: She is alert and oriented to person, place, and time.     Cranial Nerves: No cranial nerve deficit.     Motor: No weakness.     Gait: Gait normal.  Psychiatric:        Mood and Affect: Mood normal.        Behavior: Behavior normal.     ED Results / Procedures / Treatments   Labs (all labs ordered are listed, but only abnormal results are displayed) Labs Reviewed  CBC WITH DIFFERENTIAL/PLATELET - Abnormal; Notable for the following components:      Result Value   RBC 5.57 (*)    Hemoglobin 16.1 (*)    HCT 47.4 (*)    All other components within normal limits  URINALYSIS, ROUTINE W REFLEX MICROSCOPIC - Abnormal; Notable  for the following components:   Hgb urine dipstick SMALL (*)    Leukocytes,Ua LARGE (*)    Bacteria, UA RARE (*)    All other components within normal limits  URINE CULTURE  COMPREHENSIVE METABOLIC PANEL  LIPASE, BLOOD    EKG None  Radiology CT Renal Stone Study  Result Date: 12/17/2022 CLINICAL DATA:  Abdominal/flank pain, stone suspected. Diagnosed with UTI on 05/13 and was prescribed Levaquin. EXAM: CT ABDOMEN AND PELVIS WITHOUT CONTRAST TECHNIQUE: Multidetector CT imaging of the abdomen and pelvis was performed following the standard protocol without IV contrast. RADIATION DOSE REDUCTION: This exam was performed according to the departmental dose-optimization program which includes automated exposure control, adjustment of the mA and/or kV according to patient size and/or use of iterative reconstruction technique. COMPARISON:  CT examination dated January 28, 2018 FINDINGS: Lower chest: No acute abnormality. Hepatobiliary: No focal liver abnormality is seen. No gallstones, gallbladder wall thickening, or biliary dilatation. Pancreas: Unremarkable. No pancreatic ductal dilatation or surrounding inflammatory changes. Spleen: Normal in size without focal abnormality. Adrenals/Urinary Tract: Adrenal glands are unremarkable. Kidneys are normal, without renal calculi, focal lesion, or hydronephrosis. Bladder is unremarkable. Stomach/Bowel: Stomach is within normal limits. Appendix appears normal. No evidence of bowel wall thickening, distention, or inflammatory changes. Scattered colonic diverticulosis without evidence of acute diverticulitis. Vascular/Lymphatic: Aortic atherosclerosis. No enlarged abdominal or pelvic lymph nodes. Reproductive: Status post hysterectomy. No adnexal masses. Other: No abdominal wall hernia or abnormality. No abdominopelvic ascites. 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 No acute osseous abnormality. IMPRESSION: 1. No CT evidence of  acute abdominal/pelvic process. 2. No evidence of nephrolithiasis or hydronephrosis. 3. Scattered colonic diverticulosis without evidence of acute diverticulitis. 4. Aortic atherosclerosis. 5. 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 Electronically Signed   By: Larose Hires D.O.   On: 12/17/2022 13:32    Procedures Procedures    Medications Ordered in ED Medications - No data to display  ED Course/ Medical Decision Making/ A&P                             Medical Decision Making Amount and/or Complexity of Data Reviewed Labs: ordered. Radiology: ordered.   This patient is a 72 y.o. female who presents to the ED for concern of flank pain, this involves an extensive number of treatment options, and is a complaint that carries with it a high risk of complications and morbidity. The emergent differential diagnosis prior to evaluation includes, but is not limited to,  AAA, renal artery/vein embolism/thrombosis, mesenteric ischemia, pyelonephritis, nephrolithiasis, cystitis, biliary  colic, pancreatitis, perforated peptic ulcer, appendicitis, diverticulitis, bowel obstruction  This is not an exhaustive differential.   Past Medical History / Co-morbidities / Social History: history of frequent UTIs, hypertension, WPW  Additional history: Chart reviewed. Pertinent results include: patient with most recent urine culture in 1/23 which grew Pseudomonas only resistant to imipenem.  Upon chart review, it does appear that patient is usually treated with either ciprofloxacin or Macrobid for her frequent UTIs.  Physical Exam: Physical exam performed. The pertinent findings include: Per above, abdomen soft and nontender.  No CVA tenderness.  Lab Tests: I ordered, and personally interpreted labs.  The pertinent results include: UA infections.  No other acute laboratory findings.   Imaging Studies: I ordered imaging studies including CT renal. I independently visualized and  interpreted imaging which showed   1. No CT evidence of acute abdominal/pelvic process. 2. No evidence of nephrolithiasis or hydronephrosis. 3. Scattered colonic diverticulosis without evidence of acute diverticulitis. 4. Aortic atherosclerosis. 5. 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  I agree with the radiologist interpretation.   Disposition: After consideration of the diagnostic results and the patients response to treatment, I feel that emergency department workup does not suggest an emergent condition requiring admission or immediate intervention beyond what has been performed at this time. The plan is: Discharge, will switch patient to ciprofloxacin per her request.  Recommend discontinuing Levaquin.  Will also give Robaxin for pain per her request.  Patient advised not to drive or operate heavy machinery on this medication. Evaluation and diagnostic testing in the emergency department does not suggest an emergent condition requiring admission or immediate intervention beyond what has been performed at this time.  Plan for discharge with close PCP follow-up.  Patient is understanding and amenable with plan, educated on red flag symptoms that would prompt immediate return.  Patient discharged in stable condition.   Final Clinical Impression(s) / ED Diagnoses Final diagnoses:  Acute cystitis with hematuria  Flank pain    Rx / DC Orders ED Discharge Orders          Ordered    ciprofloxacin (CIPRO) 500 MG tablet  2 times daily        12/17/22 1629    methocarbamol (ROBAXIN) 500 MG tablet  2 times daily        12/17/22 1630          An After Visit Summary was printed and given to the patient.     Silva Bandy, PA-C 12/17/22 1631    Elayne Snare K, DO 12/18/22 [id number]

## 2022-12-17 NOTE — Discharge Instructions (Addendum)
As we discussed, your symptoms are likely due to your urinary tract infection.  Given that you have had issues with the Levaquin lowers prescribed, I have given you a prescription for ciprofloxacin which you should take as prescribed in its entirety for management of your symptoms.  I have also given you prescription for Robaxin which is a muscle relaxer for you to take as prescribed as needed for pain.  Do not drive or operate heavy machinery while taking this medication as it can be sedating.  I recommend following closely with your primary care doctor as well.  Return if development of any new or worsening symptoms.

## 2022-12-17 NOTE — Telephone Encounter (Signed)
Spoke with patient and she states that she started the medication on Monday.  Her back pain has improved.  However she is having "muscle weakness and pain".  She can barely walk and has hand with arm pain.

## 2022-12-18 LAB — URINE CULTURE: Culture: NO GROWTH

## 2022-12-19 ENCOUNTER — Ambulatory Visit: Payer: Medicare PPO | Admitting: Family Medicine

## 2022-12-19 ENCOUNTER — Encounter: Payer: Self-pay | Admitting: Family Medicine

## 2022-12-19 VITALS — BP 108/68 | HR 83 | Temp 97.5°F | Ht 67.0 in | Wt 171.6 lb

## 2022-12-19 DIAGNOSIS — I10 Essential (primary) hypertension: Secondary | ICD-10-CM

## 2022-12-19 DIAGNOSIS — M546 Pain in thoracic spine: Secondary | ICD-10-CM | POA: Diagnosis not present

## 2022-12-19 DIAGNOSIS — B965 Pseudomonas (aeruginosa) (mallei) (pseudomallei) as the cause of diseases classified elsewhere: Secondary | ICD-10-CM

## 2022-12-19 DIAGNOSIS — N390 Urinary tract infection, site not specified: Secondary | ICD-10-CM | POA: Diagnosis not present

## 2022-12-19 DIAGNOSIS — M549 Dorsalgia, unspecified: Secondary | ICD-10-CM | POA: Diagnosis not present

## 2022-12-19 LAB — URINE CULTURE: MICRO NUMBER:: 14953594

## 2022-12-19 MED ORDER — MELOXICAM 15 MG PO TABS: 15.0000 mg | ORAL_TABLET | Freq: Every day | ORAL | 0 refills | Status: AC

## 2022-12-19 NOTE — Assessment & Plan Note (Signed)
Worsened recently.  Potentially worsened by recent UTI however she has been under quite a bit of stress recently as well.  Likely has some muscular component as well.  She has had modest improvement with Robaxin.  We will add on meloxicam.  She has done well with this in the past.  She will work on her home exercises as well.  She will let us know if not proving in the next 1 to 2 weeks.

## 2022-12-19 NOTE — Assessment & Plan Note (Signed)
At goal on amlodipine 5 mg daily lisinopril 40 mg daily. °

## 2022-12-19 NOTE — Progress Notes (Signed)
   Christine Livingston is a 72 y.o. female who presents today for an office visit.  Assessment/Plan:  New/Acute Problems: Pseudomonas urinary tract infection Now on cipro and symptoms are improving. No red flags or signs of systemic illness. She will complete her course of antibiotics. Encouraged hydration. We discussed reasons to return to care and seek emergent care.   Chronic Problems Addressed Today: Thoracic back pain Worsened recently.  Potentially worsened by recent UTI however she has been under quite a bit of stress recently as well.  Likely has some muscular component as well.  She has had modest improvement with Robaxin.  We will add on meloxicam.  She has done well with this in the past.  She will work on her home exercises as well.  She will let us know if not proving in the next 1 to 2 weeks.  Essential hypertension At goal on amlodipine 5 mg daily lisinopril 40 mg daily.    Subjective:  HPI:  See A/P for status of chronic conditions.  Patient is here for ED follow-up.  Went to the ED on 12/17/2022 with acute cystitis.  She was diagnosed with a urinary tract infection at another office 3 days ago. She was started on levaquin 500 mg daily. Her flank pain worsened and she went to the Emergency Department. She was having additional symptoms at that time including myaglgias and she was worried that she was having an adverse reaction to her medications. Had work up in the Emergency Department including CT renal which did not show any acute abnormalities. Her antibiotic was switched cipro and she was started on robaxin for her back pain. Pain has been improving the last couple of days.          Objective:  Physical Exam: BP 108/68   Pulse 83   Temp (!) 97.5 F (36.4 C) (Temporal)   Ht 5\' 7"  (1.702 m)   Wt 171 lb 9.6 oz (77.8 kg)   LMP  (LMP Unknown)   SpO2 96%   BMI 26.88 kg/m   Gen: No acute distress, resting comfortably CV: Regular rate and rhythm with no murmurs  appreciated Pulm: Normal work of breathing, clear to auscultation bilaterally with no crackles, wheezes, or rhonchi Neuro: Grossly normal, moves all extremities Psych: Normal affect and thought content  Time Spent: 40 minutes of total time was spent on the date of the encounter performing the following actions: chart review prior to seeing the patient including recent Emergency Department visit and visit at another office, obtaining history, performing a medically necessary exam, counseling on the treatment plan, placing orders, and documenting in our EHR.        Katina Degree. Jimmey Ralph, MD 12/19/2022 9:40 AM

## 2022-12-19 NOTE — Patient Instructions (Signed)
It was very nice to see you today!  Please finish your course of antibiotics.   Please start meloxicam. Work on the exercises.   Return if symptoms worsen or fail to improve.   Take care, Dr Jimmey Ralph  PLEASE NOTE:  If you had any lab tests, please let us know if you have not heard back within a few days. You may see your results on mychart before we have a chance to review them but we will give you a call once they are reviewed by [place].   If we ordered any referrals today, please let us know if you have not heard from their office within the next week.   If you had any urgent prescriptions sent in today, please check with the pharmacy within an hour of our visit to make sure the prescription was transmitted appropriately.   Please try these tips to maintain a healthy lifestyle:  Eat at least 3 REAL meals and 1-2 snacks per day.  Aim for no more than 5 hours between eating.  If you eat breakfast, please do so within one hour of getting up.   Each meal should contain half fruits/vegetables, one quarter protein, and one quarter carbs (no bigger than a computer mouse)  Cut down on sweet beverages. This includes juice, soda, and sweet tea.   Drink at least 1 glass of water with each meal and aim for at least 8 glasses per day  Exercise at least 150 minutes every week.

## 2022-12-30 ENCOUNTER — Other Ambulatory Visit: Payer: Self-pay | Admitting: Family Medicine

## 2022-12-31 ENCOUNTER — Other Ambulatory Visit: Payer: Self-pay | Admitting: Family Medicine

## 2022-12-31 NOTE — Telephone Encounter (Signed)
BOTH RX needs to be filled   Prescription Request  12/31/2022  LOV: 12/19/2022  What is the name of the medication or equipment?  atorvastatin (LIPITOR) 40 MG tablet   Have you contacted your pharmacy to request a refill? Yes   Which pharmacy would you like this sent to? [facility] [address], [place] - [address] [place] [address] Phone: [phone] Fax: [phone]   Patient notified that their request is being sent to the clinical staff for review and that they should receive a response within 2 business days.   Please advise at Mobile [phone] (mobile)

## 2023-01-01 ENCOUNTER — Other Ambulatory Visit: Payer: Self-pay | Admitting: *Deleted

## 2023-01-01 MED ORDER — LISINOPRIL 40 MG PO TABS: 40.0000 mg | ORAL_TABLET | Freq: Every day | ORAL | 0 refills | Status: AC

## 2023-02-04 ENCOUNTER — Other Ambulatory Visit: Payer: Self-pay | Admitting: *Deleted

## 2023-02-04 ENCOUNTER — Telehealth: Payer: Self-pay | Admitting: Family Medicine

## 2023-02-04 MED ORDER — AMLODIPINE BESYLATE 5 MG PO TABS: 5.0000 mg | ORAL_TABLET | Freq: Every day | ORAL | 0 refills | Status: AC

## 2023-02-04 NOTE — Telephone Encounter (Signed)
Prescription Request  02/04/2023  LOV: 12/19/2022  What is the name of the medication or equipment?  amLODipine (NORVASC) 5 MG tablet    Have you contacted your pharmacy to request a refill? No   Which pharmacy would you like this sent to? [facility] - [place], [place] - [id number]    Patient notified that their request is being sent to the clinical staff for review and that they should receive a response within 2 business days.   Please advise at Mobile [phone] (mobile)

## 2023-02-04 NOTE — Telephone Encounter (Signed)
Rx send to pharmacy  

## 2023-03-23 DIAGNOSIS — E785 Hyperlipidemia, unspecified: Secondary | ICD-10-CM | POA: Diagnosis not present

## 2023-03-23 DIAGNOSIS — Z87440 Personal history of urinary (tract) infections: Secondary | ICD-10-CM | POA: Diagnosis not present

## 2023-03-23 DIAGNOSIS — E559 Vitamin D deficiency, unspecified: Secondary | ICD-10-CM | POA: Diagnosis not present

## 2023-03-23 DIAGNOSIS — I10 Essential (primary) hypertension: Secondary | ICD-10-CM | POA: Diagnosis not present

## 2023-04-02 DIAGNOSIS — D2239 Melanocytic nevi of other parts of face: Secondary | ICD-10-CM | POA: Diagnosis not present

## 2023-04-02 DIAGNOSIS — L570 Actinic keratosis: Secondary | ICD-10-CM | POA: Diagnosis not present

## 2023-04-02 DIAGNOSIS — M67441 Ganglion, right hand: Secondary | ICD-10-CM | POA: Diagnosis not present

## 2023-04-02 DIAGNOSIS — Z7189 Other specified counseling: Secondary | ICD-10-CM | POA: Diagnosis not present

## 2023-04-09 DIAGNOSIS — R2231 Localized swelling, mass and lump, right upper limb: Secondary | ICD-10-CM | POA: Diagnosis not present

## 2023-05-01 DIAGNOSIS — R2231 Localized swelling, mass and lump, right upper limb: Secondary | ICD-10-CM | POA: Diagnosis not present

## 2023-05-01 DIAGNOSIS — D3610 Benign neoplasm of peripheral nerves and autonomic nervous system, unspecified: Secondary | ICD-10-CM | POA: Diagnosis not present

## 2023-05-01 DIAGNOSIS — D3612 Benign neoplasm of peripheral nerves and autonomic nervous system, upper limb, including shoulder: Secondary | ICD-10-CM | POA: Diagnosis not present

## 2023-05-13 DIAGNOSIS — R300 Dysuria: Secondary | ICD-10-CM | POA: Diagnosis not present

## 2023-05-20 DIAGNOSIS — E559 Vitamin D deficiency, unspecified: Secondary | ICD-10-CM | POA: Diagnosis not present

## 2023-05-20 DIAGNOSIS — E785 Hyperlipidemia, unspecified: Secondary | ICD-10-CM | POA: Diagnosis not present

## 2023-05-20 DIAGNOSIS — Z131 Encounter for screening for diabetes mellitus: Secondary | ICD-10-CM | POA: Diagnosis not present

## 2023-05-20 DIAGNOSIS — I10 Essential (primary) hypertension: Secondary | ICD-10-CM | POA: Diagnosis not present

## 2023-05-20 DIAGNOSIS — R42 Dizziness and giddiness: Secondary | ICD-10-CM | POA: Diagnosis not present

## 2023-06-25 DIAGNOSIS — D2261 Melanocytic nevi of right upper limb, including shoulder: Secondary | ICD-10-CM | POA: Diagnosis not present

## 2023-06-25 DIAGNOSIS — D485 Neoplasm of uncertain behavior of skin: Secondary | ICD-10-CM | POA: Diagnosis not present

## 2023-06-25 DIAGNOSIS — D2239 Melanocytic nevi of other parts of face: Secondary | ICD-10-CM | POA: Diagnosis not present

## 2023-06-25 DIAGNOSIS — Z7189 Other specified counseling: Secondary | ICD-10-CM | POA: Diagnosis not present

## 2023-06-25 DIAGNOSIS — D225 Melanocytic nevi of trunk: Secondary | ICD-10-CM | POA: Diagnosis not present

## 2023-06-25 DIAGNOSIS — L820 Inflamed seborrheic keratosis: Secondary | ICD-10-CM | POA: Diagnosis not present

## 2023-06-25 DIAGNOSIS — D3611 Benign neoplasm of peripheral nerves and autonomic nervous system of face, head, and neck: Secondary | ICD-10-CM | POA: Diagnosis not present

## 2023-06-25 DIAGNOSIS — L814 Other melanin hyperpigmentation: Secondary | ICD-10-CM | POA: Diagnosis not present

## 2023-06-25 DIAGNOSIS — L821 Other seborrheic keratosis: Secondary | ICD-10-CM | POA: Diagnosis not present

## 2023-06-25 DIAGNOSIS — L570 Actinic keratosis: Secondary | ICD-10-CM | POA: Diagnosis not present

## 2023-06-25 DIAGNOSIS — D2271 Melanocytic nevi of right lower limb, including hip: Secondary | ICD-10-CM | POA: Diagnosis not present

## 2023-08-10 DIAGNOSIS — H43393 Other vitreous opacities, bilateral: Secondary | ICD-10-CM | POA: Diagnosis not present

## 2023-08-10 DIAGNOSIS — H5213 Myopia, bilateral: Secondary | ICD-10-CM | POA: Diagnosis not present

## 2023-09-10 DIAGNOSIS — J029 Acute pharyngitis, unspecified: Secondary | ICD-10-CM | POA: Diagnosis not present

## 2023-09-30 DIAGNOSIS — R8290 Unspecified abnormal findings in urine: Secondary | ICD-10-CM | POA: Diagnosis not present

## 2023-09-30 DIAGNOSIS — Z87440 Personal history of urinary (tract) infections: Secondary | ICD-10-CM | POA: Diagnosis not present

## 2023-09-30 DIAGNOSIS — R350 Frequency of micturition: Secondary | ICD-10-CM | POA: Diagnosis not present

## 2023-10-08 DIAGNOSIS — M545 Low back pain, unspecified: Secondary | ICD-10-CM | POA: Diagnosis not present

## 2023-10-08 DIAGNOSIS — S39012A Strain of muscle, fascia and tendon of lower back, initial encounter: Secondary | ICD-10-CM | POA: Diagnosis not present

## 2023-10-12 DIAGNOSIS — D485 Neoplasm of uncertain behavior of skin: Secondary | ICD-10-CM | POA: Diagnosis not present

## 2023-10-12 DIAGNOSIS — L905 Scar conditions and fibrosis of skin: Secondary | ICD-10-CM | POA: Diagnosis not present

## 2023-11-25 DIAGNOSIS — E785 Hyperlipidemia, unspecified: Secondary | ICD-10-CM | POA: Diagnosis not present

## 2023-11-25 DIAGNOSIS — I10 Essential (primary) hypertension: Secondary | ICD-10-CM | POA: Diagnosis not present

## 2023-11-25 DIAGNOSIS — E559 Vitamin D deficiency, unspecified: Secondary | ICD-10-CM | POA: Diagnosis not present

## 2023-11-25 DIAGNOSIS — R7303 Prediabetes: Secondary | ICD-10-CM | POA: Diagnosis not present

## 2023-12-02 DIAGNOSIS — Z1231 Encounter for screening mammogram for malignant neoplasm of breast: Secondary | ICD-10-CM | POA: Diagnosis not present
